# Patient Record
Sex: Female | Born: 1940
Health system: Southern US, Community
[De-identification: ages and names within clinical notes are randomized; demographics above are authoritative.]

## PROBLEM LIST (undated history)

## (undated) ENCOUNTER — Emergency Department (HOSPITAL_COMMUNITY): Admission: EM | Payer: Self-pay

## (undated) DIAGNOSIS — K449 Diaphragmatic hernia without obstruction or gangrene: Secondary | ICD-10-CM

## (undated) DIAGNOSIS — Z9981 Dependence on supplemental oxygen: Secondary | ICD-10-CM

## (undated) DIAGNOSIS — E7800 Pure hypercholesterolemia, unspecified: Secondary | ICD-10-CM

## (undated) DIAGNOSIS — J449 Chronic obstructive pulmonary disease, unspecified: Secondary | ICD-10-CM

## (undated) DIAGNOSIS — M899 Disorder of bone, unspecified: Secondary | ICD-10-CM

## (undated) DIAGNOSIS — Z9181 History of falling: Secondary | ICD-10-CM

## (undated) DIAGNOSIS — K635 Polyp of colon: Secondary | ICD-10-CM

## (undated) DIAGNOSIS — H409 Unspecified glaucoma: Secondary | ICD-10-CM

## (undated) DIAGNOSIS — M949 Disorder of cartilage, unspecified: Secondary | ICD-10-CM

## (undated) DIAGNOSIS — E079 Disorder of thyroid, unspecified: Secondary | ICD-10-CM

## (undated) DIAGNOSIS — F329 Major depressive disorder, single episode, unspecified: Secondary | ICD-10-CM

## (undated) DIAGNOSIS — F419 Anxiety disorder, unspecified: Secondary | ICD-10-CM

## (undated) DIAGNOSIS — F17200 Nicotine dependence, unspecified, uncomplicated: Secondary | ICD-10-CM

## (undated) HISTORY — DX: Disorder of bone, unspecified: M89.9

## (undated) HISTORY — PX: CHOLECYSTECTOMY: SHX55

## (undated) HISTORY — DX: History of falling: Z91.81

## (undated) HISTORY — PX: TONSILLECTOMY: SUR1361

## (undated) HISTORY — DX: Major depressive disorder, single episode, unspecified: F32.9

## (undated) HISTORY — PX: BLADDER AUGMENTATION: SHX1233

## (undated) HISTORY — PX: APPENDECTOMY: SHX54

## (undated) HISTORY — DX: Disorder of cartilage, unspecified: M94.9

## (undated) HISTORY — PX: ABDOMINAL HYSTERECTOMY: SHX81

## (undated) HISTORY — DX: Nicotine dependence, unspecified, uncomplicated: F17.200

## (undated) HISTORY — DX: Pure hypercholesterolemia, unspecified: E78.00

## (undated) HISTORY — PX: BACK SURGERY: SHX140

## (undated) HISTORY — DX: Polyp of colon: K63.5

---

## 2011-06-09 DIAGNOSIS — Z23 Encounter for immunization: Secondary | ICD-10-CM | POA: Diagnosis not present

## 2011-06-30 DIAGNOSIS — Z0000 Encounter for general adult medical examination without abnormal findings: Secondary | ICD-10-CM | POA: Diagnosis not present

## 2011-06-30 DIAGNOSIS — Z79899 Other long term (current) drug therapy: Secondary | ICD-10-CM | POA: Diagnosis not present

## 2011-06-30 DIAGNOSIS — E039 Hypothyroidism, unspecified: Secondary | ICD-10-CM | POA: Diagnosis not present

## 2011-06-30 DIAGNOSIS — Z0184 Encounter for antibody response examination: Secondary | ICD-10-CM | POA: Diagnosis not present

## 2011-06-30 DIAGNOSIS — F17200 Nicotine dependence, unspecified, uncomplicated: Secondary | ICD-10-CM | POA: Diagnosis not present

## 2011-06-30 DIAGNOSIS — J449 Chronic obstructive pulmonary disease, unspecified: Secondary | ICD-10-CM | POA: Diagnosis not present

## 2011-06-30 DIAGNOSIS — E7800 Pure hypercholesterolemia, unspecified: Secondary | ICD-10-CM | POA: Diagnosis not present

## 2011-08-10 DIAGNOSIS — H4011X0 Primary open-angle glaucoma, stage unspecified: Secondary | ICD-10-CM | POA: Diagnosis not present

## 2011-09-14 DIAGNOSIS — H2510 Age-related nuclear cataract, unspecified eye: Secondary | ICD-10-CM | POA: Diagnosis not present

## 2011-11-01 DIAGNOSIS — H2510 Age-related nuclear cataract, unspecified eye: Secondary | ICD-10-CM | POA: Diagnosis not present

## 2011-11-01 DIAGNOSIS — H269 Unspecified cataract: Secondary | ICD-10-CM | POA: Diagnosis not present

## 2011-11-02 DIAGNOSIS — H2510 Age-related nuclear cataract, unspecified eye: Secondary | ICD-10-CM | POA: Diagnosis not present

## 2011-11-22 DIAGNOSIS — H269 Unspecified cataract: Secondary | ICD-10-CM | POA: Diagnosis not present

## 2011-11-22 DIAGNOSIS — H2510 Age-related nuclear cataract, unspecified eye: Secondary | ICD-10-CM | POA: Diagnosis not present

## 2011-11-30 DIAGNOSIS — H2510 Age-related nuclear cataract, unspecified eye: Secondary | ICD-10-CM | POA: Diagnosis not present

## 2011-12-29 DIAGNOSIS — F341 Dysthymic disorder: Secondary | ICD-10-CM | POA: Diagnosis not present

## 2011-12-29 DIAGNOSIS — E039 Hypothyroidism, unspecified: Secondary | ICD-10-CM | POA: Diagnosis not present

## 2011-12-29 DIAGNOSIS — J449 Chronic obstructive pulmonary disease, unspecified: Secondary | ICD-10-CM | POA: Diagnosis not present

## 2011-12-29 DIAGNOSIS — R450 Nervousness: Secondary | ICD-10-CM | POA: Diagnosis not present

## 2011-12-29 DIAGNOSIS — M79609 Pain in unspecified limb: Secondary | ICD-10-CM | POA: Diagnosis not present

## 2012-02-05 DIAGNOSIS — Z23 Encounter for immunization: Secondary | ICD-10-CM | POA: Diagnosis not present

## 2012-02-09 DIAGNOSIS — F341 Dysthymic disorder: Secondary | ICD-10-CM | POA: Diagnosis not present

## 2012-04-01 ENCOUNTER — Encounter (HOSPITAL_COMMUNITY): Payer: Self-pay | Admitting: Vascular Surgery

## 2012-04-01 ENCOUNTER — Emergency Department (HOSPITAL_COMMUNITY)
Admission: EM | Admit: 2012-04-01 | Discharge: 2012-04-02 | Disposition: A | Discharge status: Home / Self Care | Payer: Medicare Other | Attending: Emergency Medicine | Admitting: Emergency Medicine

## 2012-04-01 ENCOUNTER — Other Ambulatory Visit: Payer: Self-pay

## 2012-04-01 ENCOUNTER — Emergency Department (HOSPITAL_COMMUNITY): Payer: Medicare Other

## 2012-04-01 DIAGNOSIS — Z87891 Personal history of nicotine dependence: Secondary | ICD-10-CM | POA: Insufficient documentation

## 2012-04-01 DIAGNOSIS — E039 Hypothyroidism, unspecified: Secondary | ICD-10-CM | POA: Diagnosis not present

## 2012-04-01 DIAGNOSIS — R079 Chest pain, unspecified: Secondary | ICD-10-CM

## 2012-04-01 DIAGNOSIS — R071 Chest pain on breathing: Secondary | ICD-10-CM | POA: Insufficient documentation

## 2012-04-01 DIAGNOSIS — H409 Unspecified glaucoma: Secondary | ICD-10-CM | POA: Insufficient documentation

## 2012-04-01 DIAGNOSIS — Z79899 Other long term (current) drug therapy: Secondary | ICD-10-CM | POA: Insufficient documentation

## 2012-04-01 DIAGNOSIS — Z7982 Long term (current) use of aspirin: Secondary | ICD-10-CM | POA: Diagnosis not present

## 2012-04-01 DIAGNOSIS — J4489 Other specified chronic obstructive pulmonary disease: Secondary | ICD-10-CM | POA: Insufficient documentation

## 2012-04-01 DIAGNOSIS — Z8719 Personal history of other diseases of the digestive system: Secondary | ICD-10-CM | POA: Diagnosis not present

## 2012-04-01 DIAGNOSIS — R0789 Other chest pain: Secondary | ICD-10-CM | POA: Diagnosis not present

## 2012-04-01 DIAGNOSIS — Z8659 Personal history of other mental and behavioral disorders: Secondary | ICD-10-CM | POA: Insufficient documentation

## 2012-04-01 DIAGNOSIS — J449 Chronic obstructive pulmonary disease, unspecified: Secondary | ICD-10-CM | POA: Insufficient documentation

## 2012-04-01 HISTORY — DX: Diaphragmatic hernia without obstruction or gangrene: K44.9

## 2012-04-01 HISTORY — DX: Anxiety disorder, unspecified: F41.9

## 2012-04-01 HISTORY — DX: Chronic obstructive pulmonary disease, unspecified: J44.9

## 2012-04-01 HISTORY — DX: Unspecified glaucoma: H40.9

## 2012-04-01 HISTORY — DX: Disorder of thyroid, unspecified: E07.9

## 2012-04-01 LAB — COMPREHENSIVE METABOLIC PANEL
AST: 18 U/L (ref 0–37)
Albumin: 3.6 g/dL (ref 3.5–5.2)
CO2: 26 mEq/L (ref 19–32)
Calcium: 9 mg/dL (ref 8.4–10.5)
Creatinine, Ser: 0.54 mg/dL (ref 0.50–1.10)
GFR calc non Af Amer: >90 mL/min (ref >90)

## 2012-04-01 LAB — CBC WITH DIFFERENTIAL/PLATELET
Eosinophils Absolute: 0.4 10*3/uL (ref 0.0–0.7)
Hemoglobin: 12.4 g/dL (ref 12.0–15.0)
Lymphs Abs: 1.7 10*3/uL (ref 0.7–4.0)
MCH: 30.6 pg (ref 26.0–34.0)
Monocytes Relative: 7 % (ref 3–12)
Neutrophils Relative %: 70 % (ref 43–77)
RBC: 4.05 MIL/uL (ref 3.87–5.11)

## 2012-04-01 NOTE — ED Notes (Addendum)
Pt denies c/o. Sob, blurred vision, n/v pt states while ambulating she gets "lightheaded"

## 2012-04-01 NOTE — ED Provider Notes (Signed)
This is a 71 year old female, who presents to the emergency department with chief complaint of chest pain. The patient has been transferred to the CDU and signed out to me by Dr. Preston Fleeting. The plan is for the patient to receive serial troponins, and then to be discharged if the blood work is negative at 0, 3, and 6 hours.  Patient states that she is not in any pain. She is resting comfortably. She denies any complaints whatsoever at this time.  PE: Gen: A&O x4 HEENT: PERRL, EOM CHEST: RRR, no m/r/g LUNGS: CTAB, no w/r/r ABD: BS x 4, ND/NT EXT: No edema, strong peripheral pulses NEURO: Sensation and strength intact bilaterally  12:10 AM Patient has been signed out to Dr. Lorenso Courier, who will continue care at this time.   Roxy Horseman, PA-C 04/02/12 0010

## 2012-04-01 NOTE — ED Notes (Signed)
Pt reports to the ED via [facility] for eval of chest burning that began after dinner. States that the pain went down both of her arms. Upon EMS arrival states that her pain had resolved. Pt also denies pain at this time. Denies and SOB, N/V, diaphoresis, she does report dizziness and lightheadedness. Per EMS she had ST depression in leads 3-6. Pt had 324 of ASA and was on 2 L of oxygen upon arrival.

## 2012-04-01 NOTE — ED Provider Notes (Signed)
History     CSN: [id number]  Arrival date & time 04/01/12  2036   First MD Initiated Contact with Patient 04/01/12 2046      Chief Complaint  Patient presents with  . Chest Pain    (Consider location/radiation/quality/duration/timing/severity/associated sxs/prior treatment) Patient is a 71 y.o. female presenting with chest pain. The history is provided by the patient.  Chest Pain   She had onset about one hour ago of a burning pain in the anterior chest. Onset was while she was watching television. Pain came in waves and did radiate into both arms but not the back or neck or jaw. There is no associated dyspnea, nausea, diaphoresis. She did complain of some momentary dizziness. She took aspirin 325 mg and following that, all symptoms went away completely. She's not had any recurrence of pain. At its worst, pain was rated at 6/10. She quit smoking 5 months ago. There is a strong family history of coronary disease with her father having died of a heart attack in his late 86s. She denies history of diabetes or hypertension or hyperlipidemia, but does have history of COPD.  Past Medical History  Diagnosis Date  . Thyroid disease hypothyroidism  . COPD (chronic obstructive pulmonary disease)   . Glaucoma   . Anxiety   . Hiatal hernia     Past Surgical History  Procedure Date  . Tonsillectomy   . Appendectomy   . Cholecystectomy   . Abdominal hysterectomy   . Bladder augmentation     Family History  Problem Relation Age of Onset  . Dementia Mother   . Heart attack Father     History  Substance Use Topics  . Smoking status: Former Smoker    Types: Cigarettes    Quit date: 11/22/2011  . Smokeless tobacco: Not on file  . Alcohol Use: No    OB History    Grav Para Term Preterm Abortions TAB SAB Ect Mult Living                  Review of Systems  Cardiovascular: Positive for chest pain.  All other systems reviewed and are negative.    Allergies  Codeine; Sulfa  antibiotics; and Penicillins  Home Medications   Current Outpatient Rx  Name  Route  Sig  Dispense  Refill  . ASPIRIN 325 MG PO TABS   Oral   Take 325 mg by mouth once.         . ASPIRIN EC 81 MG PO TBEC   Oral   Take 81 mg by mouth daily.         Marland Kitchen CITALOPRAM HYDROBROMIDE 20 MG PO TABS   Oral   Take 20 mg by mouth daily.         . IPRATROPIUM-ALBUTEROL 0.5-2.5 (3) MG/3ML IN SOLN   Nebulization   Take 3 mLs by nebulization every 8 (eight) hours as needed. For difficulty breathing         . LATANOPROST 0.005 % OP SOLN   Both Eyes   Place 1 drop into both eyes at bedtime.         Marland Kitchen LEVOTHYROXINE SODIUM 88 MCG PO TABS   Oral   Take 88 mcg by mouth daily.         Marygrace Drought WOMENS 50+ ADVANTAGE PO   Oral   Take 1 tablet by mouth daily.           BP 129/74  Pulse 76  Temp 97.9 F (  36.6 C) (Oral)  Resp 20  SpO2 97%  Physical Exam  Nursing note and vitals reviewed. 71 year old female, resting comfortably and in no acute distress. Vital signs are normal. Oxygen saturation is 97%, which is normal. Head is normocephalic and atraumatic. PERRLA, EOMI. Oropharynx is clear. Neck is nontender and supple without adenopathy or JVD. Back is nontender and there is no CVA tenderness. Lungs have decreased breath sounds at the right base and slightly decreased breath sounds throughout the right lung compared with the left. There are no rales, wheezes, or rhonchi. Chest is nontender. Heart has regular rate and rhythm without murmur. Abdomen is soft, flat, nontender without masses or hepatosplenomegaly and peristalsis is normoactive. Extremities have no cyanosis or edema, full range of motion is present. Skin is warm and dry without rash. Neurologic: Mental status is normal, cranial nerves are intact, there are no motor or sensory deficits.   ED Course  Procedures (including critical care time)  Results for orders placed during the hospital encounter of 04/01/12    COMPREHENSIVE METABOLIC PANEL      Component Value Range   Sodium 138  135 - 145 mEq/L   Potassium 3.5  3.5 - 5.1 mEq/L   Chloride 104  96 - 112 mEq/L   CO2 26  19 - 32 mEq/L   Glucose, Bld 110 (*) 70 - 99 mg/dL   BUN 18  6 - 23 mg/dL   Creatinine, Ser 1.61  0.50 - 1.10 mg/dL   Calcium 9.0  8.4 - 09.6 mg/dL   Total Protein 6.6  6.0 - 8.3 g/dL   Albumin 3.6  3.5 - 5.2 g/dL   AST 18  0 - 37 U/L   ALT 12  0 - 35 U/L   Alkaline Phosphatase 76  39 - 117 U/L   Total Bilirubin 0.2 (*) 0.3 - 1.2 mg/dL   GFR calc non Af Amer >90  >90 mL/min   GFR calc Af Amer >90  >90 mL/min  CBC WITH DIFFERENTIAL      Component Value Range   WBC 9.2  4.0 - 10.5 K/uL   RBC 4.05  3.87 - 5.11 MIL/uL   Hemoglobin 12.4  12.0 - 15.0 g/dL   HCT 04.5  40.9 - 81.1 %   MCV 92.1  78.0 - 100.0 fL   MCH 30.6  26.0 - 34.0 pg   MCHC 33.2  30.0 - 36.0 g/dL   RDW 91.4  78.2 - 95.6 %   Platelets 212  150 - 400 K/uL   Neutrophils Relative 70  43 - 77 %   Neutro Abs 6.5  1.7 - 7.7 K/uL   Lymphocytes Relative 19  12 - 46 %   Lymphs Abs 1.7  0.7 - 4.0 K/uL   Monocytes Relative 7  3 - 12 %   Monocytes Absolute 0.7  0.1 - 1.0 K/uL   Eosinophils Relative 4  0 - 5 %   Eosinophils Absolute 0.4  0.0 - 0.7 K/uL   Basophils Relative 1  0 - 1 %   Basophils Absolute 0.1  0.0 - 0.1 K/uL  LIPASE, BLOOD      Component Value Range   Lipase 25  11 - 59 U/L  TROPONIN I      Component Value Range   Troponin I <0.30  <0.30 ng/mL  TROPONIN I      Component Value Range   Troponin I <0.30  <0.30 ng/mL  TROPONIN I  Component Value Range   Troponin I <0.30  <0.30 ng/mL   Dg Chest 2 View  04/01/2012  *RADIOLOGY REPORT*  Clinical Data: Burning pain within the mid chest for 2 hours  CHEST - 2 VIEW  Comparison: None.  Findings: Normal cardiac silhouette and mediastinal contours.  The lungs are hyperexpanded with flattening of bilateral hemidiaphragms.  Grossly symmetric biapical pleural parenchymal thickening.  Mild diffuse  thickening of the pulmonary interstitium. No evidence of pulmonary edema.  No focal airspace opacities.  No pleural effusion or pneumothorax.  Accentuated thoracic kyphosis. Post cholecystectomy.  IMPRESSION: Hyperexpanded lungs without acute cardiopulmonary disease.   Original Report Authenticated By: Tacey Ruiz, MD       Date: 04/01/2012  Rate: 79  Rhythm: normal sinus rhythm  QRS Axis: normal  Intervals: normal  ST/T Wave abnormalities: nonspecific ST changes  Conduction Disutrbances:none  Narrative Interpretation: Minor nonspecific ST changes. No prior ECG available for comparison.  Old EKG Reviewed: none available    1. Chest pain       MDM   chest pain which might be cardiac in. Since she is pain-free he does not show any ECG changes, she will be watched in the ED and 6 serial troponins checked. If troponin is negative after 6 hours, she will be referred to [facility] cardiology for outpatient workup.        Dione Booze, MD 04/03/12 [phone]

## 2012-04-02 NOTE — ED Provider Notes (Signed)
Received sign-over at midnight.  Pt was awaiting the results of a 3rd troponin for evaluation of chest pain.  The 3rd trop is also not elevated.  She is pain free.  Plan discharge home to follow-up closely as an outpt.  Tobin Chad, MD 04/02/12 [phone]

## 2012-04-02 NOTE — ED Provider Notes (Signed)
Medical screening examination/treatment/procedure(s) were conducted as a shared visit with non-physician practitioner(s) and myself.  I personally evaluated the patient during the encounter   Dione Booze, MD 04/02/12 [phone]

## 2012-04-05 DIAGNOSIS — J449 Chronic obstructive pulmonary disease, unspecified: Secondary | ICD-10-CM | POA: Diagnosis not present

## 2012-04-05 DIAGNOSIS — F341 Dysthymic disorder: Secondary | ICD-10-CM | POA: Diagnosis not present

## 2012-04-05 DIAGNOSIS — K449 Diaphragmatic hernia without obstruction or gangrene: Secondary | ICD-10-CM | POA: Diagnosis not present

## 2012-04-05 DIAGNOSIS — R079 Chest pain, unspecified: Secondary | ICD-10-CM | POA: Diagnosis not present

## 2012-04-25 DIAGNOSIS — H4011X0 Primary open-angle glaucoma, stage unspecified: Secondary | ICD-10-CM | POA: Diagnosis not present

## 2012-06-08 DIAGNOSIS — K5289 Other specified noninfective gastroenteritis and colitis: Secondary | ICD-10-CM | POA: Diagnosis not present

## 2012-08-10 DIAGNOSIS — H4011X0 Primary open-angle glaucoma, stage unspecified: Secondary | ICD-10-CM | POA: Diagnosis not present

## 2012-09-12 DIAGNOSIS — S8000XA Contusion of unspecified knee, initial encounter: Secondary | ICD-10-CM | POA: Diagnosis not present

## 2012-09-12 DIAGNOSIS — S7000XA Contusion of unspecified hip, initial encounter: Secondary | ICD-10-CM | POA: Diagnosis not present

## 2012-11-03 DIAGNOSIS — E039 Hypothyroidism, unspecified: Secondary | ICD-10-CM | POA: Diagnosis not present

## 2012-11-03 DIAGNOSIS — F329 Major depressive disorder, single episode, unspecified: Secondary | ICD-10-CM | POA: Diagnosis not present

## 2012-11-03 DIAGNOSIS — Z780 Asymptomatic menopausal state: Secondary | ICD-10-CM | POA: Diagnosis not present

## 2012-11-03 DIAGNOSIS — Z131 Encounter for screening for diabetes mellitus: Secondary | ICD-10-CM | POA: Diagnosis not present

## 2012-11-03 DIAGNOSIS — J449 Chronic obstructive pulmonary disease, unspecified: Secondary | ICD-10-CM | POA: Diagnosis not present

## 2012-11-03 DIAGNOSIS — Z87891 Personal history of nicotine dependence: Secondary | ICD-10-CM | POA: Diagnosis not present

## 2012-11-03 DIAGNOSIS — Z9181 History of falling: Secondary | ICD-10-CM | POA: Diagnosis not present

## 2012-11-03 DIAGNOSIS — Z0000 Encounter for general adult medical examination without abnormal findings: Secondary | ICD-10-CM | POA: Diagnosis not present

## 2012-11-03 DIAGNOSIS — E7800 Pure hypercholesterolemia, unspecified: Secondary | ICD-10-CM | POA: Diagnosis not present

## 2012-11-09 DIAGNOSIS — H4011X0 Primary open-angle glaucoma, stage unspecified: Secondary | ICD-10-CM | POA: Diagnosis not present

## 2012-11-15 ENCOUNTER — Other Ambulatory Visit: Payer: Self-pay

## 2012-11-15 DIAGNOSIS — Z1231 Encounter for screening mammogram for malignant neoplasm of breast: Secondary | ICD-10-CM

## 2012-11-28 ENCOUNTER — Ambulatory Visit
Admission: RE | Admit: 2012-11-28 | Discharge: 2012-11-28 | Disposition: A | Discharge status: Home / Self Care | Payer: Medicare Other | Source: Ambulatory Visit

## 2012-11-28 DIAGNOSIS — Z1231 Encounter for screening mammogram for malignant neoplasm of breast: Secondary | ICD-10-CM

## 2012-12-07 DIAGNOSIS — Z780 Asymptomatic menopausal state: Secondary | ICD-10-CM | POA: Diagnosis not present

## 2012-12-07 DIAGNOSIS — M899 Disorder of bone, unspecified: Secondary | ICD-10-CM | POA: Diagnosis not present

## 2012-12-07 DIAGNOSIS — E2839 Other primary ovarian failure: Secondary | ICD-10-CM | POA: Diagnosis not present

## 2012-12-15 DIAGNOSIS — K644 Residual hemorrhoidal skin tags: Secondary | ICD-10-CM | POA: Diagnosis not present

## 2012-12-15 DIAGNOSIS — Z09 Encounter for follow-up examination after completed treatment for conditions other than malignant neoplasm: Secondary | ICD-10-CM | POA: Diagnosis not present

## 2012-12-15 DIAGNOSIS — Z86010 Personal history of colonic polyps: Secondary | ICD-10-CM | POA: Diagnosis not present

## 2012-12-15 DIAGNOSIS — K5730 Diverticulosis of large intestine without perforation or abscess without bleeding: Secondary | ICD-10-CM | POA: Diagnosis not present

## 2013-01-17 DIAGNOSIS — J449 Chronic obstructive pulmonary disease, unspecified: Secondary | ICD-10-CM | POA: Diagnosis not present

## 2013-01-17 DIAGNOSIS — M899 Disorder of bone, unspecified: Secondary | ICD-10-CM | POA: Diagnosis not present

## 2013-01-17 DIAGNOSIS — E039 Hypothyroidism, unspecified: Secondary | ICD-10-CM | POA: Diagnosis not present

## 2013-01-17 DIAGNOSIS — F329 Major depressive disorder, single episode, unspecified: Secondary | ICD-10-CM | POA: Diagnosis not present

## 2013-01-17 DIAGNOSIS — Z23 Encounter for immunization: Secondary | ICD-10-CM | POA: Diagnosis not present

## 2013-01-17 DIAGNOSIS — Z87891 Personal history of nicotine dependence: Secondary | ICD-10-CM | POA: Diagnosis not present

## 2013-02-07 DIAGNOSIS — H4011X0 Primary open-angle glaucoma, stage unspecified: Secondary | ICD-10-CM | POA: Diagnosis not present

## 2013-02-16 DIAGNOSIS — R300 Dysuria: Secondary | ICD-10-CM | POA: Diagnosis not present

## 2013-02-16 DIAGNOSIS — N390 Urinary tract infection, site not specified: Secondary | ICD-10-CM | POA: Diagnosis not present

## 2013-05-08 DIAGNOSIS — F329 Major depressive disorder, single episode, unspecified: Secondary | ICD-10-CM | POA: Diagnosis not present

## 2013-05-08 DIAGNOSIS — M899 Disorder of bone, unspecified: Secondary | ICD-10-CM | POA: Diagnosis not present

## 2013-05-08 DIAGNOSIS — J449 Chronic obstructive pulmonary disease, unspecified: Secondary | ICD-10-CM | POA: Diagnosis not present

## 2013-05-08 DIAGNOSIS — Z87891 Personal history of nicotine dependence: Secondary | ICD-10-CM | POA: Diagnosis not present

## 2013-05-08 DIAGNOSIS — E039 Hypothyroidism, unspecified: Secondary | ICD-10-CM | POA: Diagnosis not present

## 2013-05-08 DIAGNOSIS — H409 Unspecified glaucoma: Secondary | ICD-10-CM | POA: Diagnosis not present

## 2013-07-04 DIAGNOSIS — H4011X0 Primary open-angle glaucoma, stage unspecified: Secondary | ICD-10-CM | POA: Diagnosis not present

## 2013-08-16 DIAGNOSIS — J449 Chronic obstructive pulmonary disease, unspecified: Secondary | ICD-10-CM | POA: Diagnosis not present

## 2013-08-16 DIAGNOSIS — G250 Essential tremor: Secondary | ICD-10-CM | POA: Diagnosis not present

## 2013-08-16 DIAGNOSIS — R279 Unspecified lack of coordination: Secondary | ICD-10-CM | POA: Diagnosis not present

## 2013-08-16 DIAGNOSIS — R259 Unspecified abnormal involuntary movements: Secondary | ICD-10-CM | POA: Diagnosis not present

## 2013-08-16 DIAGNOSIS — R29818 Other symptoms and signs involving the nervous system: Secondary | ICD-10-CM | POA: Diagnosis not present

## 2013-08-16 DIAGNOSIS — E039 Hypothyroidism, unspecified: Secondary | ICD-10-CM | POA: Diagnosis not present

## 2013-08-16 DIAGNOSIS — F329 Major depressive disorder, single episode, unspecified: Secondary | ICD-10-CM | POA: Diagnosis not present

## 2013-08-21 ENCOUNTER — Encounter (INDEPENDENT_AMBULATORY_CARE_PROVIDER_SITE_OTHER): Payer: Self-pay

## 2013-08-21 ENCOUNTER — Ambulatory Visit (INDEPENDENT_AMBULATORY_CARE_PROVIDER_SITE_OTHER): Payer: Medicare Other | Admitting: Neurology

## 2013-08-21 ENCOUNTER — Encounter: Payer: Self-pay | Admitting: Neurology

## 2013-08-21 VITALS — BP 141/72 | HR 86 | Temp 97.1°F | Ht 63.0 in | Wt 154.0 lb

## 2013-08-21 DIAGNOSIS — R251 Tremor, unspecified: Secondary | ICD-10-CM

## 2013-08-21 DIAGNOSIS — R29818 Other symptoms and signs involving the nervous system: Secondary | ICD-10-CM | POA: Diagnosis not present

## 2013-08-21 DIAGNOSIS — R2689 Other abnormalities of gait and mobility: Secondary | ICD-10-CM

## 2013-08-21 DIAGNOSIS — Z9181 History of falling: Secondary | ICD-10-CM

## 2013-08-21 DIAGNOSIS — R259 Unspecified abnormal involuntary movements: Secondary | ICD-10-CM

## 2013-08-21 DIAGNOSIS — J4489 Other specified chronic obstructive pulmonary disease: Secondary | ICD-10-CM

## 2013-08-21 DIAGNOSIS — J449 Chronic obstructive pulmonary disease, unspecified: Secondary | ICD-10-CM | POA: Diagnosis not present

## 2013-08-21 NOTE — Patient Instructions (Addendum)
  Please remember, that any kind of tremor may be exacerbated by anxiety, anger, nervousness, excitement, dehydration, sleep deprivation, by caffeine, and low blood sugar values or blood sugar fluctuations. Some medications, especially some antidepressants and lithium can cause or exacerbate tremors. Tremors may temporarily calm down her subside with the use of a benzodiazepine such as Valium or related medications and with alcohol. Be aware however that drinking alcohol is not an approved treatment or appropriate treatment for tremor control and long-term use of benzodiazepines such as Valium, lorazepam, alprazolam, or clonazepam can cause habit formation, physical and psychological addiction.  Please reduce your caffeine intake. Start using a cane for safety. We will do a brain MRI and call you with the test results.

## 2013-08-21 NOTE — Progress Notes (Signed)
Subjective:    Patient ID: Brittany Crosby is a 73 y.o. female.  HPI    Star Age, MD, PhD [facility] [address], [address] [place], [place] [address]  Dear Dr. Sabra Heck,   I saw your patient, Brittany Crosby, upon your kind request in my neurologic clinic today for initial consultation of her tremors. The patient is unaccompanied today. As you know, [name] is a 73 year old right-handed woman with an underlying medical history of lower back pain with history of herniated disc, hyperlipidemia, glaucoma, cataracts, hiatal hernia, depression, anxiety, hypertension, osteopenia, and COPD in the context of prior smoking (quit some 3 years ago),  status post tonsillectomy, appendectomy, cholecystectomy, bladder surgery and abdominal hysterectomy, who reports a one year history of gradual onset and progressive R hand tremor, affecting her handwriting (which has become smaller) and her feeding skills. Some months later, she noted a R foot tremor. She has difficulty getting out of a chair. She feels, her balance is worse. She has fallen about 4 times in the last year, but only went to the doctor after the second fall. No LOC, no head injury is reported. She reports no FHx of PD, but MGM had MS and daughter has dystonia. Her mother had a head tremor. She denies exposure to pesticides or other harsh chemicals. She worked in [employer]. She does not drink alcohol. She does drink sweet tea and 3 cans of cola per day. She has been on Celexa for the past 2 years. She has no history of neuroleptic medication use or Reglan. She had blood work in your office on 08/16/13: CBC with diff, TSH, ESR and B12 with results pending. She has never had a brain scan.  Her Past Medical History Is Significant For: Past Medical History  Diagnosis Date  . Thyroid disease hypothyroidism  . COPD (chronic obstructive pulmonary disease)   . Glaucoma   . Anxiety   . Hiatal hernia    . Disorder of bone and cartilage, unspecified   . Major depressive disorder, single episode, unspecified    Her Past Surgical History Is Significant For: Past Surgical History  Procedure Laterality Date  . Tonsillectomy    . Appendectomy    . Cholecystectomy    . Abdominal hysterectomy    . Bladder augmentation      Her Family History Is Significant For: Family History  Problem Relation Age of Onset  . Dementia Mother   . Heart attack Father     Her Social History Is Significant For: History   Social History  . Marital Status: Single    Spouse Name: N/A    Number of Children: N/A  . Years of Education: N/A   Social History Main Topics  . Smoking status: Former Smoker    Types: Cigarettes    Quit date: 11/22/2011  . Smokeless tobacco: None  . Alcohol Use: No  . Drug Use: No  . Sexual Activity: Yes    Birth Control/ Protection: Surgical   Other Topics Concern  . None   Social History Narrative  . None    Her Allergies Are:  Allergies  Allergen Reactions  . Codeine Other (See Comments)    Abdominal pain  . Sulfa Antibiotics Swelling  . Penicillins Rash  :   Her Current Medications Are:  Outpatient Encounter Prescriptions as of 08/21/2013  Medication Sig  . aspirin EC 81 MG tablet Take 81 mg by mouth daily.  . citalopram (CELEXA) 20 MG tablet Take  20 mg by mouth daily.  Marland Kitchen ipratropium-albuterol (DUONEB) 0.5-2.5 (3) MG/3ML SOLN Take 3 mLs by nebulization every 8 (eight) hours as needed. For difficulty breathing  . latanoprost (XALATAN) 0.005 % ophthalmic solution Place 1 drop into both eyes at bedtime.  Marland Kitchen levothyroxine (SYNTHROID, LEVOTHROID) 88 MCG tablet Take 88 mcg by mouth daily.  . Multiple Vitamins-Minerals (ONE-A-DAY WOMENS 50+ ADVANTAGE PO) Take 1 tablet by mouth daily.  . [DISCONTINUED] aspirin 325 MG tablet Take 325 mg by mouth once.   Review of Systems:   Out of a complete 14 point review of systems, all are reviewed and negative with the  exception of these symptoms as listed below:   Review of Systems  HENT: Negative.   Eyes: Negative.   Respiratory: Negative.   Cardiovascular: Negative.   Gastrointestinal: Negative.   Endocrine: Negative.   Genitourinary: Negative.   Musculoskeletal: Negative.   Skin: Negative.   Allergic/Immunologic: Negative.   Neurological: Positive for tremors and weakness.       Memory loss  Hematological: Negative.   Psychiatric/Behavioral: Negative.     Objective:  Neurologic Exam  Physical Exam Physical Examination:   Filed Vitals:   08/21/13 0951  BP: 141/72  Pulse: 86  Temp: 97.1 F (36.2 C)   General Examination: The patient is a very pleasant 73 y.o. female in no acute distress.  HEENT: Normocephalic, atraumatic, pupils are equal, round and reactive to light and accommodation. Funduscopic exam is normal with sharp disc margins noted. Extraocular tracking shows no significant saccadic breakdown without nystagmus noted. There is no limitation to her gaze. There is no decrease in eye blink rate. Hearing is intact. Tympanic membranes are clear bilaterally. Face is symmetric with slight facial masking and normal facial sensation. There is no lip, neck or jaw tremor. Neck is not rigid with intact passive ROM. There are no carotid bruits on auscultation. Oropharynx exam reveals mild mouth dryness. Moderate airway crowding is noted, due to narrow airway and thick soft palate. She has dentures in the upper and is nearly edentulous in the lower. She  Mallampati is class II. Tongue protrudes centrally and palate elevates symmetrically. There is no drooling.   Chest: is clear to auscultation without wheezing, rhonchi or crackles noted.  Heart: sounds are regular and normal without murmurs, rubs or gallops noted.   Abdomen: is soft, non-tender and non-distended with normal bowel sounds appreciated on auscultation.  Extremities: There is trace pitting edema in the distal lower extremities  bilaterally. Pedal pulses are intact. There are no varicose veins.  Skin: is warm and dry with no trophic changes noted. Age-related changes are noted on the skin.   Musculoskeletal: exam reveals no obvious joint deformities, tenderness, joint swelling or erythema.  Neurologically:   Mental status: The patient is awake and alert, paying good  attention. She is able to completely provide the history. She is oriented to: person, place, time/date, situation, day of week, month of year and year. Her memory, attention, language and knowledge are intact. There is no aphasia, agnosia, apraxia or anomia. There is a no evidence of bradyphrenia. Speech is not hypophonic with no dysarthria noted. Mood is congruent and affect is normal.   Cranial nerves are as described above under HEENT exam. In addition, shoulder shrug is normal with equal shoulder height noted.  Motor exam: Normal bulk, and strength for age is noted. There are no dyskinesias noted.  Tone is not particularly rigid with absence of cogwheeling in the limbs. There is overall  very slight bradykinesia. There is no drift or rebound.  There is a slight resting tremor in the right upper extremity and a mild resting tremor in the right lower extremity. The tremor is intermittent. There is slight degree of distractibility to her tremor. The tremor seems just a little bit faster than the typical Parkinson's tremor. Handwriting is tremulous but not micrographic. Romberg is negative, but shows some swaying.  Reflexes are 2+ in the upper extremities and 2+ in the lower extremities. Toes are downgoing bilaterally.  Fine motor skills exam: Finger taps are Slightly impaired on the right and Not impaired on the left. Hand movements are Slightly impaired on the right and Not impaired on the left. RAP (rapid alternating patting) is Slightly impaired on the right and Not impaired on the left. Foot taps are mildly impaired on the right and not impaired on the  left. Foot agility (in the form of heel stomping) is mildly impaired on the right and Not impaired on the left.    Cerebellar testing shows no dysmetria or intention tremor on finger to nose testing. Heel to shin is unremarkable bilaterally. There is no truncal or gait ataxia.   Sensory exam is intact to light touch, pinprick, vibration, temperature sense in the upper and lower extremities.   Gait, station and balance: She stands up from the seated position with mild difficulty and does need to push up with Her hands. She needs no assistance, but takes 3 attempts. No veering to one side is noted. She is not noted to lean to the side. Posture is mildly stooped, possibly age-appropriate. Stance is narrow-based. She walks with decrease in stride length and pace and decreased arm swing on the right. However, later, I notice that her arm swing on the right when she walks carrying her him back on the left side. She turns in 3 steps. Tandem walk is not possible. Balance is Very mildly impaired. She is able to do a toe or heel stance.      Assessment and Plan:   In summary, Aolani Piggott is a very pleasant 73 y.o.-year old female with an underlying medical history of lower back pain with history of herniated disc, hyperlipidemia, glaucoma, cataracts, hiatal hernia, depression, anxiety, hypertension, osteopenia, and COPD in the context of prioromi smoking (quit some 3 years ago),  status post tonsillectomy, appendectomy, cholecystectomy, bladder surgery and abdominal hysterectomy, who reports a one year history of gradual onset and progressive R sided tremor in the upper and lower extremities. While she does have lateralizing findings on the right side, there are some findings that I do not believe are telltale for parkinsonism. She has a somewhat faster tremor. She has not a whole lot of increase in tone, and there is a slight degree of variability in her exam. Nevertheless we cannot fully exclude that she has  a mild form of parkinsonism or even right-sided predominant Parkinson's disease. I think we have to observe a little bit more. In the interim, I would like to do a brain MRI with and without contrast. I would also like to see her reduce her caffeine intake as caffeine can exacerbate tremors. Also she is on a nebulizer 3 times a day which can exacerbate tremors. Also, sometimes an SSRI type antidepressants can bring on tremors. It would be unusual to bring on an asymmetrical tremor. At this juncture, I advised the patient that she does have a tremor but I do not think we should commit to the diagnosis of  parkinsonism or Parkinson's disease at this time. She is advised that I would not recommend any new medications at this moment. I would like to see her back in 3 months and in the interim we will call her with her MRI results.   Thank you very much for allowing me to participate in the care of this nice patient. If I can be of any further assistance to you please do not hesitate to call me at [phone].  Sincerely,   Star Age, MD, PhD

## 2013-08-27 ENCOUNTER — Encounter: Payer: Self-pay | Admitting: Neurology

## 2013-08-27 DIAGNOSIS — R29818 Other symptoms and signs involving the nervous system: Secondary | ICD-10-CM | POA: Diagnosis not present

## 2013-08-27 DIAGNOSIS — M25559 Pain in unspecified hip: Secondary | ICD-10-CM | POA: Diagnosis not present

## 2013-08-27 DIAGNOSIS — R262 Difficulty in walking, not elsewhere classified: Secondary | ICD-10-CM | POA: Diagnosis not present

## 2013-08-30 DIAGNOSIS — R262 Difficulty in walking, not elsewhere classified: Secondary | ICD-10-CM | POA: Diagnosis not present

## 2013-08-30 DIAGNOSIS — R29818 Other symptoms and signs involving the nervous system: Secondary | ICD-10-CM | POA: Diagnosis not present

## 2013-08-30 DIAGNOSIS — M25559 Pain in unspecified hip: Secondary | ICD-10-CM | POA: Diagnosis not present

## 2013-09-04 DIAGNOSIS — R262 Difficulty in walking, not elsewhere classified: Secondary | ICD-10-CM | POA: Diagnosis not present

## 2013-09-04 DIAGNOSIS — M25559 Pain in unspecified hip: Secondary | ICD-10-CM | POA: Diagnosis not present

## 2013-09-04 DIAGNOSIS — R29818 Other symptoms and signs involving the nervous system: Secondary | ICD-10-CM | POA: Diagnosis not present

## 2013-09-06 DIAGNOSIS — R29818 Other symptoms and signs involving the nervous system: Secondary | ICD-10-CM | POA: Diagnosis not present

## 2013-09-06 DIAGNOSIS — M25559 Pain in unspecified hip: Secondary | ICD-10-CM | POA: Diagnosis not present

## 2013-09-06 DIAGNOSIS — R262 Difficulty in walking, not elsewhere classified: Secondary | ICD-10-CM | POA: Diagnosis not present

## 2013-09-07 ENCOUNTER — Inpatient Hospital Stay: Admission: RE | Admit: 2013-09-07 | Payer: Medicare Other | Source: Ambulatory Visit

## 2013-09-12 ENCOUNTER — Other Ambulatory Visit: Payer: Medicare Other

## 2013-09-17 ENCOUNTER — Ambulatory Visit
Admission: RE | Admit: 2013-09-17 | Discharge: 2013-09-17 | Disposition: A | Discharge status: Home / Self Care | Payer: Medicare Other | Source: Ambulatory Visit | Attending: Neurology | Admitting: Neurology

## 2013-09-17 DIAGNOSIS — R251 Tremor, unspecified: Secondary | ICD-10-CM

## 2013-09-17 DIAGNOSIS — Z9181 History of falling: Secondary | ICD-10-CM

## 2013-09-17 DIAGNOSIS — R42 Dizziness and giddiness: Secondary | ICD-10-CM | POA: Diagnosis not present

## 2013-09-17 DIAGNOSIS — R259 Unspecified abnormal involuntary movements: Secondary | ICD-10-CM | POA: Diagnosis not present

## 2013-09-17 MED ORDER — GADOBENATE DIMEGLUMINE 529 MG/ML IV SOLN
14.0000 mL | Freq: Once | INTRAVENOUS | Status: AC | PRN
  Administered 2013-09-17: 14 mL via INTRAVENOUS

## 2013-09-18 DIAGNOSIS — R262 Difficulty in walking, not elsewhere classified: Secondary | ICD-10-CM | POA: Diagnosis not present

## 2013-09-18 DIAGNOSIS — M25559 Pain in unspecified hip: Secondary | ICD-10-CM | POA: Diagnosis not present

## 2013-09-18 DIAGNOSIS — R29818 Other symptoms and signs involving the nervous system: Secondary | ICD-10-CM | POA: Diagnosis not present

## 2013-09-19 NOTE — Progress Notes (Signed)
Quick Note:  Please call patient regarding the recent brain MRI: The brain scan showed a normal structure of the brain and no significant volume loss which we call atrophy. There were changes in the deeper structures of the brain, which we call white matter changes or microvascular changes. These were reported as mild in Her case. These are tiny white spots, that occur with time and are seen in a variety of conditions, including with normal aging, chronic hypertension, chronic headaches, especially migraine HAs, chronic diabetes, chronic hyperlipidemia. These are not strokes and no mass or lesion or contrast enhancement was seen which is reassuring. Again, there were no acute findings, such as a stroke, or mass or blood products. No further action is required on this test at this time, other than re-enforcing the importance of good blood pressure control, good cholesterol control, good blood sugar control, and weight management. Please remind patient to keep any upcoming appointments or tests and to call us with any interim questions, concerns, problems or updates. Thanks,  Isys Tietje, MD, PhD    ______ 

## 2013-09-20 ENCOUNTER — Telehealth: Payer: Self-pay | Admitting: *Deleted

## 2013-09-20 DIAGNOSIS — M25559 Pain in unspecified hip: Secondary | ICD-10-CM | POA: Diagnosis not present

## 2013-09-20 DIAGNOSIS — R262 Difficulty in walking, not elsewhere classified: Secondary | ICD-10-CM | POA: Diagnosis not present

## 2013-09-20 DIAGNOSIS — R251 Tremor, unspecified: Secondary | ICD-10-CM

## 2013-09-20 DIAGNOSIS — R29818 Other symptoms and signs involving the nervous system: Secondary | ICD-10-CM | POA: Diagnosis not present

## 2013-09-20 MED ORDER — GABAPENTIN 100 MG PO CAPS: ORAL_CAPSULE | ORAL | Status: DC

## 2013-09-20 NOTE — Telephone Encounter (Signed)
Patient was given her MRI results and wants to know how is she to control the tremors?  Please advise.

## 2013-09-20 NOTE — Telephone Encounter (Signed)
Called pt to inform her per Dr. Frances FurbishAthar that the Rx for Neurontin has been sent to her pharmacy and that the doctor wanted the pt to start taking gabapentin 100 mg, take 1 pill at bedtime for 1 wk, then 2 pills at bedtime for 1 wk and then 3 pills at bedtime thereafter and I explain the common side effects were sedation or sleepiness and rare side effect are balance problems, confusion. I advised the pt the if she has any other problems, questions or concerns to call the office. Pt verbalized understanding.

## 2013-09-20 NOTE — Telephone Encounter (Signed)
For now, let's try to reduce caffeine intake - has she tried that yet? If the tremor is still bothersome after that, we can try a small dose of gabapentin. Please advise patient.

## 2013-09-20 NOTE — Telephone Encounter (Signed)
Called pt to inform her per Dr. Frances FurbishAthar to try and reduce caffeine intake, pt stated that she has already did that and it has not helped. I also informed the pt per Dr. Frances FurbishAthar that the doctor would like to start the pt on a low dose of gabapentin and pt stated that she would like to try that. Please advise

## 2013-09-20 NOTE — Telephone Encounter (Signed)
I am sending Rx of Neurontin to her pharmacy. Please explain titration: Start Neurontin (gabapentin) 100 mg strength: Take 1 pill nightly at bedtime for 1 week, then 2 pills nightly for 1 week, then 3 pills each night thereafter. The most common side effects reported are sedation or sleepiness. Rare side effects include balance problems, confusion.

## 2013-09-24 DIAGNOSIS — R262 Difficulty in walking, not elsewhere classified: Secondary | ICD-10-CM | POA: Diagnosis not present

## 2013-09-24 DIAGNOSIS — M25559 Pain in unspecified hip: Secondary | ICD-10-CM | POA: Diagnosis not present

## 2013-09-24 DIAGNOSIS — R29818 Other symptoms and signs involving the nervous system: Secondary | ICD-10-CM | POA: Diagnosis not present

## 2013-09-26 DIAGNOSIS — R262 Difficulty in walking, not elsewhere classified: Secondary | ICD-10-CM | POA: Diagnosis not present

## 2013-09-26 DIAGNOSIS — M25559 Pain in unspecified hip: Secondary | ICD-10-CM | POA: Diagnosis not present

## 2013-09-26 DIAGNOSIS — R29818 Other symptoms and signs involving the nervous system: Secondary | ICD-10-CM | POA: Diagnosis not present

## 2013-10-03 ENCOUNTER — Encounter: Payer: Self-pay | Admitting: Neurology

## 2013-10-03 DIAGNOSIS — H4011X0 Primary open-angle glaucoma, stage unspecified: Secondary | ICD-10-CM | POA: Diagnosis not present

## 2013-10-04 DIAGNOSIS — R262 Difficulty in walking, not elsewhere classified: Secondary | ICD-10-CM | POA: Diagnosis not present

## 2013-10-04 DIAGNOSIS — R29818 Other symptoms and signs involving the nervous system: Secondary | ICD-10-CM | POA: Diagnosis not present

## 2013-10-04 DIAGNOSIS — M25559 Pain in unspecified hip: Secondary | ICD-10-CM | POA: Diagnosis not present

## 2013-10-09 DIAGNOSIS — R29818 Other symptoms and signs involving the nervous system: Secondary | ICD-10-CM | POA: Diagnosis not present

## 2013-10-09 DIAGNOSIS — M25559 Pain in unspecified hip: Secondary | ICD-10-CM | POA: Diagnosis not present

## 2013-10-09 DIAGNOSIS — R262 Difficulty in walking, not elsewhere classified: Secondary | ICD-10-CM | POA: Diagnosis not present

## 2013-10-11 DIAGNOSIS — R29818 Other symptoms and signs involving the nervous system: Secondary | ICD-10-CM | POA: Diagnosis not present

## 2013-10-11 DIAGNOSIS — R262 Difficulty in walking, not elsewhere classified: Secondary | ICD-10-CM | POA: Diagnosis not present

## 2013-10-11 DIAGNOSIS — M25559 Pain in unspecified hip: Secondary | ICD-10-CM | POA: Diagnosis not present

## 2013-10-23 DIAGNOSIS — R05 Cough: Secondary | ICD-10-CM | POA: Diagnosis not present

## 2013-10-23 DIAGNOSIS — R059 Cough, unspecified: Secondary | ICD-10-CM | POA: Diagnosis not present

## 2013-10-23 DIAGNOSIS — J029 Acute pharyngitis, unspecified: Secondary | ICD-10-CM | POA: Diagnosis not present

## 2013-11-05 DIAGNOSIS — Z87891 Personal history of nicotine dependence: Secondary | ICD-10-CM | POA: Diagnosis not present

## 2013-11-05 DIAGNOSIS — E039 Hypothyroidism, unspecified: Secondary | ICD-10-CM | POA: Diagnosis not present

## 2013-11-05 DIAGNOSIS — H409 Unspecified glaucoma: Secondary | ICD-10-CM | POA: Diagnosis not present

## 2013-11-05 DIAGNOSIS — J449 Chronic obstructive pulmonary disease, unspecified: Secondary | ICD-10-CM | POA: Diagnosis not present

## 2013-11-05 DIAGNOSIS — M899 Disorder of bone, unspecified: Secondary | ICD-10-CM | POA: Diagnosis not present

## 2013-11-05 DIAGNOSIS — Z23 Encounter for immunization: Secondary | ICD-10-CM | POA: Diagnosis not present

## 2013-11-05 DIAGNOSIS — M949 Disorder of cartilage, unspecified: Secondary | ICD-10-CM | POA: Diagnosis not present

## 2013-11-05 DIAGNOSIS — Z9181 History of falling: Secondary | ICD-10-CM | POA: Diagnosis not present

## 2013-11-05 DIAGNOSIS — F329 Major depressive disorder, single episode, unspecified: Secondary | ICD-10-CM | POA: Diagnosis not present

## 2013-11-05 DIAGNOSIS — Z0000 Encounter for general adult medical examination without abnormal findings: Secondary | ICD-10-CM | POA: Diagnosis not present

## 2013-11-08 DIAGNOSIS — J449 Chronic obstructive pulmonary disease, unspecified: Secondary | ICD-10-CM | POA: Diagnosis not present

## 2013-11-13 DIAGNOSIS — J449 Chronic obstructive pulmonary disease, unspecified: Secondary | ICD-10-CM | POA: Diagnosis not present

## 2013-11-13 DIAGNOSIS — R0902 Hypoxemia: Secondary | ICD-10-CM | POA: Diagnosis not present

## 2013-11-14 ENCOUNTER — Other Ambulatory Visit (HOSPITAL_COMMUNITY): Payer: Self-pay | Admitting: Family Medicine

## 2013-11-14 ENCOUNTER — Other Ambulatory Visit (HOSPITAL_COMMUNITY): Payer: Self-pay

## 2013-11-14 DIAGNOSIS — J441 Chronic obstructive pulmonary disease with (acute) exacerbation: Secondary | ICD-10-CM

## 2013-11-21 ENCOUNTER — Ambulatory Visit (HOSPITAL_COMMUNITY)
Admission: RE | Admit: 2013-11-21 | Discharge: 2013-11-21 | Disposition: A | Discharge status: Home / Self Care | Payer: Medicare Other | Source: Ambulatory Visit | Attending: Family Medicine | Admitting: Family Medicine

## 2013-11-21 DIAGNOSIS — J441 Chronic obstructive pulmonary disease with (acute) exacerbation: Secondary | ICD-10-CM | POA: Diagnosis not present

## 2013-11-21 MED ORDER — ALBUTEROL SULFATE (2.5 MG/3ML) 0.083% IN NEBU
2.5000 mg | INHALATION_SOLUTION | Freq: Once | RESPIRATORY_TRACT | Status: AC
  Administered 2013-11-21: 2.5 mg via RESPIRATORY_TRACT

## 2013-11-22 LAB — PULMONARY FUNCTION TEST
DL/VA % PRED: 107 %
DL/VA: 4.88 ml/min/mmHg/L
DLCO UNC % PRED: 71 %
DLCO UNC: 15.46 ml/min/mmHg
DLCO cor % pred: 71 %
DLCO cor: 15.46 ml/min/mmHg
FEF 25-75 Post: 0.58 L/sec
FEF 25-75 Pre: 0.33 L/sec
FEF2575-%Change-Post: 77 %
FEF2575-%PRED-POST: 33 %
FEF2575-%Pred-Pre: 19 %
FEV1-%CHANGE-POST: 17 %
FEV1-%PRED-POST: 49 %
FEV1-%Pred-Pre: 42 %
FEV1-Post: 1.02 L
FEV1-Pre: 0.87 L
FEV1FVC-%CHANGE-POST: 0 %
FEV1FVC-%Pred-Pre: 71 %
FEV6-%Change-Post: 15 %
FEV6-%PRED-PRE: 59 %
FEV6-%Pred-Post: 68 %
FEV6-POST: 1.77 L
FEV6-PRE: 1.53 L
FEV6FVC-%CHANGE-POST: 0 %
FEV6FVC-%PRED-POST: 99 %
FEV6FVC-%PRED-PRE: 100 %
FVC-%Change-Post: 16 %
FVC-%PRED-PRE: 58 %
FVC-%Pred-Post: 68 %
FVC-PRE: 1.59 L
FVC-Post: 1.86 L
PRE FEV6/FVC RATIO: 96 %
Post FEV1/FVC ratio: 55 %
Post FEV6/FVC ratio: 95 %
Pre FEV1/FVC ratio: 54 %
RV % PRED: 100 %
RV: 2.13 L
TLC % pred: 85 %
TLC: 4.08 L

## 2013-11-26 ENCOUNTER — Other Ambulatory Visit: Payer: Self-pay

## 2013-11-26 DIAGNOSIS — Z1231 Encounter for screening mammogram for malignant neoplasm of breast: Secondary | ICD-10-CM

## 2013-12-07 ENCOUNTER — Ambulatory Visit
Admission: RE | Admit: 2013-12-07 | Discharge: 2013-12-07 | Disposition: A | Discharge status: Home / Self Care | Payer: Medicare Other | Source: Ambulatory Visit

## 2013-12-07 DIAGNOSIS — Z1231 Encounter for screening mammogram for malignant neoplasm of breast: Secondary | ICD-10-CM | POA: Diagnosis not present

## 2013-12-20 ENCOUNTER — Telehealth (HOSPITAL_COMMUNITY): Payer: Self-pay

## 2013-12-20 NOTE — Telephone Encounter (Signed)
Called to see if patient was interested in attending Pulmonary Rehab per referral from Dr. Sigmund HazelLisa Miller.  Patient is interested and is scheduled for orientation on 01/04/14.

## 2014-01-01 ENCOUNTER — Encounter: Payer: Self-pay | Admitting: Pulmonary Disease

## 2014-01-01 ENCOUNTER — Ambulatory Visit (INDEPENDENT_AMBULATORY_CARE_PROVIDER_SITE_OTHER): Payer: Medicare Other | Admitting: Pulmonary Disease

## 2014-01-01 VITALS — BP 128/70 | HR 80 | Ht 63.0 in | Wt 153.2 lb

## 2014-01-01 DIAGNOSIS — F17200 Nicotine dependence, unspecified, uncomplicated: Secondary | ICD-10-CM

## 2014-01-01 DIAGNOSIS — J449 Chronic obstructive pulmonary disease, unspecified: Secondary | ICD-10-CM | POA: Diagnosis not present

## 2014-01-01 DIAGNOSIS — J438 Other emphysema: Secondary | ICD-10-CM | POA: Diagnosis not present

## 2014-01-01 DIAGNOSIS — H409 Unspecified glaucoma: Secondary | ICD-10-CM

## 2014-01-01 DIAGNOSIS — J411 Mucopurulent chronic bronchitis: Secondary | ICD-10-CM

## 2014-01-01 DIAGNOSIS — Z720 Tobacco use: Secondary | ICD-10-CM

## 2014-01-01 MED ORDER — AEROCHAMBER MV MISC: Status: AC

## 2014-01-01 NOTE — Patient Instructions (Signed)
Take the symbicort one puff no matter how you feel twice a day Use the nebulizer as needed Talk to your eye doctor about the Symbicort We will see you back in 6 weeks or sooner if needed

## 2014-01-01 NOTE — Assessment & Plan Note (Signed)
COPD: GOLD Grade C, severe obstruction on Spirometry but moderate symptoms  Our biggest challenge here will be improving her symptoms with inhalers but not worsening her glaucoma.  I have advised that she try using Symbicort at one half of the lowest strength. I. advised her to start using it tonight and again tomorrow morning before she goes for a glaucoma check tomorrow.  -O2 therapy: Not indicated -Immunizations: Pneumonia shot is up-to-date, flu shot in the fall -Tobacco use: Quit 3 years ago -Exercise: Start pulmonary rehabilitation -Bronchodilator therapy: Start Symbicort 80/4.5 1 puff with spacer twice a day -Exacerbation prevention: N/A

## 2014-01-01 NOTE — Progress Notes (Signed)
Subjective:    Patient ID: Brittany Crosby, female    DOB: 1941/05/14, 73 y.o.   MRN: [id number]  HPI  She is here to see me for COPD and amphysema.  She was diagnosed with 2012 when she was living in [place].  She was sent for a umber of heart tests for chest pain and was found found to have COPD.  She was started on a nebulizer four times a day.  She then moved to [place] shortly thereafter.  Her doctor at [facility] decreased the dose of this to tid.  She has been started on 2L O2 in the evening at some point too.  She doesn't use oxygen during the day.  She feels symptoms from the COPD when she has to walk a lot, like up a hill or trying to keep up with her husband.  She feels like she can't do what she wants to do without difficulty.  She is moving in a few weeks and doing work in the house really bothers her.  She can carry in groceries but has to stop because of dyspnea.    She does nto cough much. Mostly after a nebulizer.  She used to smoke 1-1.5 PPD for thirty years.  She quit in 2012.  She smoked for 30 years.    Past Medical History  Diagnosis Date  . Thyroid disease hypothyroidism  . COPD (chronic obstructive pulmonary disease)   . Glaucoma   . Anxiety   . Hiatal hernia   . Disorder of bone and cartilage, unspecified   . Major depressive disorder, single episode, unspecified      Family History  Problem Relation Age of Onset  . Dementia Mother   . Heart attack Father      History   Social History  . Marital Status: Single    Spouse Name: N/A    Number of Children: N/A  . Years of Education: N/A   Occupational History  . retired    Social History Main Topics  . Smoking status: Former Smoker    Types: Cigarettes    Quit date: 11/22/2011  . Smokeless tobacco: Never Used  . Alcohol Use: No  . Drug Use: No  . Sexual Activity: Yes    Birth Control/ Protection: Surgical   Other Topics Concern  . Not on file   Social History Narrative  . No narrative on file      Allergies  Allergen Reactions  . Codeine Other (See Comments)    Abdominal pain  . Sulfa Antibiotics Swelling  . Penicillins Rash     Outpatient Prescriptions Prior to Visit  Medication Sig Dispense Refill  . aspirin EC 81 MG tablet Take 81 mg by mouth daily.      . citalopram (CELEXA) 20 MG tablet Take 20 mg by mouth daily.      Marland Kitchen gabapentin (NEURONTIN) 100 MG capsule Take 1 pill nightly at bedtime for 1 week, then 2 pills nightly for 1 week, then 3 pills each night thereafter.  90 capsule  5  . ipratropium-albuterol (DUONEB) 0.5-2.5 (3) MG/3ML SOLN Take 3 mLs by nebulization every 8 (eight) hours as needed. For difficulty breathing      . latanoprost (XALATAN) 0.005 % ophthalmic solution Place 1 drop into both eyes at bedtime.      Marland Kitchen levothyroxine (SYNTHROID, LEVOTHROID) 88 MCG tablet Take 88 mcg by mouth daily.      . Multiple Vitamins-Minerals (ONE-A-DAY WOMENS 50+ ADVANTAGE PO) Take 1 tablet by  mouth daily.       No facility-administered medications prior to visit.      Review of Systems  Constitutional: Negative for fever and unexpected weight change.  HENT: Positive for congestion. Negative for dental problem, ear pain, nosebleeds, postnasal drip, rhinorrhea, sinus pressure, sneezing, sore throat and trouble swallowing.   Eyes: Negative for redness and itching.  Respiratory: Positive for cough, chest tightness and shortness of breath. Negative for wheezing.   Cardiovascular: Positive for palpitations. Negative for leg swelling.  Gastrointestinal: Negative for nausea and vomiting.  Genitourinary: Negative for dysuria.  Musculoskeletal: Negative for joint swelling.  Skin: Negative for rash.  Neurological: Negative for headaches.  Hematological: Does not bruise/bleed easily.  Psychiatric/Behavioral: Negative for dysphoric mood. The patient is not nervous/anxious.        Objective:   Physical Exam Filed Vitals:   01/01/14 1556  BP: 128/70  Pulse: 80  Height: 5\' 3"   (1.6 m)  Weight: 153 lb 3.2 oz (69.491 kg)  SpO2: 97%    Gen: well appearing, no acute distress HEENT: NCAT, PERRL, EOMi, OP clear, neck supple without masses PULM: CTA B CV: RRR, no mgr, no JVD AB: BS+, soft, nontender, no hsm Ext: warm, no edema, no clubbing, no cyanosis Derm: no rash or skin breakdown Neuro: A&Ox4, CN II-XII intact, strength 5/5 in all 4 extremities      Assessment & Plan:   COPD, severe COPD: GOLD Grade C, severe obstruction on Spirometry but moderate symptoms  Our biggest challenge here will be improving her symptoms with inhalers but not worsening her glaucoma.  I have advised that she try using Symbicort at one half of the lowest strength. I. advised her to start using it tonight and again tomorrow morning before she goes for a glaucoma check tomorrow.  -O2 therapy: Not indicated -Immunizations: Pneumonia shot is up-to-date, flu shot in the fall -Tobacco use: Quit 3 years ago -Exercise: Start pulmonary rehabilitation -Bronchodilator therapy: Start Symbicort 80/4.5 1 puff with spacer twice a day -Exacerbation prevention: N/A   Glaucoma Followup with ophthalmologist tomorrow. Use Symbicort twice before then and discuss this medicine with ophthalmologist.  Tobacco abuse She had a lengthy smoking history and quit 3 years ago. She smoked a total of about 45 pack years. Therefore she qualifies for lung cancer screening. Today we discussed the risks and benefits of this at length. I explained to her the high false positive rate and the need for ongoing screening. She is willing to proceed.  Plan: -Low dose screening CT    Updated Medication List Outpatient Encounter Prescriptions as of 01/01/2014  Medication Sig  . aspirin EC 81 MG tablet Take 81 mg by mouth daily.  . citalopram (CELEXA) 20 MG tablet Take 20 mg by mouth daily.  Marland Kitchen. gabapentin (NEURONTIN) 100 MG capsule Take 1 pill nightly at bedtime for 1 week, then 2 pills nightly for 1 week, then 3 pills  each night thereafter.  Marland Kitchen. ipratropium-albuterol (DUONEB) 0.5-2.5 (3) MG/3ML SOLN Take 3 mLs by nebulization every 8 (eight) hours as needed. For difficulty breathing  . latanoprost (XALATAN) 0.005 % ophthalmic solution Place 1 drop into both eyes at bedtime.  Marland Kitchen. levothyroxine (SYNTHROID, LEVOTHROID) 88 MCG tablet Take 88 mcg by mouth daily.  . Multiple Vitamins-Minerals (ONE-A-DAY WOMENS 50+ ADVANTAGE PO) Take 1 tablet by mouth daily.  . NON FORMULARY 2lpm at bedtime.

## 2014-01-01 NOTE — Assessment & Plan Note (Signed)
Followup with ophthalmologist tomorrow. Use Symbicort twice before then and discuss this medicine with ophthalmologist.

## 2014-01-01 NOTE — Assessment & Plan Note (Signed)
She had a lengthy smoking history and quit 3 years ago. She smoked a total of about 45 pack years. Therefore she qualifies for lung cancer screening. Today we discussed the risks and benefits of this at length. I explained to her the high false positive rate and the need for ongoing screening. She is willing to proceed.  Plan: -Low dose screening CT

## 2014-01-02 DIAGNOSIS — H4011X0 Primary open-angle glaucoma, stage unspecified: Secondary | ICD-10-CM | POA: Diagnosis not present

## 2014-01-03 ENCOUNTER — Encounter: Payer: Self-pay | Admitting: Neurology

## 2014-01-03 ENCOUNTER — Ambulatory Visit (INDEPENDENT_AMBULATORY_CARE_PROVIDER_SITE_OTHER): Payer: Medicare Other | Admitting: Neurology

## 2014-01-03 VITALS — BP 116/61 | HR 71 | Temp 98.0°F | Wt 153.0 lb

## 2014-01-03 DIAGNOSIS — J449 Chronic obstructive pulmonary disease, unspecified: Secondary | ICD-10-CM | POA: Diagnosis not present

## 2014-01-03 DIAGNOSIS — R259 Unspecified abnormal involuntary movements: Secondary | ICD-10-CM | POA: Diagnosis not present

## 2014-01-03 DIAGNOSIS — R251 Tremor, unspecified: Secondary | ICD-10-CM

## 2014-01-03 MED ORDER — GABAPENTIN 100 MG PO CAPS: ORAL_CAPSULE | ORAL | Status: DC

## 2014-01-03 NOTE — Patient Instructions (Signed)
  Please remember, that any kind of tremor may be exacerbated by anxiety, anger, nervousness, excitement, dehydration, sleep deprivation, by caffeine, and low blood sugar values or blood sugar fluctuations. Some medications, especially some antidepressants and lithium can cause or exacerbate tremors. Tremors may temporarily calm down her subside with the use of a benzodiazepine such as Valium or related medications and with alcohol. Be aware however that drinking alcohol is not an approved treatment or appropriate treatment for tremor control and long-term use of benzodiazepines such as Valium, lorazepam, alprazolam, or clonazepam can cause habit formation, physical and psychological addiction.  

## 2014-01-03 NOTE — Progress Notes (Signed)
Subjective:    Patient ID: Brittany Crosby is a 73 y.o. female.  HPI    Interim history:   Brittany Crosby is a 73 year old right-handed woman with an underlying medical history of lower back pain with history of herniated disc, hyperlipidemia, glaucoma, cataracts, hiatal hernia, depression, anxiety, hypertension, osteopenia, and COPD in the context of prior smoking (quit some 3 years ago), status post tonsillectomy, appendectomy, cholecystectomy, bladder surgery and abdominal hysterectomy, who presents for followup consultation of her right hand tremor. The patient is unaccompanied today. I first met her on 08/21/2013 at the request of her primary care physician at which time she reported a one-year history of gradual onset and progressive R hand tremor, affecting her handwriting (which has become smaller) and her feeding skills. Some months later, she noted a R foot tremor. At the time of her first visit I felt that there was a chance she had no telltale signs of Parkinson's disease or parkinsonism but this could not be completely excluded. I suggested a brain MRI with and without contrast. She had a brain MRI with and without contrast on 09/17/2013: Mild periventricular and subcortical non-specific gliosis. These findings are non-specific and considerations include autoimmune, inflammatory, post-infectious, microvascular ischemic or migraine associated etiologies. 2. No acute findings. In addition, personally reviewed the images through the PACS system. We called her with the test results. She called in 4/15 for ongoing tremors and had already reduced her caffeine intake as recommended and I called in low dose gabapentin with titration and she is now on 300 mg qHS. She thinks her tremor is a little better and she has residual LE tremor on the R and mild morning grogginess.   Today, she reports, that she is on oxygen at night now and has a new inhaler for her COPD, which has helped.   She reported  difficulty getting out of a chair and reported balance problems. She reported a total of 4 falls in 12 months. No LOC, no head injury was reported. She reported no FHx of PD, but MGM had MS and daughter has dystonia. Her mother had a head tremor. She denied exposure to pesticides or other harsh chemicals. She worked in [employer]. She does not drink alcohol. She does drink sweet tea and 3 cans of cola per day. She has been on Celexa for the past 2 years. She has no history of neuroleptic medication use or Reglan. She had blood work on 08/16/13: CBC with diff, TSH, ESR and B12.   Her Past Medical History Is Significant For: Past Medical History  Diagnosis Date  . Thyroid disease hypothyroidism  . COPD (chronic obstructive pulmonary disease)   . Glaucoma   . Anxiety   . Hiatal hernia   . Disorder of bone and cartilage, unspecified   . Major depressive disorder, single episode, unspecified     Her Past Surgical History Is Significant For: Past Surgical History  Procedure Laterality Date  . Tonsillectomy    . Appendectomy    . Cholecystectomy    . Abdominal hysterectomy    . Bladder augmentation    . Back surgery      Her Family History Is Significant For: Family History  Problem Relation Age of Onset  . Dementia Mother   . Heart attack Father     Her Social History Is Significant For: History   Social History  . Marital Status: Single    Spouse Name: [name]    Number of Children: 4  . Years of  Education: college   Occupational History  . retired    Social History Main Topics  . Smoking status: Former Smoker    Types: Cigarettes    Quit date: 11/22/2011  . Smokeless tobacco: Never Used  . Alcohol Use: No  . Drug Use: No  . Sexual Activity: Yes    Birth Control/ Protection: Surgical   Other Topics Concern  . None   Social History Narrative   Patient is right handed, resides with signficant other in a home    Her Allergies Are:  Allergies  Allergen Reactions  .  Codeine Other (See Comments)    Abdominal pain  . Sulfa Antibiotics Swelling  . Penicillins Rash  :   Her Current Medications Are:  Outpatient Encounter Prescriptions as of 01/03/2014  Medication Sig  . aspirin EC 81 MG tablet Take 81 mg by mouth daily.  . Budesonide-Formoterol Fumarate (SYMBICORT IN) Inhale into the lungs. Twice a day, one puff in am and one puff pm  . citalopram (CELEXA) 20 MG tablet Take 20 mg by mouth daily.  Marland Kitchen gabapentin (NEURONTIN) 100 MG capsule Take 1 pill nightly at bedtime for 1 week, then 2 pills nightly for 1 week, then 3 pills each night thereafter.  . latanoprost (XALATAN) 0.005 % ophthalmic solution Place 1 drop into both eyes at bedtime.  Marland Kitchen levothyroxine (SYNTHROID, LEVOTHROID) 88 MCG tablet Take 88 mcg by mouth daily.  . Multiple Vitamins-Minerals (ONE-A-DAY WOMENS 50+ ADVANTAGE PO) Take 1 tablet by mouth daily.  . NON FORMULARY 2lpm at bedtime.  Brittany Crosby IN Inhale into the lungs. daily  . Spacer/Aero-Holding Chambers (AEROCHAMBER MV) inhaler Use as instructed  . [DISCONTINUED] ipratropium-albuterol (DUONEB) 0.5-2.5 (3) MG/3ML SOLN Take 3 mLs by nebulization every 8 (eight) hours as needed. For difficulty breathing  :  Review of Systems:  Out of a complete 14 point review of systems, all are reviewed and negative with the exception of these symptoms as listed below:  Review of Systems  Respiratory: Positive for chest tightness.   Musculoskeletal: Positive for gait problem.  Neurological: Positive for tremors and weakness.    Objective:  Neurologic Exam  Physical Exam Physical Examination:   Filed Vitals:   01/03/14 1455  BP: 116/61  Pulse: 71  Temp: 98 F (36.7 C)   General Examination: The patient is a very pleasant 73 y.o. female in no acute distress.  HEENT: Normocephalic, atraumatic, pupils are equal, round and reactive to light and accommodation. Funduscopic exam is normal with sharp disc margins noted. Extraocular tracking  shows no significant saccadic breakdown without nystagmus noted. There is no limitation to her gaze. There is no decrease in eye blink rate. Hearing is intact. Face is symmetric with slight facial masking and normal facial sensation. There is no lip, neck or jaw tremor. Neck is not rigid with intact passive ROM. There are no carotid bruits on auscultation. Oropharynx exam reveals moderate mouth dryness. Moderate airway crowding is noted, due to narrow airway and thick soft palate. She has dentures in the upper and is nearly edentulous in the lower. She  Mallampati is class II. Tongue protrudes centrally and palate elevates symmetrically. There is no drooling.   Chest: is clear to auscultation without wheezing, rhonchi or crackles noted.  Heart: sounds are regular and normal without murmurs, rubs or gallops noted.   Abdomen: is soft, non-tender and non-distended with normal bowel sounds appreciated on auscultation.  Extremities: There is trace pitting edema in the distal lower extremities bilaterally. Pedal  pulses are intact. There are no varicose veins.  Skin: is warm and dry with no trophic changes noted. Age-related changes are noted on the skin.   Musculoskeletal: exam reveals no obvious joint deformities, tenderness, joint swelling or erythema.  Neurologically:   Mental status: The patient is awake and alert, paying good  attention. She is able to completely provide the history. She is oriented to: person, place, time/date, situation, day of week, month of year and year. Her memory, attention, language and knowledge are intact. There is no aphasia, agnosia, apraxia or anomia. There is a no evidence of bradyphrenia. Speech is not hypophonic with no dysarthria noted. Mood is congruent and affect is normal.   Cranial nerves are as described above under HEENT exam. In addition, shoulder shrug is normal with equal shoulder height noted.  Motor exam: Normal bulk, and strength for age is noted.  There are no dyskinesias noted.  Tone is not particularly rigid with absence of cogwheeling in the limbs. There is overall very slight bradykinesia. There is no drift or rebound.  There is a slight intermittent resting tremor in the right lower extremity and no resting tremor in the right upper extremity. The tremor seems just a little bit faster than the typical Parkinson's tremor. Handwriting is tremulous but not micrographic. Romberg is negative, but shows some swaying.  Reflexes are 2+ in the upper extremities and 2+ in the lower extremities.  Fine motor skills exam: Finger taps are Slightly impaired on the right and Not impaired on the left. Hand movements are Slightly impaired on the right and Not impaired on the left. RAP (rapid alternating patting) is Slightly impaired on the right and Not impaired on the left. Foot taps are mildly impaired on the right and not impaired on the left. Foot agility (in the form of heel stomping) is mildly impaired on the right and Not impaired on the left.    Cerebellar testing shows no dysmetria or intention tremor on finger to nose testing. Heel to shin is unremarkable bilaterally. There is no truncal or gait ataxia.   Sensory exam is intact to light touch, pinprick, vibration, temperature sense in the upper and lower extremities.   Gait, station and balance: She stands up from the seated position with mild difficulty and does need to push up with Her hands. She needs no assistance, but takes 3 attempts. No veering to one side is noted. She is not noted to lean to the side. Posture is mildly stooped, possibly age-appropriate. Stance is narrow-based. She walks with decrease in stride length and pace and decreased arm swing on the right. However, later, I notice that her arm swing on the right when she walks carrying her him back on the left side. She turns in 3 steps. Tandem walk is not possible. Balance is Very mildly impaired. She is able to do a toe or heel  stance.      Assessment and Plan:   In summary, Brittany Crosby is a very pleasant 72 year old female with an underlying medical history of lower back pain with history of herniated disc, hyperlipidemia, glaucoma, cataracts, hiatal hernia, depression, anxiety, hypertension, osteopenia, and COPD in the context of prior smoking (quit some 3 years ago), status post tonsillectomy, appendectomy, cholecystectomy, bladder surgery and abdominal hysterectomy, who reports a one plus year history of gradual onset and progressive R sided tremor in the upper and lower extremities. While she does have lateralizing findings on the right side, I do not think findings  are telltale for parkinsonism. She has a somewhat faster tremor. She has not a whole lot of increase in tone, and there is a slight degree of variability in her exam. Nevertheless we cannot fully exclude that she has a mild form of parkinsonism or even right-sided predominant Parkinson's disease. She is stable or a little better and feels, the gabapentin has helped. Sometimes an SSRI type antidepressants can bring on tremors. It would be unusual to bring on an asymmetrical tremor. At this juncture, I would like for her to continue with low dose gabapentin and she is advised not to drive when she feels groggy. I would like to see her back in 4-5 months. I renewed her prescription for gabapentin. She was in agreement with the plan.

## 2014-01-04 ENCOUNTER — Encounter (HOSPITAL_COMMUNITY)
Admission: RE | Admit: 2014-01-04 | Discharge: 2014-01-04 | Disposition: A | Discharge status: Home / Self Care | Payer: Medicare Other | Source: Ambulatory Visit | Attending: Internal Medicine | Admitting: Internal Medicine

## 2014-01-04 ENCOUNTER — Encounter (HOSPITAL_COMMUNITY): Payer: Self-pay

## 2014-01-04 VITALS — BP 116/60 | HR 75 | Resp 18 | Ht 62.5 in | Wt 150.8 lb

## 2014-01-04 DIAGNOSIS — J449 Chronic obstructive pulmonary disease, unspecified: Secondary | ICD-10-CM | POA: Diagnosis not present

## 2014-01-04 DIAGNOSIS — F17200 Nicotine dependence, unspecified, uncomplicated: Secondary | ICD-10-CM | POA: Insufficient documentation

## 2014-01-04 DIAGNOSIS — J4489 Other specified chronic obstructive pulmonary disease: Secondary | ICD-10-CM | POA: Diagnosis not present

## 2014-01-04 DIAGNOSIS — J438 Other emphysema: Secondary | ICD-10-CM

## 2014-01-07 NOTE — Progress Notes (Signed)
Brittany Crosby 73 y.o. female Pulmonary Rehab Orientation Note Patient arrived today in Cardiac and Pulmonary Rehab for orientation to Pulmonary Rehab. [facility]. She does not carry portable oxygen. Per pt, she uses 2L oxygen at HS.  Color good, skin warm and dry. Patient is oriented to time and place. Patient's medical history and medications reviewed. Heart rate is normal, breath sounds clear to auscultation, no wheezes, rales, or rhonchi. Grip strength unequal, L>R and weak. Patient sees a neurologist for an unnamed dx. She has extreme weakness of her right side (arm and leg) with a tremor. Patient states she has ruled out for Parkinson's. Distal pulses palpable. Mild pitting edema noted to ankles bilat. Patient reports she does take medications as prescribed. Patient states she follows a Regular diet. The patient reports no specific efforts to gain or lose weight.. Patient's weight will be monitored closely. Demonstration and practice of PLB using pulse oximeter. Patient able to return demonstration satisfactorily. Safety and hand hygiene in the exercise area reviewed with patient. Patient voices understanding of the information reviewed. Department expectations discussed with patient and achievable goals were set. The patient shows enthusiasm about attending the program and we look forward to working with this nice gentleman. The patient is scheduled for a 6 min walk test on Thursday 8/20 at 3:30 and to begin exercise on Tuesday 8/25 at 1:30.   45 minutes was spent on a variety of activities such as assessment of the patient, obtaining baseline data including height, weight, BMI, and grip strength, verifying medical history, allergies, and current medications, and teaching patient strategies for performing tasks with less respiratory effort with emphasis on pursed lip breathing.

## 2014-01-08 ENCOUNTER — Ambulatory Visit (INDEPENDENT_AMBULATORY_CARE_PROVIDER_SITE_OTHER)
Admission: RE | Admit: 2014-01-08 | Discharge: 2014-01-08 | Disposition: A | Discharge status: Home / Self Care | Payer: Medicare Other | Source: Ambulatory Visit | Attending: Pulmonary Disease | Admitting: Pulmonary Disease

## 2014-01-08 DIAGNOSIS — J438 Other emphysema: Secondary | ICD-10-CM | POA: Diagnosis not present

## 2014-01-08 DIAGNOSIS — F17200 Nicotine dependence, unspecified, uncomplicated: Secondary | ICD-10-CM

## 2014-01-08 DIAGNOSIS — Z122 Encounter for screening for malignant neoplasm of respiratory organs: Secondary | ICD-10-CM

## 2014-01-08 DIAGNOSIS — J411 Mucopurulent chronic bronchitis: Secondary | ICD-10-CM | POA: Diagnosis not present

## 2014-01-08 DIAGNOSIS — Z720 Tobacco use: Secondary | ICD-10-CM

## 2014-01-10 ENCOUNTER — Encounter (HOSPITAL_COMMUNITY)
Admission: RE | Admit: 2014-01-10 | Discharge: 2014-01-10 | Disposition: A | Discharge status: Home / Self Care | Payer: Medicare Other | Source: Ambulatory Visit | Attending: Internal Medicine | Admitting: Internal Medicine

## 2014-01-10 DIAGNOSIS — J4489 Other specified chronic obstructive pulmonary disease: Secondary | ICD-10-CM | POA: Diagnosis not present

## 2014-01-10 DIAGNOSIS — F17200 Nicotine dependence, unspecified, uncomplicated: Secondary | ICD-10-CM | POA: Diagnosis not present

## 2014-01-10 DIAGNOSIS — J449 Chronic obstructive pulmonary disease, unspecified: Secondary | ICD-10-CM | POA: Diagnosis not present

## 2014-01-10 NOTE — Progress Notes (Signed)
Quick Note:  Spoke with pt, she is aware of results and recs. Nothing further needed. ______ 

## 2014-01-10 NOTE — Progress Notes (Signed)
Brittany Crosby completed a Six-Minute Walk Test on 01/10/14 . Macie walked 552 feet with 0 breaks.  The patient's lowest oxygen saturation was 95% , highest heart rate was 94 bpm , and highest blood pressure was 124/62. The patient was on room air.  Brittany Crosby stated that nothing hindered their walk test.

## 2014-01-15 ENCOUNTER — Encounter (HOSPITAL_COMMUNITY)
Admission: RE | Admit: 2014-01-15 | Discharge: 2014-01-15 | Disposition: A | Discharge status: Home / Self Care | Payer: Medicare Other | Source: Ambulatory Visit | Attending: Internal Medicine | Admitting: Internal Medicine

## 2014-01-15 ENCOUNTER — Ambulatory Visit: Payer: Medicare Other | Admitting: Neurology

## 2014-01-15 DIAGNOSIS — J449 Chronic obstructive pulmonary disease, unspecified: Secondary | ICD-10-CM | POA: Diagnosis not present

## 2014-01-15 DIAGNOSIS — F17200 Nicotine dependence, unspecified, uncomplicated: Secondary | ICD-10-CM | POA: Diagnosis not present

## 2014-01-15 DIAGNOSIS — J4489 Other specified chronic obstructive pulmonary disease: Secondary | ICD-10-CM | POA: Diagnosis not present

## 2014-01-15 NOTE — Progress Notes (Signed)
Today, Holden exercised at [facility]. Service time [employer] 1330 to 1515.  The patient exercised for more than 31 minutes performing aerobic, strengthening, and stretching exercises. Oxygen saturation, heart rate, blood pressure, rate of perceived exertion, and shortness of breath were all monitored before, during, and after exercise. Lorna presented with no problems at today's exercise session.   There was no workload change during today's exercise session.  Pre-exercise vitals:   Weight kg: 69.0   Liters of O2: RA   SpO2: 95   HR: 77   BP: 120/72   CBG: NA  Exercise vitals:   Highest heartrate:  86   Lowest oxygen saturation: 97   Highest blood pressure: 130/68   Liters of 02: RA  Post-exercise vitals:   SpO2: 96   HR: 74   BP: 104/60   Liters of O2: RA   CBG: NA Dr. Kalman Shan, Medical Director Dr. Waymon Amato is immediately available during today's Pulmonary Rehab session for [facility] on 01/15/2014 at 1330 class time.

## 2014-01-17 ENCOUNTER — Encounter (HOSPITAL_COMMUNITY)
Admission: RE | Admit: 2014-01-17 | Discharge: 2014-01-17 | Disposition: A | Discharge status: Home / Self Care | Payer: Medicare Other | Source: Ambulatory Visit | Attending: Internal Medicine | Admitting: Internal Medicine

## 2014-01-17 DIAGNOSIS — F17200 Nicotine dependence, unspecified, uncomplicated: Secondary | ICD-10-CM | POA: Diagnosis not present

## 2014-01-17 DIAGNOSIS — J449 Chronic obstructive pulmonary disease, unspecified: Secondary | ICD-10-CM | POA: Diagnosis not present

## 2014-01-17 DIAGNOSIS — J4489 Other specified chronic obstructive pulmonary disease: Secondary | ICD-10-CM | POA: Diagnosis not present

## 2014-01-17 NOTE — Progress Notes (Signed)
Today, Brittany Crosby exercised at [facility]. Service time was from 1230 to 1430.  The patient exercised for more than 31 minutes performing aerobic, strengthening, and stretching exercises. Oxygen saturation, heart rate, blood pressure, rate of perceived exertion, and shortness of breath were all monitored before, during, and after exercise. Brittany Crosby presented with no problems at today's exercise session. Brittany Crosby also attended a Q and A session with the pulmonologist.  There was no workload change during today's exercise session.  Pre-exercise vitals:   Weight kg: 69.0   Liters of O2: ra   SpO2: 99   HR: 69   BP: 128/49   CBG: na  Exercise vitals:   Highest heartrate:  73   Lowest oxygen saturation: 96   Highest blood pressure: 126/64   Liters of 02: ra  Post-exercise vitals:   SpO2: 96   HR: 57   BP: 104/60   Liters of O2: ra   CBG: na  Dr. Kalman Shan, Medical Director Dr. Jomarie Longs is immediately available during today's Pulmonary Rehab session for [facility] on 01/17/2014 at 1230 class time.

## 2014-01-22 ENCOUNTER — Encounter (HOSPITAL_COMMUNITY)
Admission: RE | Admit: 2014-01-22 | Discharge: 2014-01-22 | Disposition: A | Discharge status: Home / Self Care | Payer: Medicare Other | Source: Ambulatory Visit | Attending: Internal Medicine | Admitting: Internal Medicine

## 2014-01-22 DIAGNOSIS — F17200 Nicotine dependence, unspecified, uncomplicated: Secondary | ICD-10-CM | POA: Diagnosis not present

## 2014-01-22 DIAGNOSIS — J449 Chronic obstructive pulmonary disease, unspecified: Secondary | ICD-10-CM | POA: Insufficient documentation

## 2014-01-22 DIAGNOSIS — J4489 Other specified chronic obstructive pulmonary disease: Secondary | ICD-10-CM | POA: Insufficient documentation

## 2014-01-22 NOTE — Progress Notes (Signed)
Today, Marchele exercised at [facility]. Service time was from 1330 to 1515.  The patient exercised for more than 31 minutes performing aerobic, strengthening, and stretching exercises. Oxygen saturation, heart rate, blood pressure, rate of perceived exertion, and shortness of breath were all monitored before, during, and after exercise. Luciana presented with no problems at today's exercise session.   There was no workload change during today's exercise session.  Pre-exercise vitals:   Weight kg: 69.4   Liters of O2: RA   SpO2: 98   HR: 77   BP: 118/58   CBG: NA  Exercise vitals:   Highest heartrate:  82   Lowest oxygen saturation: 96   Highest blood pressure: 104/64   Liters of 02: RA  Post-exercise vitals:   SpO2: 96   HR: 60   BP: 104/60   Liters of O2: RA   CBG: NA Dr. Kalman Shan, Medical Director Dr. Jomarie Longs is immediately available during today's Pulmonary Rehab session for [facility] on 01/22/2014 at 1330 class time.

## 2014-01-24 ENCOUNTER — Encounter (HOSPITAL_COMMUNITY)
Admission: RE | Admit: 2014-01-24 | Discharge: 2014-01-24 | Disposition: A | Discharge status: Home / Self Care | Payer: Medicare Other | Source: Ambulatory Visit | Attending: Internal Medicine | Admitting: Internal Medicine

## 2014-01-24 DIAGNOSIS — J449 Chronic obstructive pulmonary disease, unspecified: Secondary | ICD-10-CM | POA: Diagnosis not present

## 2014-01-24 NOTE — Progress Notes (Signed)
Today, Brittany Crosby exercised at [facility] [employer]. Service time was from 1330 to 1530.  The patient exercised for more than 31 minutes performing aerobic, strengthening, and stretching exercises. Oxygen saturation, heart rate, blood pressure, rate of perceived exertion, and shortness of breath were all monitored before, during, and after exercise. Dim presented with no problems at today's exercise session. Patient attended the oxygen safety class today.   There was one workload change during today's exercise session.  Pre-exercise vitals:   Weight kg: 69.2   Liters of O2: RA   SpO2: 96   HR: 69   BP: 116/69   CBG: NA  Exercise vitals:   Highest heartrate:  77   Lowest oxygen saturation: 95   Highest blood pressure: 122/60   Liters of 02: RA  Post-exercise vitals:   SpO2: 97   HR: 63   BP: 102/60   Liters of O2: RA   CBG: NA Dr. Kalman Shan, Medical Director Dr. Cena Benton is immediately available during today's Pulmonary Rehab session for Brittany Crosby on 01/24/2014 at 1330 class time.

## 2014-01-29 ENCOUNTER — Encounter (HOSPITAL_COMMUNITY)
Admission: RE | Admit: 2014-01-29 | Discharge: 2014-01-29 | Disposition: A | Discharge status: Home / Self Care | Payer: Medicare Other | Source: Ambulatory Visit | Attending: Internal Medicine | Admitting: Internal Medicine

## 2014-01-29 DIAGNOSIS — J449 Chronic obstructive pulmonary disease, unspecified: Secondary | ICD-10-CM | POA: Diagnosis not present

## 2014-01-29 NOTE — Progress Notes (Signed)
Brittany Crosby 73 y.o. female Nutrition Note Spoke with pt. Pt is overweight.  Pt eats 4 small meals a day "because of my hiatal hernia"; most meals prepared at home. There are some ways the pt can make her eating habits healthier. Pt's Rate Your Plate results reviewed with pt. Pt does not avoid salty food; uses canned/ convenience food.  Pt does not add salt to food at the table. The role of sodium in lung disease reviewed with pt. Pt reports decreased appetite due to hiatal hernia and difficulty chewing due to dental work needed. Pt's poor dentition noted. Pt encouraged to eat soft and/or finely chopped foods. Pt expressed understanding of the information reviewed.  Nutrition Diagnosis   Food-and nutrition-related knowledge deficit related to lack of exposure to information as related to diagnosis of pulmonary disease    Nutrition Intervention   Pt's individual nutrition plan and goals reviewed with pt.   Benefits of adopting healthy eating habits discussed when pt's Rate Your Plate reviewed.   Pt to attend the Nutrition and Lung Disease class   Continual client-centered nutrition education by RD, as part of interdisciplinary care. Goal(s) 1. The pt will recognize symptoms that can interfere with adequate oral intake, such as shortness of breath, N/V, early satiety, fatigue, ability to secure and prepare food, taste and smell changes, chewing/swallowing difficulties, and/ or pain when eating. 2. Describe the benefit of including fruits, vegetables, whole grains, and low-fat dairy products in a healthy meal plan. Monitor and Evaluate progress toward nutrition goal with team.   Mickle Plumb, M.Ed, RD, LDN, CDE 01/29/2014 2:09 PM

## 2014-01-29 NOTE — Progress Notes (Signed)
Today, Keiran exercised at [facility] [employer] [facility]. Service time was from 1330 to 1500.  The patient exercised for more than 31 minutes performing aerobic, strengthening, and stretching exercises. Oxygen saturation, heart rate, blood pressure, rate of perceived exertion, and shortness of breath were all monitored before, during, and after exercise. Murielle presented with no problems at today's exercise session.   There was a workload change during today's exercise session.  Pre-exercise vitals:   Weight kg: 69.5   Liters of O2: ra   SpO2: 98   HR: 70   BP: 102/62   CBG: na  Exercise vitals:   Highest heartrate:  82   Lowest oxygen saturation: 95   Highest blood pressure: 130/60   Liters of 02: ra  Post-exercise vitals:   SpO2: 98   HR: 53   BP: 110/78   Liters of O2: ra   CBG: na  Dr. Kalman Shan, Medical Director Dr. Cena Benton is immediately available during today's Pulmonary Rehab session for [facility] on 01/29/2014 at 1330 class time.

## 2014-01-31 ENCOUNTER — Encounter (HOSPITAL_COMMUNITY)
Admission: RE | Admit: 2014-01-31 | Discharge: 2014-01-31 | Disposition: A | Discharge status: Home / Self Care | Payer: Medicare Other | Source: Ambulatory Visit | Attending: Internal Medicine | Admitting: Internal Medicine

## 2014-01-31 DIAGNOSIS — J449 Chronic obstructive pulmonary disease, unspecified: Secondary | ICD-10-CM | POA: Diagnosis not present

## 2014-01-31 NOTE — Progress Notes (Addendum)
Today, Brittany Crosby exercised at [facility] [facility]. Service time was from 1:30 to 3:30.  The patient exercised for more than 31 minutes performing aerobic, strengthening, and stretching exercises. Oxygen saturation, heart rate, blood pressure, rate of perceived exertion, and shortness of breath were all monitored before, during, and after exercise. Brittany Crosby presented with no problems at today's exercise session. The patient attended education class with Mickle Plumb on Nutrition.  There was an increase in workload change during today's exercise session.  Pre-exercise vitals:   Weight kg: 69.3   Liters of O2: ra   SpO2: 99   HR: 69   BP: 110/52   CBG: na  Exercise vitals:   Highest heartrate:  72   Lowest oxygen saturation: 97   Highest blood pressure: 116/66   Liters of 02: ra  Post-exercise vitals:   SpO2: 94   HR: 61   BP: 110/60   Liters of O2: ra   CBG: na  Dr. Kalman Shan, Medical Director Dr. Vanessa Barbara is immediately available during today's Pulmonary Rehab session for Brittany Crosby on 01/31/14 at 1:30pm class time.

## 2014-02-01 ENCOUNTER — Telehealth: Payer: Self-pay | Admitting: Pulmonary Disease

## 2014-02-01 MED ORDER — BUDESONIDE-FORMOTEROL FUMARATE 160-4.5 MCG/ACT IN AERO: 2.0000 | INHALATION_SPRAY | Freq: Two times a day (BID) | RESPIRATORY_TRACT | Status: DC

## 2014-02-01 NOTE — Telephone Encounter (Signed)
Spoke with the pt and rx sent. Carron Curie, CMA

## 2014-02-01 NOTE — Telephone Encounter (Signed)
[id number]. Brittany Crosby, CMA   

## 2014-02-05 ENCOUNTER — Encounter (HOSPITAL_COMMUNITY)
Admission: RE | Admit: 2014-02-05 | Discharge: 2014-02-05 | Disposition: A | Discharge status: Home / Self Care | Payer: Medicare Other | Source: Ambulatory Visit | Attending: Internal Medicine | Admitting: Internal Medicine

## 2014-02-05 DIAGNOSIS — J449 Chronic obstructive pulmonary disease, unspecified: Secondary | ICD-10-CM | POA: Diagnosis not present

## 2014-02-05 NOTE — Progress Notes (Signed)
Today, Maddix exercised at [facility] [facility]. Service time was from 1:30pm to 3:30pm.  The patient exercised for more than 31 minutes performing aerobic, strengthening, and stretching exercises. Oxygen saturation, heart rate, blood pressure, rate of perceived exertion, and shortness of breath were all monitored before, during, and after exercise. Tere presented with no problems at today's exercise session.   There was an increase in workload change during today's exercise session.  Pre-exercise vitals:   Weight kg: 69.3   Liters of O2: ra   SpO2: 99   HR: 71   BP: 110/68   CBG: na  Exercise vitals:   Highest heartrate:  85   Lowest oxygen saturation: 98   Highest blood pressure: 122/64   Liters of 02: ra  Post-exercise vitals:   SpO2: 93   HR: 66   BP: 11060   Liters of O2: ra   CBG: na  Dr. Kalman Shan, Medical Director Dr. Vanessa Barbara is immediately available during today's Pulmonary Rehab session for [facility] on 02/05/14 at 1:30pm class time.

## 2014-02-05 NOTE — Progress Notes (Signed)
I have reviewed a Home Exercise Prescription with Brittany Crosby . [name] is currently exercising at home.  The patient was advised to walk 3-5 days a week for 30 minutes.  [name] and I discussed how to progress their exercise prescription.  The patient stated that their goals were to be able to walk at [facility] without feeling so short of breath and feel better overall.  The patient stated that they understand the exercise prescription.  We reviewed exercise guidelines, target heart rate during exercise, oxygen use, weather, home pulse oximeter, endpoints for exercise, and goals.  Patient is encouraged to come to me with any questions. I will continue to follow up with the patient to assist them with progression and safety.

## 2014-02-07 ENCOUNTER — Encounter (HOSPITAL_COMMUNITY)
Admission: RE | Admit: 2014-02-07 | Discharge: 2014-02-07 | Disposition: A | Discharge status: Home / Self Care | Payer: Medicare Other | Source: Ambulatory Visit | Attending: Internal Medicine | Admitting: Internal Medicine

## 2014-02-07 DIAGNOSIS — J449 Chronic obstructive pulmonary disease, unspecified: Secondary | ICD-10-CM | POA: Diagnosis not present

## 2014-02-07 NOTE — Progress Notes (Signed)
Today, Brittany Crosby exercised at [facility] [facility] [facility]. Service time was from 1330 to 1530.  The patient exercised for more than 31 minutes performing aerobic, strengthening, and stretching exercises. Oxygen saturation, heart rate, blood pressure, rate of perceived exertion, and shortness of breath were all monitored before, during, and after exercise. Brittany Crosby presented with no problems at today's exercise session. Brittany Crosby also attended an education session on exercise for the pulmonary patient.  There was no workload change during today's exercise session.  Pre-exercise vitals:   Weight kg: 69.3   Liters of O2: ra   SpO2: 98   HR: 66   BP: 122/60   CBG: na  Exercise vitals:   Highest heartrate:  82   Lowest oxygen saturation: 98   Highest blood pressure: 120/70   Liters of 02: ra  Post-exercise vitals:   SpO2: 97   HR: 54   BP: 100/60   Liters of O2: ra   CBG: na  Dr. Kalman Shan, Medical Director Dr. Rhona Leavens is immediately available during today's Pulmonary Rehab session for [facility] on 02/07/2014 at 1330 class time.

## 2014-02-11 ENCOUNTER — Ambulatory Visit
Admission: RE | Admit: 2014-02-11 | Discharge: 2014-02-11 | Disposition: A | Discharge status: Home / Self Care | Payer: Medicare Other | Source: Ambulatory Visit | Attending: Family Medicine | Admitting: Family Medicine

## 2014-02-11 ENCOUNTER — Other Ambulatory Visit: Payer: Self-pay | Admitting: Family Medicine

## 2014-02-11 DIAGNOSIS — R42 Dizziness and giddiness: Secondary | ICD-10-CM | POA: Diagnosis not present

## 2014-02-11 DIAGNOSIS — R110 Nausea: Secondary | ICD-10-CM

## 2014-02-11 DIAGNOSIS — S060XAA Concussion with loss of consciousness status unknown, initial encounter: Secondary | ICD-10-CM | POA: Diagnosis not present

## 2014-02-11 DIAGNOSIS — I6789 Other cerebrovascular disease: Secondary | ICD-10-CM | POA: Diagnosis not present

## 2014-02-11 DIAGNOSIS — S060X9A Concussion with loss of consciousness of unspecified duration, initial encounter: Secondary | ICD-10-CM | POA: Diagnosis not present

## 2014-02-11 DIAGNOSIS — S0990XA Unspecified injury of head, initial encounter: Secondary | ICD-10-CM | POA: Diagnosis not present

## 2014-02-12 ENCOUNTER — Telehealth (HOSPITAL_COMMUNITY): Payer: Self-pay

## 2014-02-12 ENCOUNTER — Encounter (HOSPITAL_COMMUNITY): Payer: Medicare Other

## 2014-02-14 ENCOUNTER — Encounter (HOSPITAL_COMMUNITY): Payer: Medicare Other

## 2014-02-18 ENCOUNTER — Ambulatory Visit (INDEPENDENT_AMBULATORY_CARE_PROVIDER_SITE_OTHER): Payer: Medicare Other | Admitting: Pulmonary Disease

## 2014-02-18 ENCOUNTER — Encounter: Payer: Self-pay | Admitting: Pulmonary Disease

## 2014-02-18 VITALS — BP 90/70 | HR 89 | Ht 62.0 in | Wt 151.0 lb

## 2014-02-18 DIAGNOSIS — J449 Chronic obstructive pulmonary disease, unspecified: Secondary | ICD-10-CM | POA: Diagnosis not present

## 2014-02-18 DIAGNOSIS — H409 Unspecified glaucoma: Secondary | ICD-10-CM

## 2014-02-18 DIAGNOSIS — Z23 Encounter for immunization: Secondary | ICD-10-CM

## 2014-02-18 MED ORDER — BUDESONIDE-FORMOTEROL FUMARATE 160-4.5 MCG/ACT IN AERO: 1.0000 | INHALATION_SPRAY | Freq: Two times a day (BID) | RESPIRATORY_TRACT | Status: DC

## 2014-02-18 NOTE — Assessment & Plan Note (Signed)
This has been quite a problem for her with inhalers in the past. So far she seems to be tolerating Symbicort without difficulty. She will followup with her ophthalmologist in about 6 weeks.

## 2014-02-18 NOTE — Patient Instructions (Signed)
Take Symbicort one puff twice a day  We will see you back in 6 moths or sooner if needed

## 2014-02-18 NOTE — Assessment & Plan Note (Signed)
This has been a stable interval for [place]. I clarified today that I only want her to take 1 puff a day on the Symbicort because of her glaucoma.  Plan: -Continue Symbicort 1 puff twice a day -Flu shot -Followup 6 months

## 2014-02-18 NOTE — Progress Notes (Signed)
Subjective:    Patient ID: Brittany Crosby, female    DOB: Jan 03, 1941, 73 y.o.   MRN: [id number]  Synopsis: GOLD Grade C COPD, severe airflow obstruction, has gluacoma  HPI Chief Complaint  Patient presents with  . Follow-up    Pt states since starting on the symbicort and attending pulm rehab her breathing has slightly improved. Pt states she still gets chest tightness with activity.  Pt denies cough.    02/18/2014 ROV >> Brittany Crosby says that her breathing is a little better since going to pulmonary rehab and taking the Symbicort.  Her opthalmologist is watching her on Symbicort now without making a change.  Apparently he thinks that it would take a bout a month to change her occular pressure. She feels better.  She likes going to rehab.  She struck her head recently while moving and had a headache with vomiting afterwards.  She was seen in the ED and was told to get a CT. She was told that she had a concussion so she has had to stay out of rehab.  Today was her first day driving and it went OK.  She has not had trouble with her vision.  Past Medical History  Diagnosis Date  . Thyroid disease hypothyroidism  . COPD (chronic obstructive pulmonary disease)   . Glaucoma   . Anxiety   . Hiatal hernia   . Disorder of bone and cartilage, unspecified   . Major depressive disorder, single episode, unspecified      Review of Systems     Objective:   Physical Exam Filed Vitals:   02/18/14 1346  BP: 90/70  Pulse: 89  Height:  (1.575 m)  Weight: 151 lb (68.493 kg)  SpO2: 98%  RA  Gen: well appearing, no acute distress HEENT: NCAT,  EOMi, OP clear,  PULM: CTA B CV: RRR, no mgr, no JVD AB: BS+, soft, nontender,  Ext: warm, no edema, no clubbing, no cyanosis Derm: no rash or skin breakdown Neuro: A&Ox4, MAEW        Assessment & Plan:   COPD, severe This has been a stable interval for [place]. I clarified today that I only want her to take 1 puff a day on the Symbicort  because of her glaucoma.  Plan: -Continue Symbicort 1 puff twice a day -Flu shot -Followup 6 months  Glaucoma This has been quite a problem for her with inhalers in the past. So far she seems to be tolerating Symbicort without difficulty. She will followup with her ophthalmologist in about 6 weeks.    Updated Medication List Outpatient Encounter Prescriptions as of 02/18/2014  Medication Sig  . aspirin EC 81 MG tablet Take 81 mg by mouth daily.  . budesonide-formoterol (SYMBICORT) 160-4.5 MCG/ACT inhaler Inhale 1 puff into the lungs 2 (two) times daily.  . citalopram (CELEXA) 20 MG tablet Take 20 mg by mouth daily.  Marland Kitchen gabapentin (NEURONTIN) 100 MG capsule Take 3 pills each night  . latanoprost (XALATAN) 0.005 % ophthalmic solution Place 1 drop into both eyes at bedtime.  Marland Kitchen levothyroxine (SYNTHROID, LEVOTHROID) 88 MCG tablet Take 88 mcg by mouth daily.  . Multiple Vitamins-Minerals (ONE-A-DAY WOMENS 50+ ADVANTAGE PO) Take 1 tablet by mouth daily.  . NON FORMULARY 2lpm at bedtime.  Docia Barrier IN Inhale into the lungs. daily  . Spacer/Aero-Holding Chambers (AEROCHAMBER MV) inhaler Use as instructed  . [DISCONTINUED] budesonide-formoterol (SYMBICORT) 160-4.5 MCG/ACT inhaler Inhale 2 puffs into the lungs 2 (two) times daily.

## 2014-02-19 ENCOUNTER — Telehealth (HOSPITAL_COMMUNITY): Payer: Self-pay | Admitting: *Deleted

## 2014-02-19 ENCOUNTER — Encounter (HOSPITAL_COMMUNITY): Payer: Medicare Other

## 2014-02-21 ENCOUNTER — Encounter (HOSPITAL_COMMUNITY): Payer: Medicare Other

## 2014-02-26 ENCOUNTER — Encounter (HOSPITAL_COMMUNITY)
Admission: RE | Admit: 2014-02-26 | Discharge: 2014-02-26 | Disposition: A | Discharge status: Home / Self Care | Payer: Medicare Other | Source: Ambulatory Visit | Attending: Internal Medicine | Admitting: Internal Medicine

## 2014-02-26 DIAGNOSIS — Z5189 Encounter for other specified aftercare: Secondary | ICD-10-CM | POA: Diagnosis not present

## 2014-02-26 DIAGNOSIS — J449 Chronic obstructive pulmonary disease, unspecified: Secondary | ICD-10-CM | POA: Insufficient documentation

## 2014-02-26 DIAGNOSIS — F17200 Nicotine dependence, unspecified, uncomplicated: Secondary | ICD-10-CM | POA: Insufficient documentation

## 2014-02-26 NOTE — Progress Notes (Signed)
Today, Brittany Crosby exercised at [facility]. Service time was from 1330 to 1500.  The patient exercised for more than 31 minutes performing aerobic, strengthening, and stretching exercises. Oxygen saturation, heart rate, blood pressure, rate of perceived exertion, and shortness of breath were all monitored before, during, and after exercise. Brittany RegalCarol presented with no problems at today's exercise session.   There was no workload change during today's exercise session.  Pre-exercise vitals:   Weight kg: 69.5   Liters of O2: RA   SpO2: 96   HR: 88   BP: 100/60   CBG: na  Exercise vitals:   Highest heartrate:  88   Lowest oxygen saturation: 96   Highest blood pressure: 124/68   Liters of 02: RA  Post-exercise vitals:   SpO2: 96   HR: 60   BP: 100/62   Liters of O2: RA   CBG: NA Dr. Kalman ShanMurali Ramaswamy, Medical Director Dr. David StallFeliz-Ortiz is immediately available during today's Pulmonary Rehab session for Brittany Crosby on 02/26/2014 at 1330 class time.

## 2014-02-28 ENCOUNTER — Encounter (HOSPITAL_COMMUNITY)
Admission: RE | Admit: 2014-02-28 | Discharge: 2014-02-28 | Disposition: A | Discharge status: Home / Self Care | Payer: Medicare Other | Source: Ambulatory Visit | Attending: Internal Medicine | Admitting: Internal Medicine

## 2014-02-28 DIAGNOSIS — Z5189 Encounter for other specified aftercare: Secondary | ICD-10-CM | POA: Diagnosis not present

## 2014-02-28 NOTE — Progress Notes (Signed)
Today, [name] exercised at [facility]. Service time was from 1330 to 1515.  The patient exercised for more than 31 minutes performing aerobic, strengthening, and stretching exercises. Oxygen saturation, heart rate, blood pressure, rate of perceived exertion, and shortness of breath were all monitored before, during, and after exercise. Brittany Crosby presented with no problems at today's exercise session. Brittany Crosby also attended an education session on pulmonary medications.  There was a workload change during today's exercise session.  Pre-exercise vitals:   Weight kg: 69   Liters of O2: ra   SpO2: 97   HR: 74   BP: 110/60   CBG: na  Exercise vitals:   Highest heartrate:  82   Lowest oxygen saturation: 97   Highest blood pressure: 122/72   Liters of 02: ra  Post-exercise vitals:   SpO2: 99   HR: 56   BP: 98/60   Liters of O2: ra   CBG: na  Dr. Kalman ShanMurali Ramaswamy, Medical Director Dr. Gwenlyn PerkingMadera is immediately available during today's Pulmonary Rehab session for Pollie Poyer on 02/28/2014 at 1330 class time.

## 2014-03-05 ENCOUNTER — Encounter (HOSPITAL_COMMUNITY): Payer: Medicare Other

## 2014-03-07 ENCOUNTER — Encounter (HOSPITAL_COMMUNITY)
Admission: RE | Admit: 2014-03-07 | Discharge: 2014-03-07 | Disposition: A | Discharge status: Home / Self Care | Payer: Medicare Other | Source: Ambulatory Visit | Attending: Internal Medicine | Admitting: Internal Medicine

## 2014-03-07 DIAGNOSIS — Z5189 Encounter for other specified aftercare: Secondary | ICD-10-CM | POA: Diagnosis not present

## 2014-03-07 NOTE — Progress Notes (Signed)
Today, Brittany Crosby exercised at [facility]. Service time was from 1330 to 1515.  The patient exercised for more than 31 minutes performing aerobic, strengthening, and stretching exercises. Oxygen saturation, heart rate, blood pressure, rate of perceived exertion, and shortness of breath were all monitored before, during, and after exercise. Brittany RegalCarol presented with no problems at today's exercise session. Patient attended the oxygen safety class today.   There was one workload change during today's exercise session.  Pre-exercise vitals:   Weight kg: 69.4   Liters of O2: RA   SpO2: 97   HR: 74   BP: 120/62   CBG: NA  Exercise vitals:   Highest heartrate:  84   Lowest oxygen saturation: 98   Highest blood pressure: 134/60   Liters of 02: RA  Post-exercise vitals:   SpO2: 93   HR: 66   BP: 104/62   Liters of O2: RA   CBG: NA Dr. Kalman ShanMurali Ramaswamy, Medical Director Dr. David StallFeliz-Ortiz is immediately available during today's Pulmonary Rehab session for Brittany Crosby on 03/07/2014 at 1330 class time.

## 2014-03-12 ENCOUNTER — Encounter (HOSPITAL_COMMUNITY): Payer: Medicare Other

## 2014-03-14 ENCOUNTER — Encounter (HOSPITAL_COMMUNITY): Payer: Medicare Other

## 2014-03-16 DIAGNOSIS — J00 Acute nasopharyngitis [common cold]: Secondary | ICD-10-CM | POA: Diagnosis not present

## 2014-03-19 ENCOUNTER — Encounter (HOSPITAL_COMMUNITY): Payer: Medicare Other

## 2014-03-21 ENCOUNTER — Encounter (HOSPITAL_COMMUNITY): Payer: Medicare Other

## 2014-03-26 ENCOUNTER — Encounter (HOSPITAL_COMMUNITY)
Admission: RE | Admit: 2014-03-26 | Discharge: 2014-03-26 | Disposition: A | Discharge status: Home / Self Care | Payer: Medicare Other | Source: Ambulatory Visit | Attending: Internal Medicine | Admitting: Internal Medicine

## 2014-03-26 DIAGNOSIS — Z5189 Encounter for other specified aftercare: Secondary | ICD-10-CM | POA: Insufficient documentation

## 2014-03-26 DIAGNOSIS — F17200 Nicotine dependence, unspecified, uncomplicated: Secondary | ICD-10-CM | POA: Insufficient documentation

## 2014-03-26 DIAGNOSIS — J449 Chronic obstructive pulmonary disease, unspecified: Secondary | ICD-10-CM | POA: Insufficient documentation

## 2014-03-26 NOTE — Progress Notes (Signed)
Today, Brittany Crosby exercised at [facility]. Service time was from 1330 to 1530.  The patient exercised for more than 31 minutes performing aerobic, strengthening, and stretching exercises. Oxygen saturation, heart rate, blood pressure, rate of perceived exertion, and shortness of breath were all monitored before, during, and after exercise. Brittany Crosby presented with no problems at today's exercise session.   There was one workload change during today's exercise session.  Pre-exercise vitals: . Weight kg: 68.9 . Liters of O2: RA . SpO2: 98 . HR: 77 . BP: 120/58 . CBG: NA  Exercise vitals: . Highest heartrate:  92 . Lowest oxygen saturation: 94 . Highest blood pressure: 132/60 . Liters of 02: RA  Post-exercise vitals: . SpO2: 96 . HR: 66 . BP: 104/60 . Liters of O2: RA . CBG: NA Dr. Kalman ShanMurali Ramaswamy, Medical Director Dr. David StallFeliz-Ortiz is immediately available during today's Pulmonary Rehab session for [facility] on 03/26/2014 at 1330 class time.  .Marland Kitchen

## 2014-03-28 ENCOUNTER — Encounter (HOSPITAL_COMMUNITY)
Admission: RE | Admit: 2014-03-28 | Discharge: 2014-03-28 | Disposition: A | Discharge status: Home / Self Care | Payer: Medicare Other | Source: Ambulatory Visit | Attending: Internal Medicine | Admitting: Internal Medicine

## 2014-03-28 DIAGNOSIS — Z5189 Encounter for other specified aftercare: Secondary | ICD-10-CM | POA: Diagnosis not present

## 2014-03-28 NOTE — Progress Notes (Signed)
Today, Brittany Crosby exercised at [facility]. Service time was from 1:30pm to 3:15pm.  The patient exercised for more than 31 minutes performing aerobic, strengthening, and stretching exercises. Oxygen saturation, heart rate, blood pressure, rate of perceived exertion, and shortness of breath were all monitored before, during, and after exercise. Brittany Crosby presented with no problems at today's exercise session. Patient attended education class with Brittany Crosby on [place] [employer].  There was no workload change during today's exercise session.  Pre-exercise vitals: . Weight kg: 68.9 . Liters of O2: ra . SpO2: 98 . HR: 80 . BP: 110/56 . CBG: na  Exercise vitals: . Highest heartrate:  84 . Lowest oxygen saturation: 95 . Highest blood pressure: 122/70 . Liters of 02: ra  Post-exercise vitals: . SpO2: 96 . HR: 76 . BP: 116/60 . Liters of O2: ra . CBG: na  Brittany Crosby, Medical Director Brittany Crosby is immediately available during today's Pulmonary Rehab [facility] on 03/28/14 at 1:30pm class time.

## 2014-04-02 ENCOUNTER — Encounter (HOSPITAL_COMMUNITY)
Admission: RE | Admit: 2014-04-02 | Discharge: 2014-04-02 | Disposition: A | Discharge status: Home / Self Care | Payer: Medicare Other | Source: Ambulatory Visit | Attending: Internal Medicine | Admitting: Internal Medicine

## 2014-04-02 DIAGNOSIS — Z5189 Encounter for other specified aftercare: Secondary | ICD-10-CM | POA: Diagnosis not present

## 2014-04-02 NOTE — Progress Notes (Signed)
Today, Ritta exercised at [facility]. Service time was from 1330 to 1450.  The patient exercised for more than 31 minutes performing aerobic, strengthening, and stretching exercises. Oxygen saturation, heart rate, blood pressure, rate of perceived exertion, and shortness of breath were all monitored before, during, and after exercise. [name] presented with no problems at today's exercise session.   There was a workload change during today's exercise session.  Pre-exercise vitals: . Weight kg: 68.8 . Liters of O2: ra . SpO2: 97 . HR: 83 . BP: 106/62 . CBG: na  Exercise vitals: . Highest heartrate:  96 . Lowest oxygen saturation: 95 . Highest blood pressure: 122/62 . Liters of 02: ra  Post-exercise vitals: . SpO2: 96 . HR: 79 . BP: 104/62 . Liters of O2: ra . CBG: na  Dr. Kalman ShanMurali Ramaswamy, Medical Director Dr. Butler Denmarkizwan is immediately available during today's Pulmonary Rehab session [facility] on 04/02/2014 at 1330 class time.

## 2014-04-03 DIAGNOSIS — H43393 Other vitreous opacities, bilateral: Secondary | ICD-10-CM | POA: Diagnosis not present

## 2014-04-04 ENCOUNTER — Encounter (HOSPITAL_COMMUNITY)
Admission: RE | Admit: 2014-04-04 | Discharge: 2014-04-04 | Disposition: A | Discharge status: Home / Self Care | Payer: Medicare Other | Source: Ambulatory Visit | Attending: Internal Medicine | Admitting: Internal Medicine

## 2014-04-04 DIAGNOSIS — Z5189 Encounter for other specified aftercare: Secondary | ICD-10-CM | POA: Diagnosis not present

## 2014-04-04 NOTE — Progress Notes (Signed)
Today, Merryl exercised at [facility]. Service time was from 1330 to 1520.  The patient exercised for more than 31 minutes performing aerobic, strengthening, and stretching exercises. Oxygen saturation, heart rate, blood pressure, rate of perceived exertion, and shortness of breath were all monitored before, during, and after exercise. [name] presented with no problems at today's exercise session. Patient attended the oxygen safety class today.  There was no workload change during today's exercise session.  Pre-exercise vitals: . Weight kg: 69.1 . Liters of O2: RA . SpO2: 97 . HR: 69 . BP: 118/62 . CBG: NA  Exercise vitals: . Highest heartrate:  94 . Lowest oxygen saturation: 94 . Highest blood pressure: 122/66 . Liters of 02: RA  Post-exercise vitals: . SpO2: 97 . HR: 79 . BP: 100/60 . Liters of O2: RA . CBG: NA Dr. Kalman ShanMurali Ramaswamy, Medical Director Dr. Rhona Leavenshiu is immediately available during today's Pulmonary Rehab session [facility] on 04/04/2014 at 1330 class time.  .Marland Kitchen

## 2014-04-09 ENCOUNTER — Encounter (HOSPITAL_COMMUNITY)
Admission: RE | Admit: 2014-04-09 | Discharge: 2014-04-09 | Disposition: A | Discharge status: Home / Self Care | Payer: Medicare Other | Source: Ambulatory Visit | Attending: Internal Medicine | Admitting: Internal Medicine

## 2014-04-09 DIAGNOSIS — Z5189 Encounter for other specified aftercare: Secondary | ICD-10-CM | POA: Diagnosis not present

## 2014-04-09 NOTE — Progress Notes (Signed)
Today, Brittany Crosby exercised at [facility]. Service time was from 1330 to 1500.  The patient exercised for more than 31 minutes performing aerobic, strengthening, and stretching exercises. Oxygen saturation, heart rate, blood pressure, rate of perceived exertion, and shortness of breath were all monitored before, during, and after exercise. Brittany Crosby presented with no problems at today's exercise session.   There was a workload change during today's exercise session.  Pre-exercise vitals: . Weight kg: 69.6 . Liters of O2: ra . SpO2: 98 . HR: 75 . BP: 112/58 . CBG: na  Exercise vitals: . Highest heartrate:  92 . Lowest oxygen saturation: 97 . Highest blood pressure: 108/60 . Liters of 02: ra  Post-exercise vitals: . SpO2: 97 . HR: 74 . BP: 112/66 . Liters of O2: ra . CBG: na  Dr. Kalman ShanMurali Ramaswamy, Medical Director Dr. Rhona Leavenshiu is immediately available during today's Pulmonary Rehab session for [facility] on 04/09/2014 at 1330 class time.

## 2014-04-11 ENCOUNTER — Telehealth (HOSPITAL_COMMUNITY): Payer: Self-pay | Admitting: Family Medicine

## 2014-04-11 ENCOUNTER — Encounter (HOSPITAL_COMMUNITY): Payer: Medicare Other

## 2014-04-16 ENCOUNTER — Encounter (HOSPITAL_COMMUNITY)
Admission: RE | Admit: 2014-04-16 | Discharge: 2014-04-16 | Disposition: A | Discharge status: Home / Self Care | Payer: Medicare Other | Source: Ambulatory Visit | Attending: Internal Medicine | Admitting: Internal Medicine

## 2014-04-16 DIAGNOSIS — Z5189 Encounter for other specified aftercare: Secondary | ICD-10-CM | POA: Diagnosis not present

## 2014-04-16 NOTE — Progress Notes (Signed)
Today, Brittany Crosby exercised at [facility]. Service time was from 1330 to 1450.  The patient exercised for more than 31 minutes performing aerobic, strengthening, and stretching exercises. Oxygen saturation, heart rate, blood pressure, rate of perceived exertion, and shortness of breath were all monitored before, during, and after exercise. Brittany Crosby presented with no problems at today's exercise session.   There was no workload change during today's exercise session.  Pre-exercise vitals: . Weight kg: 69.3 . Liters of O2: ra . SpO2: 100 . HR: 79 . BP: 120/72 . CBG: na  Exercise vitals: . Highest heartrate:  94 . Lowest oxygen saturation: 95 . Highest blood pressure: 110/62 . Liters of 02: ra  Post-exercise vitals: . SpO2: 98 . HR: 68 . BP: 122/60 . Liters of O2: ra . CBG: na  Dr. Kalman ShanMurali Crosby, Medical Director Dr. Butler Denmarkizwan is immediately available during today's [facility] on 04/16/2014 at 1330 class time.

## 2014-04-18 ENCOUNTER — Encounter (HOSPITAL_COMMUNITY): Payer: Medicare Other

## 2014-04-23 ENCOUNTER — Encounter (HOSPITAL_COMMUNITY)
Admission: RE | Admit: 2014-04-23 | Discharge: 2014-04-23 | Disposition: A | Discharge status: Home / Self Care | Payer: Medicare Other | Source: Ambulatory Visit | Attending: Internal Medicine | Admitting: Internal Medicine

## 2014-04-23 DIAGNOSIS — J449 Chronic obstructive pulmonary disease, unspecified: Secondary | ICD-10-CM | POA: Diagnosis not present

## 2014-04-23 DIAGNOSIS — Z5189 Encounter for other specified aftercare: Secondary | ICD-10-CM | POA: Insufficient documentation

## 2014-04-23 DIAGNOSIS — F17200 Nicotine dependence, unspecified, uncomplicated: Secondary | ICD-10-CM | POA: Insufficient documentation

## 2014-04-23 NOTE — Progress Notes (Signed)
Today, Brittany Crosby exercised at [facility]. Service time was from 1330 to 1500.  The patient exercised for more than 31 minutes performing aerobic, strengthening, and stretching exercises. Oxygen saturation, heart rate, blood pressure, rate of perceived exertion, and shortness of breath were all monitored before, during, and after exercise. Brittany Crosby presented with no problems at today's exercise session.   There was a workload change during today's exercise session.  Pre-exercise vitals: . Weight kg: 69.6 . Liters of O2: ra . SpO2: 99 . HR: 80 . BP: 134/66 . CBG: na  Exercise vitals: . Highest heartrate:  89 . Lowest oxygen saturation: 97 . Highest blood pressure: 120/62 . Liters of 02: ra  Post-exercise vitals: . SpO2: 97 . HR: 71 . BP: 112/60 . Liters of O2: ra . CBG: na  Dr. Kalman ShanMurali Ramaswamy, Medical Director Dr. Randol KernElgergawy is immediately available during today's [facility]monary Rehab session [facility] on 04/23/2014 at 1330 class time.

## 2014-04-25 ENCOUNTER — Encounter (HOSPITAL_COMMUNITY)
Admission: RE | Admit: 2014-04-25 | Discharge: 2014-04-25 | Disposition: A | Discharge status: Home / Self Care | Payer: Medicare Other | Source: Ambulatory Visit | Attending: Internal Medicine | Admitting: Internal Medicine

## 2014-04-25 DIAGNOSIS — Z5189 Encounter for other specified aftercare: Secondary | ICD-10-CM | POA: Diagnosis not present

## 2014-04-25 NOTE — Progress Notes (Signed)
Today, Joene exercised at [facility]. Service time was from 1330 to 1550.  The patient exercised for more than 31 minutes performing aerobic, strengthening, and stretching exercises. Oxygen saturation, heart rate, blood pressure, rate of perceived exertion, and shortness of breath were all monitored before, during, and after exercise. Brittany Crosby presented with no problems at today's exercise session. Brittany Crosby also attended an education session on pulmonary medications.  There was a workload change during today's exercise session.  Pre-exercise vitals: . Weight kg: 69.6 . Liters of O2: ra . SpO2: 97 . HR: 65 . BP: 122/66 . CBG: na  Exercise vitals: . Highest heartrate:  82 . Lowest oxygen saturation: 95 . Highest blood pressure: 112/60 . Liters of 02: ra  Post-exercise vitals: . SpO2: 96 . HR: 62 . BP: 114/66 . Liters of O2: ra . CBG: na  Dr. Kalman ShanMurali Ramaswamy, Medical Director Dr. Isidoro Donningai is immediately available during today's [facility] on 04/25/2014 at 1330 class time.

## 2014-04-29 DIAGNOSIS — F329 Major depressive disorder, single episode, unspecified: Secondary | ICD-10-CM | POA: Diagnosis not present

## 2014-04-29 DIAGNOSIS — E780 Pure hypercholesterolemia: Secondary | ICD-10-CM | POA: Diagnosis not present

## 2014-04-29 DIAGNOSIS — Z87891 Personal history of nicotine dependence: Secondary | ICD-10-CM | POA: Diagnosis not present

## 2014-04-29 DIAGNOSIS — Z86010 Personal history of colonic polyps: Secondary | ICD-10-CM | POA: Diagnosis not present

## 2014-04-29 DIAGNOSIS — Z9181 History of falling: Secondary | ICD-10-CM | POA: Diagnosis not present

## 2014-04-29 DIAGNOSIS — E039 Hypothyroidism, unspecified: Secondary | ICD-10-CM | POA: Diagnosis not present

## 2014-04-29 DIAGNOSIS — J449 Chronic obstructive pulmonary disease, unspecified: Secondary | ICD-10-CM | POA: Diagnosis not present

## 2014-04-29 DIAGNOSIS — M859 Disorder of bone density and structure, unspecified: Secondary | ICD-10-CM | POA: Diagnosis not present

## 2014-04-30 ENCOUNTER — Encounter (HOSPITAL_COMMUNITY)
Admission: RE | Admit: 2014-04-30 | Discharge: 2014-04-30 | Disposition: A | Discharge status: Home / Self Care | Payer: Medicare Other | Source: Ambulatory Visit | Attending: Internal Medicine | Admitting: Internal Medicine

## 2014-04-30 DIAGNOSIS — Z5189 Encounter for other specified aftercare: Secondary | ICD-10-CM | POA: Diagnosis not present

## 2014-04-30 NOTE — Progress Notes (Signed)
Today, Alizandra exercised at [facility]. Service time was from 1:30 to 3:15pm.  The patient exercised for more than 31 minutes performing aerobic, strengthening, and stretching exercises. Oxygen saturation, heart rate, blood pressure, rate of perceived exertion, and shortness of breath were all monitored before, during, and after exercise. [name] presented with no problems at today's exercise session.   There was no workload change during today's exercise session.  Pre-exercise vitals: . Weight kg: 70.0 . Liters of O2: ra . SpO2: 97 . HR: 79 . BP: 108/60 . CBG: na  Exercise vitals: . Highest heartrate:  93 . Lowest oxygen saturation: 96 . Highest blood pressure: 118/60 . Liters of 02: ra  Post-exercise vitals: . SpO2: 95 . HR: 73 . BP: 102/62 . Liters of O2: ra . CBG: na  Dr. Kalman ShanMurali Ramaswamy, Medical Director Dr. Isidoro Donningai is immediately available during today's [facility] on 12/815 at 1:30pm class time.

## 2014-05-02 ENCOUNTER — Encounter (HOSPITAL_COMMUNITY)
Admission: RE | Admit: 2014-05-02 | Discharge: 2014-05-02 | Disposition: A | Discharge status: Home / Self Care | Payer: Medicare Other | Source: Ambulatory Visit | Attending: Internal Medicine | Admitting: Internal Medicine

## 2014-05-02 DIAGNOSIS — Z5189 Encounter for other specified aftercare: Secondary | ICD-10-CM | POA: Diagnosis not present

## 2014-05-02 NOTE — Progress Notes (Signed)
Today, Brittany Crosby exercised at [facility]. Service time was from 1340 to 1520.  The patient exercised for more than 31 minutes performing aerobic, strengthening, and stretching exercises. Oxygen saturation, heart rate, blood pressure, rate of perceived exertion, and shortness of breath were all monitored before, during, and after exercise. Brittany Crosby presented with no problems at today's exercise session. Brittany Crosby also attended an education session on advanced directives.  There was no workload change during today's exercise session.  Pre-exercise vitals: . Weight kg: 69.9 . Liters of O2: ra . SpO2: 99 . HR: 74 . BP: 124/60 . CBG: na  Exercise vitals: . Highest heartrate:  87 . Lowest oxygen saturation: 98 . Highest blood pressure: 130/70 . Liters of 02: ra  Post-exercise vitals: . SpO2: 96 . HR: 65 . BP: 98/60 . Liters of O2: ra . CBG: na  Dr. Kalman ShanMurali Ramaswamy, Medical Director Dr. Vanessa BarbaraZamora is immediately available during today's [facility]monary Rehab session [facility] on 05/02/2014 at 1340 class time.05/02/2014

## 2014-05-07 ENCOUNTER — Encounter (HOSPITAL_COMMUNITY): Admission: RE | Admit: 2014-05-07 | Payer: Medicare Other | Source: Ambulatory Visit

## 2014-05-07 ENCOUNTER — Encounter (HOSPITAL_COMMUNITY): Payer: Medicare Other

## 2014-05-09 ENCOUNTER — Encounter (HOSPITAL_COMMUNITY): Payer: Medicare Other

## 2014-05-14 ENCOUNTER — Encounter (HOSPITAL_COMMUNITY)
Admission: RE | Admit: 2014-05-14 | Discharge: 2014-05-14 | Disposition: A | Discharge status: Home / Self Care | Payer: Medicare Other | Source: Ambulatory Visit | Attending: Internal Medicine | Admitting: Internal Medicine

## 2014-05-14 DIAGNOSIS — Z5189 Encounter for other specified aftercare: Secondary | ICD-10-CM | POA: Diagnosis not present

## 2014-05-14 NOTE — Progress Notes (Signed)
Today, Avon exercised at [facility]. Service time was from 1330 to 1445.  The patient exercised for more than 31 minutes performing aerobic, strengthening, and stretching exercises. Oxygen saturation, heart rate, blood pressure, rate of perceived exertion, and shortness of breath were all monitored before, during, and after exercise. Brittany RegalCarol presented with no problems at today's exercise session.   There was no workload change during today's exercise session.  Pre-exercise vitals: . Weight kg: 70.0 . Liters of O2: RA . SpO2: 99 . HR: 80 . BP: 114/60 . CBG: na  Exercise vitals: . Highest heartrate:  101 . Lowest oxygen saturation: 95 . Highest blood pressure: 126/62 . Liters of 02: RA  Post-exercise vitals: . SpO2: 93 . HR: 82 . BP: 112/60 . Liters of O2: RA . CBG: NA Dr. Kalman ShanMurali Ramaswamy, Medical Director Dr. Isidoro Donningai is immediately available during today's Pulmonary Rehab session for [facility] on 05/14/2014 at 1330 class time.  .Marland Kitchen

## 2014-05-16 ENCOUNTER — Encounter (HOSPITAL_COMMUNITY): Payer: Medicare Other

## 2014-05-21 ENCOUNTER — Encounter (HOSPITAL_COMMUNITY): Payer: Medicare Other

## 2014-05-23 ENCOUNTER — Encounter (HOSPITAL_COMMUNITY)
Admission: RE | Admit: 2014-05-23 | Discharge: 2014-05-23 | Disposition: A | Discharge status: Home / Self Care | Payer: Medicare Other | Source: Ambulatory Visit | Attending: Internal Medicine | Admitting: Internal Medicine

## 2014-05-23 DIAGNOSIS — Z5189 Encounter for other specified aftercare: Secondary | ICD-10-CM | POA: Diagnosis not present

## 2014-05-23 NOTE — Progress Notes (Signed)
Today, Jayle exercised at [facility]. Service time was from 1330 to 1530.  The patient exercised for more than 31 minutes performing aerobic, strengthening, and stretching exercises. Oxygen saturation, heart rate, blood pressure, rate of perceived exertion, and shortness of breath were all monitored before, during, and after exercise. Brittany Crosby presented with no problems at today's exercise session. Brittany Crosby also attended an education session on the warning s/s of cardiac and pulmonary illnesses.  There was no workload change during today's exercise session.  Pre-exercise vitals: . Weight kg: 69.7 . Liters of O2: ra . SpO2: 100 . HR: 95 . BP: 120/66 . CBG: na  Exercise vitals: . Highest heartrate:  100 . Lowest oxygen saturation: 97 . Highest blood pressure: 142/66 . Liters of 02: ra  Post-exercise vitals: . SpO2: 97 . HR: 86 . BP: 106/58 . Liters of O2: ra . CBG: na  Dr. Kalman ShanMurali Ramaswamy, Medical Director Dr. Thedore MinsSingh is immediately available during today's Pulmonary Rehab session [facility] on 05/23/2014 at 1330 class time.

## 2014-05-28 ENCOUNTER — Encounter (HOSPITAL_COMMUNITY)
Admission: RE | Admit: 2014-05-28 | Discharge: 2014-05-28 | Disposition: A | Discharge status: Home / Self Care | Payer: Medicare Other | Source: Ambulatory Visit | Attending: Internal Medicine | Admitting: Internal Medicine

## 2014-05-28 DIAGNOSIS — J449 Chronic obstructive pulmonary disease, unspecified: Secondary | ICD-10-CM | POA: Diagnosis not present

## 2014-05-28 DIAGNOSIS — Z5189 Encounter for other specified aftercare: Secondary | ICD-10-CM | POA: Diagnosis not present

## 2014-05-28 DIAGNOSIS — F17200 Nicotine dependence, unspecified, uncomplicated: Secondary | ICD-10-CM | POA: Diagnosis not present

## 2014-05-28 NOTE — Progress Notes (Signed)
Brittany Crosby completed a Six-Minute Walk Test on 05/28/14 . Brittany Crosby walked 808 feet with 0 breaks.  The patient's lowest oxygen saturation was 97% , highest heart rate was 102 , and highest blood pressure was 124/60. The patient was on room air. Brittany Crosby stated that nothing hindered her walk test.

## 2014-05-30 ENCOUNTER — Encounter (HOSPITAL_COMMUNITY): Payer: Medicare Other

## 2014-06-04 ENCOUNTER — Encounter (HOSPITAL_COMMUNITY): Payer: Medicare Other

## 2014-06-06 ENCOUNTER — Encounter (HOSPITAL_COMMUNITY): Payer: Medicare Other

## 2014-06-06 NOTE — Progress Notes (Signed)
Discharge Note from Pulmonary Rehab  Patient has graduated from Pulmonary Rehab.  She was a pleasure to have in the program, her attendance was very regular and she increase  her walking distance by 31%.  She met her goals for the program which were to increase her pulmonary disease knowledge and to increase her strength and pulmonary function.  She will be exercising on her own at home.

## 2014-06-11 ENCOUNTER — Encounter (HOSPITAL_COMMUNITY): Payer: Medicare Other

## 2014-06-13 ENCOUNTER — Encounter (HOSPITAL_COMMUNITY): Payer: Medicare Other

## 2014-06-18 ENCOUNTER — Encounter (HOSPITAL_COMMUNITY): Payer: Medicare Other

## 2014-06-20 ENCOUNTER — Telehealth: Payer: Self-pay | Admitting: Neurology

## 2014-06-20 ENCOUNTER — Ambulatory Visit: Payer: Medicare Other | Admitting: Neurology

## 2014-06-20 ENCOUNTER — Encounter (HOSPITAL_COMMUNITY): Payer: Medicare Other

## 2014-06-20 NOTE — Telephone Encounter (Signed)
Patient is a no show for today's appointment at 06/20/14

## 2014-06-24 ENCOUNTER — Ambulatory Visit (INDEPENDENT_AMBULATORY_CARE_PROVIDER_SITE_OTHER): Payer: Medicare Other | Admitting: Neurology

## 2014-06-24 ENCOUNTER — Encounter: Payer: Self-pay | Admitting: Neurology

## 2014-06-24 VITALS — BP 134/66 | HR 74 | Temp 96.8°F | Ht 62.0 in | Wt 153.0 lb

## 2014-06-24 DIAGNOSIS — R269 Unspecified abnormalities of gait and mobility: Secondary | ICD-10-CM | POA: Diagnosis not present

## 2014-06-24 DIAGNOSIS — R251 Tremor, unspecified: Secondary | ICD-10-CM

## 2014-06-24 DIAGNOSIS — R600 Localized edema: Secondary | ICD-10-CM

## 2014-06-24 DIAGNOSIS — J449 Chronic obstructive pulmonary disease, unspecified: Secondary | ICD-10-CM

## 2014-06-24 MED ORDER — GABAPENTIN 300 MG PO CAPS: 300.0000 mg | ORAL_CAPSULE | Freq: Every day | ORAL | Status: DC

## 2014-06-24 NOTE — Patient Instructions (Addendum)
We will continue to follow your tremor clinically. We will continue with gabapentin 300 mg each night as it has helped some.  Please drink more water and use your cane at all times.

## 2014-06-24 NOTE — Progress Notes (Signed)
Subjective:    Patient ID: Brittany Crosby is a 74 y.o. female.  HPI     Interim history:  Brittany Crosby is a 74 year old right-handed woman with an underlying medical history of lower back pain with history of herniated disc, hyperlipidemia, glaucoma, cataracts, hiatal hernia, depression, anxiety, hypertension, osteopenia, and COPD in the context of prior smoking (quit some 3 years ago), status post tonsillectomy, appendectomy, cholecystectomy, bladder surgery and abdominal hysterectomy, who presents for followup consultation of her right hand tremor. The patient is unaccompanied today. I last saw her on 01/03/2014, at which time I did note a lateralized tremor to the right but no telltale signs of parkinsonism and I asked her to continue with gabapentin. She reported being on oxygen at night and she had started a new inhaler for her COPD which helped her breathing.   Today, she reports feeling about the same with her tremors. She does believe gabapentin helps tone it down some. She has not fallen recently. She forgot to bring her cane today. She has had no recent medication changes.  I first met her on 08/21/2013 at the request of her primary care physician at which time she reported a one-year history of gradual onset and progressive R hand tremor, affecting her handwriting (which has become smaller) and her feeding skills. Some months later, she noted a R foot tremor. At the time of her first visit I felt that there was a chance she had no telltale signs of Parkinson's disease or parkinsonism but this could not be completely excluded. I suggested a brain MRI with and without contrast. She had a brain MRI with and without contrast on 09/17/2013: Mild periventricular and subcortical non-specific gliosis. These findings are non-specific and considerations include autoimmune, inflammatory, post-infectious, microvascular ischemic or migraine associated etiologies. 2. No acute findings. In addition,  personally reviewed the images through the PACS system. We called her with the test results. She called in 4/15 for ongoing tremors and had already reduced her caffeine intake as recommended and I called in low dose gabapentin with titration and she is now on 300 mg qHS. She thinks her tremor is a little better and she has residual LE tremor on the R and mild morning grogginess.    She reported difficulty getting out of a chair and reported balance problems. She reported a total of 4 falls in 12 months. No LOC, no head injury was reported. She reported no FHx of PD, but MGM had MS and daughter has dystonia. Her mother had a head tremor. She denied exposure to pesticides or other harsh chemicals. She worked in [employer]. She does not drink alcohol. She does drink sweet tea and 3 cans of cola per day. She has been on Celexa for the past 2 years. She has no history of neuroleptic medication use or Reglan. She had blood work on 08/16/13: CBC with diff, TSH, ESR and B12.     Her Past Medical History Is Significant For: Past Medical History  Diagnosis Date  . Thyroid disease hypothyroidism  . COPD (chronic obstructive pulmonary disease)   . Glaucoma   . Anxiety   . Hiatal hernia   . Disorder of bone and cartilage, unspecified   . Major depressive disorder, single episode, unspecified     Her Past Surgical History Is Significant For: Past Surgical History  Procedure Laterality Date  . Tonsillectomy    . Appendectomy    . Cholecystectomy    . Abdominal hysterectomy    . Bladder  augmentation    . Back surgery      Her Family History Is Significant For: Family History  Problem Relation Age of Onset  . Dementia Mother   . Heart attack Father     Her Social History Is Significant For: History   Social History  . Marital Status: Single    Spouse Name: [name]    Number of Children: 4  . Years of Education: college   Occupational History  . retired    Social History Main Topics  .  Smoking status: Former Smoker    Types: Cigarettes    Quit date: 11/22/2011  . Smokeless tobacco: Never Used  . Alcohol Use: No  . Drug Use: No  . Sexual Activity: Yes    Birth Control/ Protection: Surgical   Other Topics Concern  . None   Social History Narrative   Patient is right handed, resides with signficant other in a home    Her Allergies Are:  Allergies  Allergen Reactions  . Codeine Other (See Comments)    Abdominal pain  . Sulfa Antibiotics Swelling  . Penicillins Rash  :   Her Current Medications Are:  Outpatient Encounter Prescriptions as of 06/24/2014  Medication Sig  . aspirin EC 81 MG tablet Take 81 mg by mouth daily.  . budesonide-formoterol (SYMBICORT) 160-4.5 MCG/ACT inhaler Inhale 1 puff into the lungs 2 (two) times daily.  . citalopram (CELEXA) 20 MG tablet Take 20 mg by mouth daily.  Marland Kitchen gabapentin (NEURONTIN) 100 MG capsule Take 3 pills each night  . latanoprost (XALATAN) 0.005 % ophthalmic solution Place 1 drop into both eyes at bedtime.  Marland Kitchen levothyroxine (SYNTHROID, LEVOTHROID) 88 MCG tablet Take 88 mcg by mouth daily.  . Multiple Vitamins-Minerals (ONE-A-DAY WOMENS 50+ ADVANTAGE PO) Take 1 tablet by mouth daily.  . NON FORMULARY 2lpm at bedtime.  Donell Sievert IN Inhale into the lungs. daily  . PROAIR HFA 108 (90 BASE) MCG/ACT inhaler   . Spacer/Aero-Holding Chambers (AEROCHAMBER MV) inhaler Use as instructed  :  Review of Systems:  Out of a complete 14 point review of systems, all are reviewed and negative with the exception of these symptoms as listed below:  Review of Systems  Neurological:       COPD because of the weather    Objective:  Neurologic Exam  Physical Exam Physical Examination:   Filed Vitals:   06/24/14 1053  BP: 134/66  Pulse: 74  Temp: 96.8 F (36 C)   General Examination: The patient is a very pleasant 74 y.o. female in no acute distress.  HEENT: Normocephalic, atraumatic, pupils are equal, round and reactive  to light and accommodation. Funduscopic exam is normal with sharp disc margins noted. She has had bilateral cataract repairs. Extraocular tracking shows no significant saccadic breakdown without nystagmus noted. There is no limitation to her gaze. There is no decrease in eye blink rate. Hearing is intact. Face is symmetric with slight facial masking and normal facial sensation. There is no lip, neck or jaw tremor. Neck is not rigid with intact passive ROM. There are no carotid bruits on auscultation. Oropharynx exam reveals moderate mouth dryness. Moderate airway crowding is noted, due to narrow airway and thick soft palate. She has dentures in the upper and is nearly edentulous in the lower. She  Mallampati is class II. Tongue protrudes centrally and palate elevates symmetrically. There is no drooling.   Chest: is clear to auscultation without wheezing, rhonchi or crackles noted.  Heart: sounds are  regular and normal without murmurs, rubs or gallops noted.   Abdomen: is soft, non-tender and non-distended with normal bowel sounds appreciated on auscultation.  Extremities: There is trace to 1+ pitting edema in the distal lower extremities bilaterally. Pedal pulses are intact. There are no varicose veins.  Skin: is warm and dry with no trophic changes noted. Age-related changes are noted on the skin.   Musculoskeletal: exam reveals no obvious joint deformities, tenderness, joint swelling or erythema.  Neurologically:   Mental status: The patient is awake and alert, paying good  attention. She is able to completely provide the history. She is oriented to: person, place, time/date, situation, day of week, month of year and year. Her memory, attention, language and knowledge are intact. There is no aphasia, agnosia, apraxia or anomia. There is a no evidence of bradyphrenia. Speech is not hypophonic with no dysarthria noted. Mood is congruent and affect is normal.   Cranial nerves are as described above  under HEENT exam. In addition, shoulder shrug is normal with equal shoulder height noted.  Motor exam: Normal bulk, and strength for age is noted. There are no dyskinesias noted.  Tone is not particularly rigid with absence of cogwheeling in the limbs. There is overall very slight bradykinesia. There is no drift or rebound.  There is a slight intermittent resting tremor in the right lower extremity and no resting tremor in the right upper extremity. The tremor seems just a little bit faster than the typical Parkinson's tremor. Handwriting is tremulous but not micrographic. She has a mild postural tremor in the right upper extremity, this is intermittent and of faster frequency. Romberg is negative, but shows some swaying, unchanged.  Reflexes are 2+ in the upper extremities and 2+ in the lower extremities.  Fine motor skills exam: Finger taps are Slightly impaired on the right and Not impaired on the left. Hand movements are Slightly impaired on the right and Not impaired on the left. RAP (rapid alternating patting) is Slightly impaired on the right and Not impaired on the left. Foot taps are mildly impaired on the right and not impaired on the left. Foot agility (in the form of heel stomping) is mildly impaired on the right and Not impaired on the left.    Cerebellar testing shows no dysmetria or intention tremor on finger to nose testing. Heel to shin is unremarkable bilaterally. There is no truncal or gait ataxia.   Sensory exam is intact to light touch, pinprick, vibration, temperature sense in the upper and lower extremities.   Gait, station and balance: She stands up from the seated position with mild difficulty and does need to push up with Her hands. She needs no assistance, but takes 2 attempts. No veering to one side is noted. She is not noted to lean to the side. Posture is mildly stooped, possibly age-appropriate, unchanged. Stance is narrow-based. She walks with decrease in stride length  and pace and decreased arm swing on the right, unchanged. She turns in 3 steps. Tandem walk is possible with difficulty. Balance is mildly impaired.     Assessment and Plan:   In summary, Brittany Crosby is a very pleasant 74 year old female with an underlying medical history of lower back pain with history of herniated disc, hyperlipidemia, glaucoma, cataracts, hiatal hernia, depression, anxiety, hypertension, osteopenia, emphysema and COPD in the context of prior smoking (quit some 3 years ago), status post tonsillectomy, appendectomy, cholecystectomy, bladder surgery and abdominal hysterectomy, who reports an approximately 2 year history  of right-sided tremors, particularly in the right foot. Her exam is stable from before. While she does have lateralizing findings on the right side, I do not think findings are telltale for parkinsonism. She has a somewhat faster tremor. She has not a whole lot of increase in tone, and there is a slight degree of variability in her exam. Nevertheless we cannot fully exclude that she has a mild form of parkinsonism or even right-sided predominant Parkinson's disease, but exam has remained stable and she feels, the gabapentin has helped. Sometimes an SSRI type antidepressants can bring on tremors. It would be unusual to bring on an asymmetrical tremor. At this juncture, I would like for her to continue with low dose gabapentin and she is advised to use her cane and drink more water. I would like to see her back in 6 months. I renewed her prescription for gabapentin. She was in agreement with the plan.

## 2014-07-03 DIAGNOSIS — H4011X1 Primary open-angle glaucoma, mild stage: Secondary | ICD-10-CM | POA: Diagnosis not present

## 2014-07-08 ENCOUNTER — Telehealth: Payer: Self-pay | Admitting: Pulmonary Disease

## 2014-07-08 NOTE — Telephone Encounter (Signed)
Pt stated that her symbicort is too expensive.  She cannot afford this.  Pt is requesting that something else be called in for her.  BQ please advise. Thanks  Allergies  Allergen Reactions  . Codeine Other (See Comments)    Abdominal pain  . Sulfa Antibiotics Swelling  . Penicillins Rash    Current Outpatient Prescriptions on File Prior to Visit  Medication Sig Dispense Refill  . aspirin EC 81 MG tablet Take 81 mg by mouth daily.    . budesonide-formoterol (SYMBICORT) 160-4.5 MCG/ACT inhaler Inhale 1 puff into the lungs 2 (two) times daily. 1 Inhaler 6  . citalopram (CELEXA) 20 MG tablet Take 20 mg by mouth daily.    Marland Kitchen. gabapentin (NEURONTIN) 300 MG capsule Take 1 capsule (300 mg total) by mouth at bedtime. 30 capsule 5  . latanoprost (XALATAN) 0.005 % ophthalmic solution Place 1 drop into both eyes at bedtime.    Marland Kitchen. levothyroxine (SYNTHROID, LEVOTHROID) 88 MCG tablet Take 88 mcg by mouth daily.    . Multiple Vitamins-Minerals (ONE-A-DAY WOMENS 50+ ADVANTAGE PO) Take 1 tablet by mouth daily.    . NON FORMULARY 2lpm at bedtime.    Docia Barrier. OXYGEN-HELIUM IN Inhale into the lungs. daily    . PROAIR HFA 108 (90 BASE) MCG/ACT inhaler   2  . Spacer/Aero-Holding Chambers (AEROCHAMBER MV) inhaler Use as instructed 1 each 0   No current facility-administered medications on file prior to visit.

## 2014-07-09 NOTE — Telephone Encounter (Signed)
Pt returned call. Pt is angry she did not get a call back yesterday. [phone]

## 2014-07-09 NOTE — Telephone Encounter (Signed)
Please let her know that symbicort offers a coupon so that she only has to pay $25/month If this does not work then she needs to tell us what equivalent medication is on her insurance formulary

## 2014-07-09 NOTE — Telephone Encounter (Signed)
Pt is aware of BQ's recommendations. Coupon will be left a the front desk for pt to pick up. Nothing further was needed.

## 2014-08-15 ENCOUNTER — Encounter: Payer: Self-pay | Admitting: Pulmonary Disease

## 2014-08-15 ENCOUNTER — Ambulatory Visit (INDEPENDENT_AMBULATORY_CARE_PROVIDER_SITE_OTHER): Payer: Medicare Other | Admitting: Pulmonary Disease

## 2014-08-15 VITALS — BP 126/68 | HR 76 | Ht 62.0 in | Wt 155.0 lb

## 2014-08-15 DIAGNOSIS — Z720 Tobacco use: Secondary | ICD-10-CM

## 2014-08-15 DIAGNOSIS — J449 Chronic obstructive pulmonary disease, unspecified: Secondary | ICD-10-CM

## 2014-08-15 NOTE — Patient Instructions (Signed)
Keep taking the symbicort as you are doing Ask your primary care physician about whether or not you have had the Pneumovax We will see you back in a year

## 2014-08-15 NOTE — Progress Notes (Signed)
   Subjective:    Patient ID: Brittany Crosby, female    DOB: 10-10-1940, 74 y.o.   MRN: [id number]  Synopsis: GOLD Grade C COPD, severe airflow obstruction, has gluacoma  HPI Chief Complaint  Patient presents with  . Follow-up    Pt has no compliants at this time.  CAT score 13.   Brittany Crosby had some trouble breathing this winter with the cold weather but nothing serious, no prednisone or antibiotics.  She has some allergies right now.  No visual changes.  She is taking symbicort 1 puff twice a day still.  Her ocular pressures have bene fine recently.   Past Medical History  Diagnosis Date  . Thyroid disease hypothyroidism  . COPD (chronic obstructive pulmonary disease)   . Glaucoma   . Anxiety   . Hiatal hernia   . Disorder of bone and cartilage, unspecified   . Major depressive disorder, single episode, unspecified   . Hypercholesteremia   . Nicotine dependence     history  . H/O falling   . Colon polyps   . Glaucoma     unspecified     Review of Systems     Objective:   Physical Exam Filed Vitals:   08/15/14 1332  BP: 126/68  Pulse: 76  Height: 5\' 2"  (1.575 m)  Weight: 155 lb (70.308 kg)  SpO2: 98%  RA  Gen: well appearing, no acute distress HEENT: NCAT,  EOMi, OP clear,  PULM: CTA B CV: RRR, no mgr, no JVD AB: BS+, soft, nontender,  Ext: warm, no edema, no clubbing, no cyanosis Derm: no rash or skin breakdown Neuro: A&Ox4, MAEW        Assessment & Plan:   COPD, severe This has been a stable interval for her.   Plan: -continue Symbicort as you are doing -check to see if you have had the Pneumovax vaccine   Tobacco abuse Her 12/2013 CT was OK, RADS-2. She will have another in 12/2014 and annally until 2021 (15 years after quitting).     Updated Medication List Outpatient Encounter Prescriptions as of 08/15/2014  Medication Sig  . aspirin EC 81 MG tablet Take 81 mg by mouth daily.  . budesonide-formoterol (SYMBICORT) 160-4.5 MCG/ACT inhaler  Inhale 1 puff into the lungs 2 (two) times daily.  . citalopram (CELEXA) 20 MG tablet Take 20 mg by mouth daily.  Marland Kitchen. gabapentin (NEURONTIN) 300 MG capsule Take 1 capsule (300 mg total) by mouth at bedtime.  Marland Kitchen. latanoprost (XALATAN) 0.005 % ophthalmic solution Place 1 drop into both eyes at bedtime.  Marland Kitchen. levothyroxine (SYNTHROID, LEVOTHROID) 88 MCG tablet Take 88 mcg by mouth daily.  . Multiple Vitamins-Minerals (ONE-A-DAY WOMENS 50+ ADVANTAGE PO) Take 1 tablet by mouth daily.  . NON FORMULARY 2lpm at bedtime.  Marland Kitchen. PROAIR HFA 108 (90 BASE) MCG/ACT inhaler   . Spacer/Aero-Holding Chambers (AEROCHAMBER MV) inhaler Use as instructed  . [DISCONTINUED] OXYGEN-HELIUM IN Inhale into the lungs. daily

## 2014-08-15 NOTE — Addendum Note (Signed)
Addended by: Velvet BatheAULFIELD, Ortencia Askari L on: 08/15/2014 03:19 PM   Modules accepted: Orders

## 2014-08-15 NOTE — Assessment & Plan Note (Signed)
This has been a stable interval for her.   Plan: -continue Symbicort as you are doing -check to see if you have had the Pneumovax vaccine

## 2014-08-15 NOTE — Assessment & Plan Note (Signed)
Her 12/2013 CT was OK, RADS-2. She will have another in 12/2014 and annally until 2021 (15 years after quitting).

## 2014-08-27 DIAGNOSIS — R1013 Epigastric pain: Secondary | ICD-10-CM | POA: Diagnosis not present

## 2014-09-10 DIAGNOSIS — R1013 Epigastric pain: Secondary | ICD-10-CM | POA: Diagnosis not present

## 2014-10-02 DIAGNOSIS — H2510 Age-related nuclear cataract, unspecified eye: Secondary | ICD-10-CM | POA: Diagnosis not present

## 2014-11-08 DIAGNOSIS — E039 Hypothyroidism, unspecified: Secondary | ICD-10-CM | POA: Diagnosis not present

## 2014-11-08 DIAGNOSIS — F329 Major depressive disorder, single episode, unspecified: Secondary | ICD-10-CM | POA: Diagnosis not present

## 2014-11-08 DIAGNOSIS — J449 Chronic obstructive pulmonary disease, unspecified: Secondary | ICD-10-CM | POA: Diagnosis not present

## 2014-11-08 DIAGNOSIS — H409 Unspecified glaucoma: Secondary | ICD-10-CM | POA: Diagnosis not present

## 2014-11-08 DIAGNOSIS — Z87891 Personal history of nicotine dependence: Secondary | ICD-10-CM | POA: Diagnosis not present

## 2014-11-08 DIAGNOSIS — Z0000 Encounter for general adult medical examination without abnormal findings: Secondary | ICD-10-CM | POA: Diagnosis not present

## 2014-11-08 DIAGNOSIS — E780 Pure hypercholesterolemia: Secondary | ICD-10-CM | POA: Diagnosis not present

## 2014-11-08 DIAGNOSIS — M859 Disorder of bone density and structure, unspecified: Secondary | ICD-10-CM | POA: Diagnosis not present

## 2014-11-08 DIAGNOSIS — Z79899 Other long term (current) drug therapy: Secondary | ICD-10-CM | POA: Diagnosis not present

## 2014-12-10 DIAGNOSIS — M79604 Pain in right leg: Secondary | ICD-10-CM | POA: Diagnosis not present

## 2014-12-10 DIAGNOSIS — M79605 Pain in left leg: Secondary | ICD-10-CM | POA: Diagnosis not present

## 2014-12-23 ENCOUNTER — Ambulatory Visit (INDEPENDENT_AMBULATORY_CARE_PROVIDER_SITE_OTHER): Payer: Medicare Other | Admitting: Neurology

## 2014-12-23 ENCOUNTER — Encounter: Payer: Self-pay | Admitting: Neurology

## 2014-12-23 VITALS — BP 130/68 | HR 68 | Resp 16 | Ht 62.0 in | Wt 150.0 lb

## 2014-12-23 DIAGNOSIS — R600 Localized edema: Secondary | ICD-10-CM

## 2014-12-23 DIAGNOSIS — G20 Parkinson's disease: Secondary | ICD-10-CM

## 2014-12-23 MED ORDER — CARBIDOPA-LEVODOPA 25-100 MG PO TABS: ORAL_TABLET | ORAL | Status: DC

## 2014-12-23 NOTE — Patient Instructions (Signed)
I think you have developed more signs in keeping with Parkinson's disease: let's try a medication, that may help your tremor, stiffness and slowness:  Sinemet (generic name: carbidopa-levodopa) 25/100 mg: Take half a pill twice daily (8 AM and noon) for one week, then half a pill 3 times a day (8 AM, noon, and 4 PM) for one week, then one pill 3 times a day thereafter. Please try to take the medication away from you mealtimes, that is, ideally either one hour before or 2 hours after your meal to ensure optimal absorption. The medication can interfere with the protein content of your meal and trying to the protein in your food and therefore not get fully absorbed.  Common side effects reported are: Nausea, vomiting, sedation, confusion, lightheadedness. Rare side effects include hallucinations, severe nausea or vomiting, diarrhea and significant drop in blood pressure especially when going from lying to standing or from sitting to standing.

## 2014-12-23 NOTE — Progress Notes (Signed)
Subjective:    Patient ID: Brittany Crosby is a 74 y.o. female.  HPI     Interim history:   Ms. Brittany Crosby is a 74 year old right-handed woman with an underlying medical history of lower back pain with history of herniated disc, hyperlipidemia, glaucoma, cataracts, hiatal hernia, depression, anxiety, hypertension, osteopenia, and COPD in the context of prior smoking (quit some 3 years ago), status post tonsillectomy, appendectomy, cholecystectomy, bladder surgery and abdominal hysterectomy, who presents for followup consultation of her right hand tremor. The patient is unaccompanied today. I last saw her on 06/24/2014, at which time she reported feeling about the same with her tremors. She felt that the gabapentin had helped some. She had no recent falls. I felt her exam was fairly stable and I asked her to continue with low dose gabapentin.  Today, 12/23/2014: She reports more difficulty with her right side. She has more insecurity with walking. She has more stiffness. She has more difficulty with fine motor skills. She has been using a cane. Thankfully she has not fallen. She has noted more difficulty with dressing especially with tying her shoes.  Previously:  I saw her on 01/03/2014, at which time I did note a lateralized tremor to the right but no telltale signs of parkinsonism and I asked her to continue with gabapentin. She reported being on oxygen at night and she had started a new inhaler for her COPD which helped her breathing.   I first met her on 08/21/2013 at the request of her primary care physician at which time she reported a one-year history of gradual onset and progressive R hand tremor, affecting her handwriting (which has become smaller) and her feeding skills. Some months later, she noted a R foot tremor. At the time of her first visit I felt that there was a chance she had no telltale signs of Parkinson's disease or parkinsonism but this could not be completely excluded. I  suggested a brain MRI with and without contrast. She had a brain MRI with and without contrast on 09/17/2013: Mild periventricular and subcortical non-specific gliosis. These findings are non-specific and considerations include autoimmune, inflammatory, post-infectious, microvascular ischemic or migraine associated etiologies. 2. No acute findings. In addition, personally reviewed the images through the PACS system. We called her with the test results. She called in 4/15 for ongoing tremors and had already reduced her caffeine intake as recommended and I called in low dose gabapentin with titration and she is now on 300 mg qHS. She thinks her tremor is a little better and she has residual LE tremor on the R and mild morning grogginess.    She reported difficulty getting out of a chair and reported balance problems. She reported a total of 4 falls in 12 months. No LOC, no head injury was reported. She reported no FHx of PD, but MGM had MS and daughter has dystonia. Her mother had a head tremor. She denied exposure to pesticides or other harsh chemicals. She worked in [employer]. She does not drink alcohol. She does drink sweet tea and 3 cans of cola per day. She has been on Celexa for the past 2 years. She has no history of neuroleptic medication use or Reglan. She had blood work on 08/16/13: CBC with diff, TSH, ESR and B12.  Her Past Medical History Is Significant For: Past Medical History  Diagnosis Date  . Thyroid disease hypothyroidism  . COPD (chronic obstructive pulmonary disease)   . Glaucoma   . Anxiety   . Hiatal  hernia   . Disorder of bone and cartilage, unspecified   . Major depressive disorder, single episode, unspecified   . Hypercholesteremia   . Nicotine dependence     history  . H/O falling   . Colon polyps   . Glaucoma     unspecified    Her Past Surgical History Is Significant For: Past Surgical History  Procedure Laterality Date  . Tonsillectomy    . Appendectomy    .  Cholecystectomy    . Abdominal hysterectomy    . Bladder augmentation    . Back surgery      Her Family History Is Significant For: Family History  Problem Relation Age of Onset  . Dementia Mother   . Heart attack Father     Her Social History Is Significant For: History   Social History  . Marital Status: Single    Spouse Name: [name]  . Number of Children: 4  . Years of Education: college   Occupational History  . retired    Social History Main Topics  . Smoking status: Former Smoker    Types: Cigarettes    Quit date: 11/22/2011  . Smokeless tobacco: Never Used  . Alcohol Use: No  . Drug Use: No  . Sexual Activity: Yes    Birth Control/ Protection: Surgical   Other Topics Concern  . None   Social History Narrative   Patient is right handed, resides with signficant other in a home    Her Allergies Are:  Allergies  Allergen Reactions  . Codeine Other (See Comments)    Abdominal pain  . Sulfa Antibiotics Swelling  . Penicillins Rash  :   Her Current Medications Are:  Outpatient Encounter Prescriptions as of 12/23/2014  Medication Sig  . aspirin EC 81 MG tablet Take 81 mg by mouth daily.  . budesonide-formoterol (SYMBICORT) 160-4.5 MCG/ACT inhaler Inhale 1 puff into the lungs 2 (two) times daily.  . citalopram (CELEXA) 40 MG tablet Take 40 mg by mouth daily.  Marland Kitchen gabapentin (NEURONTIN) 300 MG capsule Take 1 capsule (300 mg total) by mouth at bedtime.  Marland Kitchen latanoprost (XALATAN) 0.005 % ophthalmic solution Place 1 drop into both eyes at bedtime.  Marland Kitchen levothyroxine (SYNTHROID, LEVOTHROID) 88 MCG tablet Take 88 mcg by mouth daily.  . Multiple Vitamins-Minerals (ONE-A-DAY WOMENS 50+ ADVANTAGE PO) Take 1 tablet by mouth daily.  . NON FORMULARY 2lpm at bedtime.  Marland Kitchen PROAIR HFA 108 (90 BASE) MCG/ACT inhaler   . Spacer/Aero-Holding Chambers (AEROCHAMBER MV) inhaler Use as instructed  . [DISCONTINUED] citalopram (CELEXA) 20 MG tablet Take 20 mg by mouth daily.   No  facility-administered encounter medications on file as of 12/23/2014.  :  Review of Systems:  Out of a complete 14 point review of systems, all are reviewed and negative with the exception of these symptoms as listed below:  Review of Systems  Musculoskeletal:       Soreness in upper R thigh   Neurological: Positive for tremors.       Patient feels like tremors are worse, more trouble writing and eating with utensils. Increased weakness in right foot. She has noticed that her right arm and hand will "lock up" while walking.       Objective:  Neurologic Exam  Physical Exam Physical Examination:   Filed Vitals:   12/23/14 1255  BP: 130/68  Pulse: 68  Resp: 16   General Examination: The patient is a very pleasant 74 y.o. female in no acute distress.  HEENT: Normocephalic, atraumatic, pupils are equal, round and reactive to light and accommodation. Funduscopic exam is normal with sharp disc margins noted. She has had bilateral cataract repairs. Extraocular tracking shows mild saccadic breakdown without nystagmus noted. There is no limitation to her gaze. There is no decrease in eye blink rate. Hearing is intact. Face is symmetric with slight facial masking and normal facial sensation. There is no lip, neck or jaw tremor. Neck is mildly with intact passive ROM. There are no carotid bruits on auscultation. Oropharynx exam reveals moderate mouth dryness. Moderate airway crowding is noted, due to narrow airway and thick soft palate. She has dentures in the upper and is nearly edentulous in the lower. She  Mallampati is class II. Tongue protrudes centrally and palate elevates symmetrically. There is no drooling.   Chest: is clear to auscultation without wheezing, rhonchi or crackles noted.  Heart: sounds are regular and normal without murmurs, rubs or gallops noted.   Abdomen: is soft, non-tender and non-distended with normal bowel sounds appreciated on auscultation.  Extremities: There is  trace to trace pitting edema in the distal lower extremities bilaterally. Pedal pulses are intact. There are no varicose veins.  Skin: is warm and dry with no trophic changes noted. Age-related changes are noted on the skin.   Musculoskeletal: exam reveals no obvious joint deformities, tenderness, joint swelling or erythema.  Neurologically:   Mental status: The patient is awake and alert, paying good  attention. She is able to completely provide the history. She is oriented to: person, place, time/date, situation, day of week, month of year and year. Her memory, attention, language and knowledge are intact. There is no aphasia, agnosia, apraxia or anomia. There is a no evidence of bradyphrenia. Speech is mildly hypophonic with no dysarthria noted. Mood is congruent and affect is normal.   Cranial nerves are as described above under HEENT exam. In addition, shoulder shrug is normal with equal shoulder height noted.  Motor exam: Normal bulk, and strength for age is noted. There are no dyskinesias noted.  Tone is  increase in the right upper and lower extremities with cogwheeling noted. There is mild bradykinesia. There is no drift or rebound.  There is a  fairly persistent resting tremor on the right lower extremity and an intermittent resting tremor in the right upper extremity. She has no resting tremor on the left. Romberg is negative, but shows some swaying, unchanged.  Reflexes are 2+ in the upper extremities and 2+ in the lower extremities.  Fine motor skills exam: Finger taps, rapid alternating tapping and hand movements are all mild to moderately impaired on the right and minimally to not impaired on the left. The same is true for lower body foot agility and foot tapping. These are progressive changes from last time.  Cerebellar testing shows no dysmetria or intention tremor on finger to nose testing. Heel to shin is unremarkable bilaterally, but slow. There is no truncal or gait ataxia.    Sensory exam is intact to light touch in the upper and lower extremities.   Gait, station and balance: She stands up from the seated position with more difficulty and does need to push up with Her hands. She needs no assistance, but takes 2 attempts. No veering to one side is noted. She is not noted to lean to the side. Posture is mild to moderately stooped, worse than last time. possibly age-appropriate, unchanged. Stance is narrow-based. She walks with decrease in stride length and pace and decreased  arm swing on the right, unchanged. She turns in 3 steps. Tandem walk is not possible. Balance is mildly impaired.     Assessment and Plan:   In summary, Amylah Will is a very pleasant 74 year old female with an underlying medical history of lower back pain with history of herniated disc, hyperlipidemia, glaucoma, cataracts, hiatal hernia, depression, anxiety, hypertension, osteopenia, emphysema and COPD in the context of prior smoking (quit some 3 years ago), status post tonsillectomy, appendectomy, cholecystectomy, bladder surgery and abdominal hysterectomy, who reports an approximately 2 to 3 year history of right-sided tremors, particularly in the right foot. She has had more recent  progression in her symptoms and that she has noticed stiffness on the right side, more fine motor difficulties, and more difficulty with her gait. Her history and exam are more telltale for parkinsonism now. She has signs and symptoms of right-sided predominant Parkinson's disease. She has certainly progressed on my exam as well. Today, I suggested we introduced a small dose of Sinemet generic, 25-100 milligrams strength starting with half a pill twice daily with gradual titration. I provided her with a new prescription as well as written instructions.  She is advised to continue to use her cane for gait safety. She is advised to follow-up in 3-4 months, sooner if needed. She is encouraged to call for any interim  questions or concerns.  I answered all her questions today and she was in agreement.  I spent 20 minutes in total face-to-face time with the patient, more than 50% of which was spent in counseling and coordination of care, reviewing test results, reviewing medication and discussing or reviewing the diagnosis of PD, its prognosis and treatment options.

## 2015-01-06 ENCOUNTER — Other Ambulatory Visit: Payer: Self-pay

## 2015-01-06 DIAGNOSIS — Z1231 Encounter for screening mammogram for malignant neoplasm of breast: Secondary | ICD-10-CM

## 2015-01-06 DIAGNOSIS — H43393 Other vitreous opacities, bilateral: Secondary | ICD-10-CM | POA: Diagnosis not present

## 2015-01-07 ENCOUNTER — Telehealth: Payer: Self-pay | Admitting: Neurology

## 2015-01-07 NOTE — Telephone Encounter (Signed)
Left message to call back  

## 2015-01-07 NOTE — Telephone Encounter (Signed)
She has mild signs, i e is in the early stage, has a good chance for a functional, good quality life and it is certainly not fatal. Please call and reassure. I will see her back in FU as planned, sooner if she needs to. How is the medication, sinemet low dose going?

## 2015-01-07 NOTE — Telephone Encounter (Signed)
Patient last seen 12/23/14. Please see questions below, what do you want me to advise the patient?

## 2015-01-07 NOTE — Telephone Encounter (Signed)
Patient called stating she has had time to process her dx of Parkinson's, she now has questions. She is inquiring if  1.it is fatal? 2. What stage is it? 3. How long is her quality of life? Please call and advise. Patient can be reached at [phone].

## 2015-01-07 NOTE — Telephone Encounter (Signed)
Patient returned your call [phone]

## 2015-01-07 NOTE — Telephone Encounter (Signed)
I spoke to [name]. I gave her Dr. Teofilo Pod advice below. Patient stated that she feels reassured. She will call us back if any further questions. Brittany Crosby states that she is doing well on the Sinemet, reports some dizziness after morning dose.

## 2015-01-08 DIAGNOSIS — Z23 Encounter for immunization: Secondary | ICD-10-CM | POA: Diagnosis not present

## 2015-01-08 DIAGNOSIS — G20 Parkinson's disease: Secondary | ICD-10-CM | POA: Diagnosis not present

## 2015-01-08 DIAGNOSIS — F329 Major depressive disorder, single episode, unspecified: Secondary | ICD-10-CM | POA: Diagnosis not present

## 2015-01-09 NOTE — Telephone Encounter (Signed)
I spoke to patient. And per last office note and verbal confirmation with Dr. Frances Furbish. Patient is to take both for now and we will revisit at next appt.

## 2015-01-09 NOTE — Telephone Encounter (Signed)
Patient called, is wondering since she is taking Sinemet, should she still be taking Gabapentin?

## 2015-01-10 ENCOUNTER — Ambulatory Visit: Payer: Medicare Other

## 2015-01-14 ENCOUNTER — Ambulatory Visit (INDEPENDENT_AMBULATORY_CARE_PROVIDER_SITE_OTHER)
Admission: RE | Admit: 2015-01-14 | Discharge: 2015-01-14 | Disposition: A | Discharge status: Home / Self Care | Payer: Medicare Other | Source: Ambulatory Visit | Attending: Pulmonary Disease | Admitting: Pulmonary Disease

## 2015-01-14 DIAGNOSIS — Z87891 Personal history of nicotine dependence: Secondary | ICD-10-CM | POA: Diagnosis not present

## 2015-01-14 DIAGNOSIS — Z720 Tobacco use: Secondary | ICD-10-CM

## 2015-01-16 ENCOUNTER — Telehealth: Payer: Self-pay | Admitting: Pulmonary Disease

## 2015-01-16 NOTE — Telephone Encounter (Signed)
Pt calling for CT results from Monday 01/13/15. Nothing further needed.  Notes Recorded by Lupita Leash, MD on 01/15/2015 at 12:58 PM Morrie Sheldon, Please let her know that this was okay Thanks, Kipp Brood

## 2015-02-07 ENCOUNTER — Ambulatory Visit
Admission: RE | Admit: 2015-02-07 | Discharge: 2015-02-07 | Disposition: A | Discharge status: Home / Self Care | Payer: Medicare Other | Source: Ambulatory Visit

## 2015-02-07 DIAGNOSIS — Z1231 Encounter for screening mammogram for malignant neoplasm of breast: Secondary | ICD-10-CM

## 2015-02-16 ENCOUNTER — Other Ambulatory Visit: Payer: Self-pay | Admitting: Neurology

## 2015-03-16 ENCOUNTER — Other Ambulatory Visit: Payer: Self-pay | Admitting: Pulmonary Disease

## 2015-03-18 ENCOUNTER — Other Ambulatory Visit: Payer: Self-pay | Admitting: Pulmonary Disease

## 2015-03-23 ENCOUNTER — Encounter (HOSPITAL_COMMUNITY): Payer: Self-pay | Admitting: Emergency Medicine

## 2015-03-23 ENCOUNTER — Emergency Department (HOSPITAL_COMMUNITY)
Admission: EM | Admit: 2015-03-23 | Discharge: 2015-03-24 | Disposition: A | Discharge status: Home / Self Care | Payer: Medicare Other | Attending: Emergency Medicine | Admitting: Emergency Medicine

## 2015-03-23 DIAGNOSIS — Y998 Other external cause status: Secondary | ICD-10-CM | POA: Insufficient documentation

## 2015-03-23 DIAGNOSIS — S20212A Contusion of left front wall of thorax, initial encounter: Secondary | ICD-10-CM | POA: Diagnosis not present

## 2015-03-23 DIAGNOSIS — E0789 Other specified disorders of thyroid: Secondary | ICD-10-CM | POA: Diagnosis not present

## 2015-03-23 DIAGNOSIS — M25552 Pain in left hip: Secondary | ICD-10-CM | POA: Diagnosis not present

## 2015-03-23 DIAGNOSIS — J449 Chronic obstructive pulmonary disease, unspecified: Secondary | ICD-10-CM | POA: Insufficient documentation

## 2015-03-23 DIAGNOSIS — S298XXA Other specified injuries of thorax, initial encounter: Secondary | ICD-10-CM | POA: Diagnosis not present

## 2015-03-23 DIAGNOSIS — F419 Anxiety disorder, unspecified: Secondary | ICD-10-CM | POA: Diagnosis not present

## 2015-03-23 DIAGNOSIS — S199XXA Unspecified injury of neck, initial encounter: Secondary | ICD-10-CM | POA: Insufficient documentation

## 2015-03-23 DIAGNOSIS — Z9889 Other specified postprocedural states: Secondary | ICD-10-CM | POA: Insufficient documentation

## 2015-03-23 DIAGNOSIS — W19XXXA Unspecified fall, initial encounter: Secondary | ICD-10-CM

## 2015-03-23 DIAGNOSIS — S3992XA Unspecified injury of lower back, initial encounter: Secondary | ICD-10-CM | POA: Diagnosis present

## 2015-03-23 DIAGNOSIS — S20222A Contusion of left back wall of thorax, initial encounter: Secondary | ICD-10-CM | POA: Diagnosis not present

## 2015-03-23 DIAGNOSIS — S300XXA Contusion of lower back and pelvis, initial encounter: Secondary | ICD-10-CM | POA: Insufficient documentation

## 2015-03-23 DIAGNOSIS — H409 Unspecified glaucoma: Secondary | ICD-10-CM | POA: Insufficient documentation

## 2015-03-23 DIAGNOSIS — Y9289 Other specified places as the place of occurrence of the external cause: Secondary | ICD-10-CM | POA: Diagnosis not present

## 2015-03-23 DIAGNOSIS — Z87891 Personal history of nicotine dependence: Secondary | ICD-10-CM | POA: Diagnosis not present

## 2015-03-23 DIAGNOSIS — F329 Major depressive disorder, single episode, unspecified: Secondary | ICD-10-CM | POA: Insufficient documentation

## 2015-03-23 DIAGNOSIS — Y9389 Activity, other specified: Secondary | ICD-10-CM | POA: Diagnosis not present

## 2015-03-23 DIAGNOSIS — S0990XA Unspecified injury of head, initial encounter: Secondary | ICD-10-CM | POA: Diagnosis not present

## 2015-03-23 DIAGNOSIS — Z79899 Other long term (current) drug therapy: Secondary | ICD-10-CM | POA: Insufficient documentation

## 2015-03-23 DIAGNOSIS — M25551 Pain in right hip: Secondary | ICD-10-CM | POA: Diagnosis not present

## 2015-03-23 DIAGNOSIS — W010XXA Fall on same level from slipping, tripping and stumbling without subsequent striking against object, initial encounter: Secondary | ICD-10-CM | POA: Diagnosis not present

## 2015-03-23 DIAGNOSIS — S3993XA Unspecified injury of pelvis, initial encounter: Secondary | ICD-10-CM | POA: Diagnosis not present

## 2015-03-23 DIAGNOSIS — Z7982 Long term (current) use of aspirin: Secondary | ICD-10-CM | POA: Diagnosis not present

## 2015-03-23 DIAGNOSIS — M533 Sacrococcygeal disorders, not elsewhere classified: Secondary | ICD-10-CM | POA: Diagnosis not present

## 2015-03-23 DIAGNOSIS — Z880 Allergy status to penicillin: Secondary | ICD-10-CM | POA: Insufficient documentation

## 2015-03-23 DIAGNOSIS — R0781 Pleurodynia: Secondary | ICD-10-CM | POA: Diagnosis not present

## 2015-03-23 NOTE — ED Notes (Addendum)
Pt slipped on baby oil on tile floor and fell this morning around 11am.  C/o pain to back of head, neck, buttocks, and bilateral rib pain.  Denies LOC. Reports nausea after fall but it has resolved.

## 2015-03-24 ENCOUNTER — Emergency Department (HOSPITAL_COMMUNITY): Payer: Medicare Other

## 2015-03-24 DIAGNOSIS — S3993XA Unspecified injury of pelvis, initial encounter: Secondary | ICD-10-CM | POA: Diagnosis not present

## 2015-03-24 DIAGNOSIS — S298XXA Other specified injuries of thorax, initial encounter: Secondary | ICD-10-CM | POA: Diagnosis not present

## 2015-03-24 DIAGNOSIS — R0781 Pleurodynia: Secondary | ICD-10-CM | POA: Diagnosis not present

## 2015-03-24 DIAGNOSIS — M533 Sacrococcygeal disorders, not elsewhere classified: Secondary | ICD-10-CM | POA: Diagnosis not present

## 2015-03-24 DIAGNOSIS — S300XXA Contusion of lower back and pelvis, initial encounter: Secondary | ICD-10-CM | POA: Diagnosis not present

## 2015-03-24 DIAGNOSIS — M25551 Pain in right hip: Secondary | ICD-10-CM | POA: Diagnosis not present

## 2015-03-24 DIAGNOSIS — M25552 Pain in left hip: Secondary | ICD-10-CM | POA: Diagnosis not present

## 2015-03-24 NOTE — Discharge Instructions (Signed)
Chest Contusion A contusion is a deep bruise. Bruises happen when an injury causes bleeding under the skin. Signs of bruising include pain, puffiness (swelling), and discolored skin. The bruise may turn blue, purple, or yellow.  HOME CARE  Put ice on the injured area.  Put ice in a plastic bag.  Place a towel between the skin and the bag.  Leave the ice on for 15-20 minutes at a time, 03-04 times a day for the first 48 hours.  Only take medicine as told by your doctor.  Rest.  Take deep breaths (deep-breathing exercises) as told by your doctor.  Stop smoking if you smoke.  Do not lift objects over 5 pounds (2.3 kilograms) for 3 days or longer if told by your doctor. GET HELP RIGHT AWAY IF:   You have more bruising or puffiness.  You have pain that gets worse.  You have trouble breathing.  You are dizzy, weak, or pass out (faint).  You have blood in your pee (urine) or poop (stool).  You cough up or throw up (vomit) blood.  Your puffiness or pain is not helped with medicines. MAKE SURE YOU:   Understand these instructions.  Will watch your condition.  Will get help right away if you are not doing well or get worse.   This information is not intended to replace advice given to you by your health care provider. Make sure you discuss any questions you have with your health care provider.   Document Released: 10/27/2007 Document Revised: 02/02/2012 Document Reviewed: 11/01/2011 Elsevier Interactive Patient Education 2016 [employer]  Contusion A contusion is a deep bruise. Contusions are the result of a blunt injury to tissues and muscle fibers under the skin. The injury causes bleeding under the skin. The skin overlying the contusion may turn blue, purple, or yellow. Minor injuries will give you a painless contusion, but more severe contusions may stay painful and swollen for a few weeks.  CAUSES  This condition is usually caused by a blow, trauma, or direct force  to an area of the body. SYMPTOMS  Symptoms of this condition include:  Swelling of the injured area.  Pain and tenderness in the injured area.  Discoloration. The area may have redness and then turn blue, purple, or yellow. DIAGNOSIS  This condition is diagnosed based on a physical exam and medical history. An X-ray, CT scan, or MRI may be needed to determine if there are any associated injuries, such as broken bones (fractures). TREATMENT  Specific treatment for this condition depends on what area of the body was injured. In general, the best treatment for a contusion is resting, icing, applying pressure to (compression), and elevating the injured area. This is often called the RICE strategy. Over-the-counter anti-inflammatory medicines may also be recommended for pain control.  HOME CARE INSTRUCTIONS   Rest the injured area.  If directed, apply ice to the injured area:  Put ice in a plastic bag.  Place a towel between your skin and the bag.  Leave the ice on for 20 minutes, 2-3 times per day.  If directed, apply light compression to the injured area using an elastic bandage. Make sure the bandage is not wrapped too tightly. Remove and reapply the bandage as directed by your health care provider.  If possible, raise (elevate) the injured area above the level of your heart while you are sitting or lying down.  Take over-the-counter and prescription medicines only as told by your health care provider. SEEK  MEDICAL CARE IF:  Your symptoms do not improve after several days of treatment.  Your symptoms get worse.  You have difficulty moving the injured area. SEEK IMMEDIATE MEDICAL CARE IF:   You have severe pain.  You have numbness in a hand or foot.  Your hand or foot turns pale or cold.   This information is not intended to replace advice given to you by your health care provider. Make sure you discuss any questions you have with your health care provider.   Document  Released: 02/17/2005 Document Revised: 01/29/2015 Document Reviewed: 09/25/2014 Elsevier Interactive Patient Education [employer]

## 2015-03-24 NOTE — ED Provider Notes (Signed)
CSN: [id number]     Arrival date & time 03/23/15  2017 History   By signing my name below, I, Brittany Crosby, attest that this documentation has been prepared under the direction and in the presence of Brittany Pulley, MD.  Electronically Signed: Arlan Crosby, ED Scribe. 03/24/2015. 12:34 AM.   Chief Complaint  Patient presents with  . Fall   HPI  HPI Comments: Brittany Crosby is a 74 y.o. female with a PMHx of thyroid disease and COPD who presents to the Emergency Department here after a fall sustained at approximately 11:00 AM this morning. Pt states she slipped on baby oil on some tiled flooring resulting in her falling and landing on the back of her head. No LOC. She now c/o constant, ongoing neck pain, pain to her buttocks, and head. Pain is made worse with certain movements. No alleviating factors at this time. No OTC medications or home remedies attempted prior to arrival. No recent fever, chills, nausea, vomiting, or shortness of breath. She is not currently on any anticoagulants.  PCP: Brittany Labella, MD    Past Medical History  Diagnosis Date  . Thyroid disease hypothyroidism  . COPD (chronic obstructive pulmonary disease) (HCC)   . Glaucoma   . Anxiety   . Hiatal hernia   . Disorder of bone and cartilage, unspecified   . Major depressive disorder, single episode, unspecified (HCC)   . Hypercholesteremia   . Nicotine dependence     history  . H/O falling   . Colon polyps   . Glaucoma     unspecified   Past Surgical History  Procedure Laterality Date  . Tonsillectomy    . Appendectomy    . Cholecystectomy    . Abdominal hysterectomy    . Bladder augmentation    . Back surgery     Family History  Problem Relation Age of Onset  . Dementia Mother   . Heart attack Father    Social History  Substance Use Topics  . Smoking status: Former Smoker    Types: Cigarettes    Quit date: 11/22/2011  . Smokeless tobacco: Never Used  . Alcohol Use: No   OB History    No data available     Review of Systems  Constitutional: Negative for fever and chills.  Respiratory: Negative for cough and shortness of breath.   Gastrointestinal: Negative for nausea, vomiting and abdominal pain.  Genitourinary: Negative for dysuria.  Musculoskeletal: Positive for arthralgias and neck pain.  Skin: Negative for rash.  Psychiatric/Behavioral: Negative for confusion.  All other systems reviewed and are negative.     Allergies  Codeine; Sulfa antibiotics; and Penicillins  Home Medications   Prior to Admission medications   Medication Sig Start Date End Date Taking? Authorizing Provider  aspirin EC 81 MG tablet Take 81 mg by mouth daily.    Historical Provider, MD  carbidopa-levodopa (SINEMET IR) 25-100 MG per tablet Take 1/2 pill twice daily x 1 week, then 1/2 pill 3 times a day x 1 week, then 1 pill 3 times a day thereafter. 12/23/14   Huston Foley, MD  citalopram (CELEXA) 40 MG tablet Take 40 mg by mouth daily. 11/08/14   Historical Provider, MD  gabapentin (NEURONTIN) 300 MG capsule Take 1 capsule (300 mg total) by mouth at bedtime. 06/24/14   Huston Foley, MD  gabapentin (NEURONTIN) 300 MG capsule TAKE 1 CAPSULE (300 MG TOTAL) BY MOUTH AT BEDTIME 02/17/15   Huston Foley, MD  latanoprost (XALATAN) 0.005 % ophthalmic  solution Place 1 drop into both eyes at bedtime.    Historical Provider, MD  levothyroxine (SYNTHROID, LEVOTHROID) 88 MCG tablet Take 88 mcg by mouth daily.    Historical Provider, MD  Multiple Vitamins-Minerals (ONE-A-DAY WOMENS 50+ ADVANTAGE PO) Take 1 tablet by mouth daily.    Historical Provider, MD  NON FORMULARY 2lpm at bedtime.    Historical Provider, MD  PROAIR HFA 108 [id number](90 BASE) MCG/ACT inhaler  04/29/14   Historical Provider, MD  Spacer/Aero-Holding Chambers (AEROCHAMBER MV) inhaler Use as instructed 01/01/14   Lupita Leashouglas B McQuaid, MD  SYMBICORT 160-4.5 MCG/ACT inhaler INHALE 1 PUFF INTO THE LUNGS TWICE DAILY 03/17/15   Lupita Leashouglas B McQuaid, MD  SYMBICORT  160-4.5 MCG/ACT inhaler INHALE 1 PUFF INTO THE LUNGS TWICE DAILY 03/18/15   Lupita Leashouglas B McQuaid, MD   Triage Vitals: There were no vitals taken for this visit.   Physical Exam  Constitutional: She is oriented to person, place, and time. She appears well-developed and well-nourished. No distress.  HENT:  Head: Normocephalic and atraumatic.  No hematoma or depression  Eyes: EOM are normal.  Neck: Normal range of motion.  Cardiovascular: Normal rate, regular rhythm and normal heart sounds.   Pulmonary/Chest: Effort normal and breath sounds normal. She exhibits tenderness.  Moderate tenderness to L anterior 7-8 ribs  Abdominal: Soft. She exhibits no distension. There is no tenderness.  Musculoskeletal: Normal range of motion. She exhibits tenderness.  Midline neck tenderness with full ROM Abrasion over mid thoracic spine area Well healed lumbar incision  L lateral hip pain Ecchymosis over sacrum   Neurological: She is alert and oriented to person, place, and time.  Skin: Skin is warm and dry.  Psychiatric: She has a normal mood and affect. Judgment normal.  Nursing note and vitals reviewed.   ED Course  Procedures (including critical care time)  DIAGNOSTIC STUDIES: Oxygen Saturation is 97% on RA, normal by my interpretation.    COORDINATION OF CARE: 12:09 AM- Will order DG sacrum/coccyx, DG pelvis 1-2 views, EKG, and DG ribs unilateral with chest L. Discussed treatment plan with pt at bedside and pt agreed to plan.     Labs Review Labs Reviewed - No data to display  Imaging Review Dg Ribs Unilateral W/chest Left  03/24/2015  CLINICAL DATA:  Larey SeatFell on tile floor. Bilateral hip and lower LEFT rib pain. History of nicotine abuse. EXAM: LEFT RIBS AND CHEST - 3+ VIEW COMPARISON:  CT chest January 14, 2015 FINDINGS: The cardiac silhouette is upper limits of normal in size, mediastinal silhouette is nonsuspicious. Mild chronic interstitial changes, increased lung volumes, most compatible  with COPD. No pleural effusion or focal consolidation. Stable apical pleural thickening. No pneumothorax. No rib fracture deformity. Osteopenia, decreasing sensitivity for acute nondisplaced fractures. Surgical clips in the included right abdomen compatible with cholecystectomy. IMPRESSION: No acute cardiopulmonary process or rib fracture deformity. COPD. Electronically Signed   By: Awilda Metroourtnay  Bloomer M.D.   On: 03/24/2015 02:20   Dg Pelvis 1-2 Views  03/24/2015  CLINICAL DATA:  Pain in both hips and in the coccyx, after falling to the floor. EXAM: PELVIS - 1-2 VIEW; SACRUM AND COCCYX - 2+ VIEW COMPARISON:  09/12/2012 FINDINGS: A single AP view of the pelvis was obtained, as well as three views of the sacrum and coccyx. No acute fracture is evident about the pelvis. The pubic symphysis and sacroiliac joints appear unremarkable. IMPRESSION: Negative for acute fracture. Electronically Signed   By: Ellery Plunkaniel R Mitchell M.D.   On: 03/24/2015 02:21  Dg Sacrum/coccyx  03/24/2015  CLINICAL DATA:  Pain in both hips and in the coccyx, after falling to the floor. EXAM: PELVIS - 1-2 VIEW; SACRUM AND COCCYX - 2+ VIEW COMPARISON:  09/12/2012 FINDINGS: A single AP view of the pelvis was obtained, as well as three views of the sacrum and coccyx. No acute fracture is evident about the pelvis. The pubic symphysis and sacroiliac joints appear unremarkable. IMPRESSION: Negative for acute fracture. Electronically Signed   By: Ellery Plunk M.D.   On: 03/24/2015 02:21   I have personally reviewed and evaluated these images and lab results as part of my medical decision-making.   EKG Interpretation   Date/Time:  Sunday March 23 2015 20:33:54 EDT Ventricular Rate:  77 PR Interval:  150 QRS Duration: 80 QT Interval:  366 QTC Calculation: 414 R Axis:   26 Text Interpretation:  Normal sinus rhythm Septal infarct , age  undetermined Abnormal ECG No significant change since last tracing  Confirmed by Mia Milan MD, Reuel Boom  ([id number]) on 03/24/2015 2:56:32 AM      MDM   Final diagnoses:  Fall from standing, initial encounter  Sacral contusion, initial encounter  Chest wall contusion, left, initial encounter   74 year old female presents with mechanical fall this morning. Has been ambulatory since incident. Having some left worse than right sided rib pain and has bruising over her sacrum concerning for possible bony injury. Plain films are without any fractures, patient's pain is not affecting her breathing currently. Plan to follow up with PCP as needed and return precautions discussed for worsening or new concerning symptoms.   I personally performed the services described in this documentation, which was scribed in my presence. The recorded information has been reviewed and is accurate.    Brittany Pulley, MD 03/24/15 [phone]

## 2015-03-24 NOTE — ED Notes (Signed)
Helped pt on bedpan.

## 2015-03-24 NOTE — ED Notes (Signed)
Pt returned from xray

## 2015-04-07 DIAGNOSIS — H2511 Age-related nuclear cataract, right eye: Secondary | ICD-10-CM | POA: Diagnosis not present

## 2015-04-10 DIAGNOSIS — G20 Parkinson's disease: Secondary | ICD-10-CM | POA: Diagnosis not present

## 2015-04-10 DIAGNOSIS — J449 Chronic obstructive pulmonary disease, unspecified: Secondary | ICD-10-CM | POA: Diagnosis not present

## 2015-04-10 DIAGNOSIS — E039 Hypothyroidism, unspecified: Secondary | ICD-10-CM | POA: Diagnosis not present

## 2015-04-10 DIAGNOSIS — E7800 Pure hypercholesterolemia, unspecified: Secondary | ICD-10-CM | POA: Diagnosis not present

## 2015-04-10 DIAGNOSIS — Z9181 History of falling: Secondary | ICD-10-CM | POA: Diagnosis not present

## 2015-04-28 ENCOUNTER — Encounter: Payer: Self-pay | Admitting: Neurology

## 2015-04-28 ENCOUNTER — Ambulatory Visit (INDEPENDENT_AMBULATORY_CARE_PROVIDER_SITE_OTHER): Payer: Medicare Other | Admitting: Neurology

## 2015-04-28 VITALS — BP 138/64 | HR 78 | Resp 16 | Ht 62.0 in | Wt 147.0 lb

## 2015-04-28 DIAGNOSIS — G20 Parkinson's disease: Secondary | ICD-10-CM | POA: Diagnosis not present

## 2015-04-28 NOTE — Progress Notes (Signed)
Subjective:    Patient ID: Brittany Crosby is a 74 y.o. female.  HPI     Interim history:   Brittany Crosby is a 74 year old right-handed woman with an underlying medical history of lower back pain with history of herniated disc, hyperlipidemia, glaucoma, cataracts, hiatal hernia, depression, anxiety, hypertension, osteopenia, and COPD in the context of prior smoking (quit some 3 years ago), status post tonsillectomy, appendectomy, cholecystectomy, bladder surgery and abdominal hysterectomy, who presents for followup consultation of her right hand tremor, and evidence of parkinsonism. The patient is unaccompanied today. I last saw her on 12/23/2014, at which time she reported more difficulty with her right side, more insecurity with walking, more stiffness and more difficulty with fine motor skills. She was using a cane. Thankfully she had not fallen. She had noted more difficulty with dressing especially with tying her shoes. I felt that she had more signs and symptoms of parkinsonism, and I suggested trial of Sinemet, 25-100 milligrams strength, 1/2 pill twice daily x 1 week, then 1/2 pill 3 times a day x 1 week, then 1 pill 3 times a day thereafter.  Today, 04/28/2015: She reports a recent fall d/t slipping on tile on baby oil. She went to the emergency room on 03/24/2015. She was ambulatory. She had some rib pain. She had x-rays of sacrum/coccyx, pelvis and ribs. X-rays were negative for any acute injuries. I reviewed the emergency room records and x-ray results. She was diagnosed with sacral contusion and chest wall contusion and was discharged to home. She had some bruising but healed without any obvious sequelae. As far as the Sinemet goes, she feels it has helped. She feels that her dexterity in mobility of better and her tremor is less. She still has some residual lower extremity tremor on the right.  She has had some weight loss. She has been on Boost once daily. She will have a FU in 2/17.  She has noted hair loss in the last couple of months, and wonders if it is related to the C/L. Mood at times a little depressed, on celexa. She has stable mild short term memory issues.   Previously:  I saw her on 06/24/2014, at which time she reported feeling about the same with her tremors. She felt that the gabapentin had helped some. She had no recent falls. I felt her exam was fairly stable and I asked her to continue with low dose gabapentin.  I saw her on 01/03/2014, at which time I did note a lateralized tremor to the right but no telltale signs of parkinsonism and I asked her to continue with gabapentin. She reported being on oxygen at night and she had started a new inhaler for her COPD which helped her breathing.   I first met her on 08/21/2013 at the request of her primary care physician at which time she reported a one-year history of gradual onset and progressive R hand tremor, affecting her handwriting (which has become smaller) and her feeding skills. Some months later, she noted a R foot tremor. At the time of her first visit I felt that there was a chance she had no telltale signs of Parkinson's disease or parkinsonism but this could not be completely excluded. I suggested a brain MRI with and without contrast. She had a brain MRI with and without contrast on 09/17/2013: Mild periventricular and subcortical non-specific gliosis. These findings are non-specific and considerations include autoimmune, inflammatory, post-infectious, microvascular ischemic or migraine associated etiologies. 2. No acute findings. In  addition, personally reviewed the images through the PACS system. We called her with the test results. She called in 4/15 for ongoing tremors and had already reduced her caffeine intake as recommended and I called in low dose gabapentin with titration and she is now on 300 mg qHS. She thinks her tremor is a little better and she has residual LE tremor on the R and mild morning  grogginess.    She reported difficulty getting out of a chair and reported balance problems. She reported a total of 4 falls in 12 months. No LOC, no head injury was reported. She reported no FHx of PD, but MGM had MS and daughter has dystonia. Her mother had a head tremor. She denied exposure to pesticides or other harsh chemicals. She worked in [employer]. She does not drink alcohol. She does drink sweet tea and 3 cans of cola per day. She has been on Celexa for the past 2 years. She has no history of neuroleptic medication use or Reglan. She had blood work on 08/16/13: CBC with diff, TSH, ESR and B12.  Her Past Medical History Is Significant For: Past Medical History  Diagnosis Date  . Thyroid disease hypothyroidism  . COPD (chronic obstructive pulmonary disease) ([place])   . Glaucoma   . Anxiety   . Hiatal hernia   . Disorder of bone and cartilage, unspecified   . Major depressive disorder, single episode, unspecified ([place])   . Hypercholesteremia   . Nicotine dependence     history  . H/O falling   . Colon polyps   . Glaucoma     unspecified    Her Past Surgical History Is Significant For: Past Surgical History  Procedure Laterality Date  . Tonsillectomy    . Appendectomy    . Cholecystectomy    . Abdominal hysterectomy    . Bladder augmentation    . Back surgery      Her Family History Is Significant For: Family History  Problem Relation Age of Onset  . Dementia Mother   . Heart attack Father     Her Social History Is Significant For: Social History   Social History  . Marital Status: Single    Spouse Name: [name]  . Number of Children: 4  . Years of Education: college   Occupational History  . retired    Social History Main Topics  . Smoking status: Former Smoker    Types: Cigarettes    Quit date: 11/22/2011  . Smokeless tobacco: Never Used  . Alcohol Use: No  . Drug Use: No  . Sexual Activity: Yes    Birth Control/ Protection: Surgical   Other Topics  Concern  . None   Social History Narrative   Patient is right handed, resides with signficant other in a home    Her Allergies Are:  Allergies  Allergen Reactions  . Codeine Other (See Comments)    Abdominal pain  . Sulfa Antibiotics Swelling  . Penicillins Rash  :   Her Current Medications Are:  Outpatient Encounter Prescriptions as of 04/28/2015  Medication Sig  . aspirin EC 81 MG tablet Take 81 mg by mouth daily.  . carbidopa-levodopa (SINEMET IR) 25-100 MG per tablet Take 1/2 pill twice daily x 1 week, then 1/2 pill 3 times a day x 1 week, then 1 pill 3 times a day thereafter.  . citalopram (CELEXA) 40 MG tablet Take 40 mg by mouth daily.  Marland Kitchen gabapentin (NEURONTIN) 300 MG capsule TAKE 1 CAPSULE (300  MG TOTAL) BY MOUTH AT BEDTIME  . latanoprost (XALATAN) 0.005 % ophthalmic solution Place 1 drop into both eyes at bedtime.  Marland Kitchen levothyroxine (SYNTHROID, LEVOTHROID) 88 MCG tablet Take 88 mcg by mouth daily.  . Multiple Vitamins-Minerals (ONE-A-DAY WOMENS 50+ ADVANTAGE PO) Take 1 tablet by mouth daily.  . NON FORMULARY 2lpm at bedtime.  Marland Kitchen PROAIR HFA 108 (90 BASE) MCG/ACT inhaler   . Spacer/Aero-Holding Chambers (AEROCHAMBER MV) inhaler Use as instructed  . SYMBICORT 160-4.5 MCG/ACT inhaler INHALE 1 PUFF INTO THE LUNGS TWICE DAILY  . [DISCONTINUED] gabapentin (NEURONTIN) 300 MG capsule Take 1 capsule (300 mg total) by mouth at bedtime.  . [DISCONTINUED] SYMBICORT 160-4.5 MCG/ACT inhaler INHALE 1 PUFF INTO THE LUNGS TWICE DAILY   No facility-administered encounter medications on file as of 04/28/2015.  :  Review of Systems:  Out of a complete 14 point review of systems, all are reviewed and negative with the exception of these symptoms as listed below:   Review of Systems  Constitutional:       Patient reports recent hair loss.   Neurological:       Patient had a recent fall and was treated in ED.     Objective:  Neurologic Exam  Physical Exam Physical Examination:   Filed  Vitals:   04/28/15 1300  BP: 138/64  Pulse: 78  Resp: 16    General Examination: The patient is a very pleasant 74 y.o. female in no acute distress.  HEENT: Normocephalic, atraumatic, pupils are equal, round and reactive to light and accommodation. Funduscopic exam is normal with sharp disc margins noted. She has had bilateral cataract repairs, eyes look a little dry. Extraocular tracking shows mild saccadic breakdown without nystagmus noted. There is no limitation to her gaze. There is no decrease in eye blink rate. Hearing is intact. Face is symmetric with slight facial masking and normal facial sensation. There is no lip, neck or jaw tremor. Neck is mildly with intact passive ROM. There are no carotid bruits on auscultation. Oropharynx exam reveals moderate mouth dryness. Moderate airway crowding is noted, due to narrow airway and thick soft palate. She has dentures in the upper and is nearly edentulous in the lower. She  Mallampati is class II. Tongue protrudes centrally and palate elevates symmetrically. There is no drooling.   Chest: is clear to auscultation without wheezing, rhonchi or crackles noted.  Heart: sounds are regular and normal without murmurs, rubs or gallops noted.   Abdomen: is soft, non-tender and non-distended with normal bowel sounds appreciated on auscultation.  Extremities: There is trace to trace pitting edema in the distal lower extremities bilaterally. Pedal pulses are intact. There are no varicose veins.  Skin: is warm and dry with no trophic changes noted. Age-related changes are noted on the skin.   Musculoskeletal: exam reveals no obvious joint deformities, tenderness, joint swelling or erythema.  Neurologically:   Mental status: The patient is awake and alert, paying good  attention. She is able to completely provide the history. She is oriented to: person, place, time/date, situation, day of week, month of year and year. Her memory, attention, language and  knowledge are intact. There is no aphasia, agnosia, apraxia or anomia. There is a no evidence of bradyphrenia. Speech is mildly hypophonic with no dysarthria noted. Mood is congruent and affect is normal.   Cranial nerves are as described above under HEENT exam. In addition, shoulder shrug is normal with equal shoulder height noted.  Motor exam: Normal bulk, and strength  for age is noted. There are no dyskinesias noted.  Tone is  increased in the right upper and lower extremities with mild cogwheeling noted. There is mild bradykinesia. There is no drift or rebound.  There is an intermittent resting tremor on the right lower extremity and no resting tremor in the right upper extremity. She has no resting tremor on the left. Romberg is negative, but shows some swaying, unchanged.  Reflexes are 2+ in the upper extremities and 2+ in the lower extremities.  Fine motor skills exam: Finger taps, rapid alternating tapping and hand movements are all mild to moderately impaired on the right and minimally to not impaired on the left. The same is true for lower body foot agility and foot tapping. These are progressive changes from last time.  Cerebellar testing shows no dysmetria or intention tremor on finger to nose testing. Heel to shin is unremarkable bilaterally, but slow. There is no truncal or gait ataxia.   Sensory exam is intact to light touch in the upper and lower extremities.   Gait, station and balance: She stands up from the seated position with mild difficulty and does need to push up with Her hands. She needs no assistance, and takes 1 attempt. No veering to one side is noted. She is not noted to lean to the side. Posture is mild to moderately stooped, better than last time. Stance is narrow-based. She walks with decrease in stride length and pace and decreased arm swing on the right, unchanged. She turns in 3 steps. Tandem walk is not possible. Balance is mildly impaired.     Assessment and  Plan:   In summary, Janin Kozlowski is a very pleasant 74 year old female with an underlying medical history of lower back pain with history of herniated disc, hyperlipidemia, glaucoma, cataracts, hiatal hernia, depression, anxiety, hypertension, osteopenia, emphysema and COPD in the context of prior smoking (quit some 3 years ago), status post tonsillectomy, appendectomy, cholecystectomy, bladder surgery and abdominal hysterectomy, who reports an approximately 2 to 3 year history of right-sided tremors, particularly in the right foot. She has had more recent  progression in her symptoms and that she has noticed stiffness on the right side, more fine motor difficulties, and more difficulty with her gait. Her history and exam are in keeping with right-sided parkinsonism, most likely right-sided predominant Parkinson's disease. I started her on Sinemet low dose at her last visit and she has been able to tolerate this. She has had some benefit from it and currently is on 1 pill 3 times a day, 25-100 milligrams strength. Her exam has improved a little bit as well. She has had some weight loss and hair loss lately. She has blood work scheduled for a couple months from now through her primary care physician. She has been supplementing her diet with boost protein drinks. I explained to her that Sinemet does not typically cause hair loss. We will look into this further. At this juncture, I suggested she continue with it at the current dose. She did not require a refill today. I suggested a 4 month follow-up, sooner if needed. She is encouraged to call for any interim questions or concerns. She is advised to drink enough water every day and use her cane for safety. She is particularly advised to be cautious with turns.  I answered all her questions today and she was in agreement.  I spent 20 minutes in total face-to-face time with the patient, more than 50% of which was spent  in counseling and coordination of care,  reviewing test results, reviewing medication and discussing or reviewing the diagnosis of PD, its prognosis and treatment options.

## 2015-04-28 NOTE — Patient Instructions (Addendum)
We will continue with Sinemet 25/100 mg 1 pill three times a day. 8 AM, 12 and 4 PM.   Please be careful when walking on tile, especially with turns. Use your cane at all times.   For dry skin, try Eucerin Brand calming skin lotion.

## 2015-06-30 DIAGNOSIS — J449 Chronic obstructive pulmonary disease, unspecified: Secondary | ICD-10-CM | POA: Diagnosis not present

## 2015-06-30 DIAGNOSIS — B349 Viral infection, unspecified: Secondary | ICD-10-CM | POA: Diagnosis not present

## 2015-07-07 DIAGNOSIS — H401131 Primary open-angle glaucoma, bilateral, mild stage: Secondary | ICD-10-CM | POA: Diagnosis not present

## 2015-07-16 ENCOUNTER — Telehealth: Payer: Self-pay

## 2015-07-16 MED ORDER — GABAPENTIN 300 MG PO CAPS: 300.0000 mg | ORAL_CAPSULE | Freq: Every day | ORAL | Status: DC

## 2015-07-16 NOTE — Telephone Encounter (Signed)
90 day supply of Gabapentin requested by pharmacy.

## 2015-07-19 ENCOUNTER — Other Ambulatory Visit: Payer: Self-pay | Admitting: Neurology

## 2015-08-15 DIAGNOSIS — R509 Fever, unspecified: Secondary | ICD-10-CM | POA: Diagnosis not present

## 2015-08-15 DIAGNOSIS — J019 Acute sinusitis, unspecified: Secondary | ICD-10-CM | POA: Diagnosis not present

## 2015-08-15 DIAGNOSIS — J209 Acute bronchitis, unspecified: Secondary | ICD-10-CM | POA: Diagnosis not present

## 2015-08-27 ENCOUNTER — Ambulatory Visit (INDEPENDENT_AMBULATORY_CARE_PROVIDER_SITE_OTHER): Payer: Medicare Other | Admitting: Neurology

## 2015-08-27 ENCOUNTER — Encounter: Payer: Self-pay | Admitting: Neurology

## 2015-08-27 VITALS — BP 136/62 | HR 76 | Resp 16 | Ht 62.0 in | Wt 150.0 lb

## 2015-08-27 DIAGNOSIS — R600 Localized edema: Secondary | ICD-10-CM | POA: Diagnosis not present

## 2015-08-27 DIAGNOSIS — G20 Parkinson's disease: Secondary | ICD-10-CM

## 2015-08-27 MED ORDER — CARBIDOPA-LEVODOPA 25-100 MG PO TABS: 1.0000 | ORAL_TABLET | Freq: Three times a day (TID) | ORAL | Status: DC

## 2015-08-27 NOTE — Patient Instructions (Signed)
I think your Parkinson's disease has remained fairly stable, which is reassuring. Nevertheless, as you know, this disease does progress with time. It can affect your balance, your memory, your mood, your bowel and bladder function, your posture, balance and walking and your activities of daily living. However, there are good supportive treatments and symptomatic treatments available, so most patients have a change to a good quality life and life expectancy is not typically altered. Overall you are doing fairly well but I do want to suggest a few things today:  Remember to drink plenty of fluid at least 6 glasses (8 oz each), eat healthy meals and do not skip any meals. Try to eat protein with a every meal and eat a healthy snack such as fruit or nuts in between meals. Try to keep a regular sleep-wake schedule and try to exercise daily, particularly in the form of walking, 20-30 minutes a day, if you can.   Taking your medication on schedule is key.   Try to stay active physically and mentally. Engage in social activities in your community and with your family and try to keep up with current events by reading the newspaper or watching the news. Try to do word puzzles and you may like to do puzzles and brain games on the computer such as on [email or web address].   As far as your medications are concerned, I would like to suggest that you take your current medication with the following additional changes: no change today, keep sinemet at 1 pill 3 times a day.     I would like to see you back in 4 months, sooner if we need to. Please call us with any interim questions, concerns, problems, updates or refill requests.  Our phone number is [phone]. We also have an after hours call service for urgent matters and there is a physician on-call for urgent questions, that cannot wait till the next work day. For any emergencies you know to call 911 or go to the nearest emergency room.   You can email me through my  chart and also leave a phone message for Lafonda Mossesiana, my nurse.

## 2015-08-27 NOTE — Progress Notes (Signed)
Subjective:    Patient ID: Brittany Crosby is a 75 y.o. female.  HPI     Interim history:   Brittany Crosby is a 75 year old right-handed woman with an underlying medical history of lower back pain with history of herniated disc, hyperlipidemia, glaucoma, cataracts, hiatal hernia, depression, anxiety, hypertension, osteopenia, and COPD in the context of prior smoking (quit some 3 years ago), status post tonsillectomy, appendectomy, cholecystectomy, bladder surgery and abdominal hysterectomy, who presents for followup consultation of her right hand tremor, and evidence of parkinsonism. The patient is unaccompanied today. I last saw her on 04/28/2015, at which time she reported a recent fall secondary to slipping on tile where she had spilled baby oil. She did go to the emergency room on 03/24/2015. She was noted to be ambulatory. She reported some rib pain. She had x-rays of sacrum, coccyx, pelvis and ribs. Thankfully she had no acute injuries. I reviewed the emergency room records and x-ray results at the time. She was diagnosed with sacral contusion and chest wall contusion and was discharged to home. She had some bruising and residual pain but then improved with time. She had no sequelae. She felt that the Sinemet was helpful and that her dexterity, mobility and tremors were better. She reported some weight loss. She was using boost once a day. Her mood was at times a little depressed. She was on Celexa. She had short-term memory issues which she felt were stable. I suggested she continue Sinemet 1 pill 3 times a day. She was advised to use her cane at all times and turns slowly.  Today, 08/27/2015: She reports doing well. No hallucinations. No falls, memory and mood stable. No significant constipation. She lives with Brittany Crosby (BF of 20 years), her first husband is sick, she has 2 daughters from her 1st marriage. She had 2 sons with her second husband. Brittany Crosby has 3 children from before. She likes to drink  tea, not much water. Tries to walk every day son. She uses a cane.no recent hallucinations. When she first started taking Sinemet she had brief hallucinations at night. These resolved. Sometimes after taking the Sinemet she feels a little lightheaded. This is why she did not take it after this morning today because she had to drive and did not want to feel dizzy or lightheaded.  Previously:  I saw her on 12/23/2014, at which time she reported more difficulty with her right side, more insecurity with walking, more stiffness and more difficulty with fine motor skills. She was using a cane. Thankfully she had not fallen. She had noted more difficulty with dressing especially with tying her shoes. I felt that she had more signs and symptoms of parkinsonism, and I suggested trial of Sinemet, 25-100 milligrams strength, 1/2 pill twice daily x 1 week, then 1/2 pill 3 times a day x 1 week, then 1 pill 3 times a day thereafter.  I saw her on 06/24/2014, at which time she reported feeling about the same with her tremors. She felt that the gabapentin had helped some. She had no recent falls. I felt her exam was fairly stable and I asked her to continue with low dose gabapentin.  I saw her on 01/03/2014, at which time I did note a lateralized tremor to the right but no telltale signs of parkinsonism and I asked her to continue with gabapentin. She reported being on oxygen at night and she had started a new inhaler for her COPD which helped her breathing.   I first  met her on 08/21/2013 at the request of her primary care physician at which time she reported a one-year history of gradual onset and progressive R hand tremor, affecting her handwriting (which has become smaller) and her feeding skills. Some months later, she noted a R foot tremor. At the time of her first visit I felt that there was a chance she had no telltale signs of Parkinson's disease or parkinsonism but this could not be completely excluded. I  suggested a brain MRI with and without contrast. She had a brain MRI with and without contrast on 09/17/2013: Mild periventricular and subcortical non-specific gliosis. These findings are non-specific and considerations include autoimmune, inflammatory, post-infectious, microvascular ischemic or migraine associated etiologies. 2. No acute findings. In addition, personally reviewed the images through the PACS system. We called her with the test results. She called in 4/15 for ongoing tremors and had already reduced her caffeine intake as recommended and I called in low dose gabapentin with titration and she is now on 300 mg qHS. She thinks her tremor is a little better and she has residual LE tremor on the R and mild morning grogginess.    She reported difficulty getting out of a chair and reported balance problems. She reported a total of 4 falls in 12 months. No LOC, no head injury was reported. She reported no FHx of PD, but MGM had MS and daughter has dystonia. Her mother had a head tremor. She denied exposure to pesticides or other harsh chemicals. She worked in [employer]. She does not drink alcohol. She does drink sweet tea and 3 cans of cola per day. She has been on Celexa for the past 2 years. She has no history of neuroleptic medication use or Reglan. She had blood work on 08/16/13: CBC with diff, TSH, ESR and B12.  Her Past Medical History Is Significant For: Past Medical History  Diagnosis Date  . Thyroid disease hypothyroidism  . COPD (chronic obstructive pulmonary disease) ([place])   . Glaucoma   . Anxiety   . Hiatal hernia   . Disorder of bone and cartilage, unspecified   . Major depressive disorder, single episode, unspecified ([place])   . Hypercholesteremia   . Nicotine dependence     history  . H/O falling   . Colon polyps   . Glaucoma     unspecified    Her Past Surgical History Is Significant For: Past Surgical History  Procedure Laterality Date  . Tonsillectomy    .  Appendectomy    . Cholecystectomy    . Abdominal hysterectomy    . Bladder augmentation    . Back surgery      Her Family History Is Significant For: Family History  Problem Relation Age of Onset  . Dementia Mother   . Heart attack Father     Her Social History Is Significant For: Social History   Social History  . Marital Status: Single    Spouse Name: Brittany Crosby  . Number of Children: 4  . Years of Education: college   Occupational History  . retired    Social History Main Topics  . Smoking status: Former Smoker    Types: Cigarettes    Quit date: 11/22/2011  . Smokeless tobacco: Never Used  . Alcohol Use: No  . Drug Use: No  . Sexual Activity: Yes    Birth Control/ Protection: Surgical   Other Topics Concern  . None   Social History Narrative   Patient is right handed, resides with  signficant other in a home    Her Allergies Are:  Allergies  Allergen Reactions  . Codeine Other (See Comments)    Abdominal pain  . Sulfa Antibiotics Swelling  . Penicillins Rash  :   Her Current Medications Are:  Outpatient Encounter Prescriptions as of 08/27/2015  Medication Sig  . aspirin EC 81 MG tablet Take 81 mg by mouth daily.  . carbidopa-levodopa (SINEMET IR) 25-100 MG tablet Take 1 tablet by mouth 3 (three) times daily.  . citalopram (CELEXA) 40 MG tablet Take 40 mg by mouth daily.  Marland Kitchen gabapentin (NEURONTIN) 300 MG capsule Take 1 capsule (300 mg total) by mouth at bedtime.  Marland Kitchen latanoprost (XALATAN) 0.005 % ophthalmic solution Place 1 drop into both eyes at bedtime.  Marland Kitchen levothyroxine (SYNTHROID, LEVOTHROID) 88 MCG tablet Take 88 mcg by mouth daily.  . Multiple Vitamins-Minerals (ONE-A-DAY WOMENS 50+ ADVANTAGE PO) Take 1 tablet by mouth daily.  . NON FORMULARY 2lpm at bedtime.  Marland Kitchen PROAIR HFA 108 (90 BASE) MCG/ACT inhaler   . Spacer/Aero-Holding Chambers (AEROCHAMBER MV) inhaler Use as instructed  . SYMBICORT 160-4.5 MCG/ACT inhaler INHALE 1 PUFF INTO THE LUNGS TWICE DAILY    No facility-administered encounter medications on file as of 08/27/2015.  :  Review of Systems:  Out of a complete 14 point review of systems, all are reviewed and negative with the exception of these symptoms as listed below:   Review of Systems  Neurological:       Patient is here for f/u. No new concerns per patient. She feels like the Sinemet works well for her.     Objective:  Neurologic Exam  Physical Exam Physical Examination:   Filed Vitals:   08/27/15 1413  BP: 136/62  Pulse: 76  Resp: 16   General Examination: The patient is a very pleasant 75 y.o. female in no acute distress.  HEENT: Normocephalic, atraumatic, pupils are equal, round and reactive to light and accommodation. She has had bilateral cataract repairs, eyes look a little dry. Extraocular tracking shows mild saccadic breakdown without nystagmus noted. There is no limitation to her gaze. There is no decrease in eye blink rate. Hearing is intact. Face is symmetric with slight facial masking and normal facial sensation. There is no lip, neck or jaw tremor. Neck is mildly with intact passive ROM. There are no carotid bruits on auscultation. Oropharynx exam reveals mild mouth dryness. Moderate airway crowding is noted, due to narrow airway and thick soft palate. She has dentures in the upper and is nearly edentulous in the lower. She  Mallampati is class II. Tongue protrudes centrally and palate elevates symmetrically. There is no drooling.   Chest: is clear to auscultation without wheezing, rhonchi or crackles noted.  Heart: sounds are regular and normal without murmurs, rubs or gallops noted.   Abdomen: is soft, non-tender and non-distended with normal bowel sounds appreciated on auscultation.  Extremities: There is trace to trace pitting edema in the distal lower extremities bilaterally, around the ankles only. Pedal pulses are intact. There are no varicose veins.  Skin: is warm and dry with no trophic changes  noted. Age-related changes are noted on the skin.   Musculoskeletal: exam reveals no obvious joint deformities, tenderness, joint swelling or erythema.  Neurologically:   Mental status: The patient is awake and alert, paying good  attention. She is able to completely provide the history. She is oriented to: person, place, time/date, situation, day of week, month of year and year. Her memory, attention,  language and knowledge are intact. There is no aphasia, agnosia, apraxia or anomia. There is a no evidence of bradyphrenia. Speech is mildly hypophonic with no dysarthria noted. Mood is congruent and affect is normal.   Cranial nerves are as described above under HEENT exam. In addition, shoulder shrug is normal with equal shoulder height noted.  Motor exam: Normal bulk, and strength for age is noted. There are no dyskinesias noted.  Tone is  increased in the right upper and lower extremities with mild cogwheeling noted. There is mild bradykinesia. There is no drift or rebound.  There is a slight intermittent resting tremor on the right lower extremity and no resting tremor in the right upper extremity. She has no resting tremor on the left. Romberg is negative, but shows some swaying, unchanged.  Reflexes are 2+ in the upper extremities and 2+ in the lower extremities.  Fine motor skills exam: Finger taps, rapid alternating tapping and hand movements are all mild to moderately impaired on the right and minimally to not impaired on the left. The same is true for lower body foot agility and foot tapping. These are progressive changes from last time.  Cerebellar testing shows no dysmetria or intention tremor on finger to nose testing. Heel to shin is unremarkable bilaterally, but slow. There is no truncal or gait ataxia.   Sensory exam is intact to light touch in the upper and lower extremities.   Gait, station and balance: She stands up from the seated position with mild difficulty and does need to  push up with Her hands. She needs no assistance, and takes 1 attempt. No veering to one side is noted. She is not noted to lean to the side. Posture is mild to moderately stooped, stable. Stance is narrow-based. She walks with decrease in stride length and pace and decreased arm swing on the right, unchanged. She turns in 3 steps. Tandem walk is not possible. Balance is mildly impaired.     Assessment and Plan:   In summary, Brittany Crosby is a very pleasant 75 year old female with an underlying medical history of lower back pain with history of herniated disc, hyperlipidemia, glaucoma, cataracts, hiatal hernia, depression, anxiety, hypertension, osteopenia, emphysema and COPD in the context of prior smoking (quit 3 years ago), status post tonsillectomy, appendectomy, cholecystectomy, bladder surgery and abdominal hysterectomy, who presents for follow-up consultation of her parkinsonism, probably right-sided predominant Parkinson's disease, symptoms started about 3 years ago with right-sided tremors, handwriting difficulties, fine motor difficulties and gait changes as well as stiffness. She has been on Sinemet 1 pill 3 times a day, tolerating this well. She feels that it has helped. She reports no sinister side effects. Her exam is stable.Weight has been stable. I refilled her medication for Sinemet for 90 days supply. We mutually agreed to have her continue with the current dose. She is advised to stay well hydrated with water and use her cane for safety. She is advised to stay active physically as well and pursue a healthy, nutritious diet. Constipation is not currently an issue. Mood and memory are stable.I would like to see her back in 6 months, sooner if the need arises. I answered all her questions today and she was in agreement. I spent 20 minutes in total face-to-face time with the patient, more than 50% of which was spent in counseling and coordination of care, reviewing test results, reviewing  medication and discussing or reviewing the diagnosis of PD, its prognosis and treatment options.

## 2015-10-06 DIAGNOSIS — H401131 Primary open-angle glaucoma, bilateral, mild stage: Secondary | ICD-10-CM | POA: Diagnosis not present

## 2015-10-08 DIAGNOSIS — M859 Disorder of bone density and structure, unspecified: Secondary | ICD-10-CM | POA: Diagnosis not present

## 2015-10-08 DIAGNOSIS — Z79899 Other long term (current) drug therapy: Secondary | ICD-10-CM | POA: Diagnosis not present

## 2015-10-08 DIAGNOSIS — E039 Hypothyroidism, unspecified: Secondary | ICD-10-CM | POA: Diagnosis not present

## 2015-10-08 DIAGNOSIS — G20 Parkinson's disease: Secondary | ICD-10-CM | POA: Diagnosis not present

## 2015-10-08 DIAGNOSIS — J449 Chronic obstructive pulmonary disease, unspecified: Secondary | ICD-10-CM | POA: Diagnosis not present

## 2015-10-08 DIAGNOSIS — H409 Unspecified glaucoma: Secondary | ICD-10-CM | POA: Diagnosis not present

## 2015-10-10 ENCOUNTER — Encounter: Payer: Self-pay | Admitting: Pulmonary Disease

## 2015-10-10 ENCOUNTER — Ambulatory Visit (INDEPENDENT_AMBULATORY_CARE_PROVIDER_SITE_OTHER): Payer: Medicare Other | Admitting: Pulmonary Disease

## 2015-10-10 VITALS — BP 138/82 | HR 85 | Ht 63.0 in | Wt 151.6 lb

## 2015-10-10 DIAGNOSIS — J449 Chronic obstructive pulmonary disease, unspecified: Secondary | ICD-10-CM | POA: Diagnosis not present

## 2015-10-10 DIAGNOSIS — Z720 Tobacco use: Secondary | ICD-10-CM | POA: Diagnosis not present

## 2015-10-10 NOTE — Progress Notes (Signed)
History of Present Illness Brittany LeversCarol Crosby is a 75 y.o. female with GOLD Grade C COPD with severe airflow obstruction with Glaucoma. Maintenance medication is Symbicort. She is followed by Dr. Kendrick FriesMcQuaid  10/10/2015 Annual Follow Up for COPD:  Patient presents to the office for annual follow up. She is compliant with her Symbicort. She states she is doing well. She has had 2 episodes of bronchitis with exacerbation. Flu swabs were negative.  She was treated with antibiotic x 2, and prednisone with the second episode.She has not had to use her rescue inhaler. She states that her shortness of breath is a bit worse in the warm weather.She is up to date with vaccines She states she walks every day, and us trying to remain active.Her Glaucoma is stable, and her intra-occular pressures are stable. She is due for eye follow exam in a few months. She goes for eye exams every 3 months.No current complaints or needs.    Past medical hx Past Medical History  Diagnosis Date  . Thyroid disease hypothyroidism  . COPD (chronic obstructive pulmonary disease) ([facility])   . Glaucoma   . Anxiety   . Hiatal hernia   . Disorder of bone and cartilage, unspecified   . Major depressive disorder, single episode, unspecified ([facility])   . Hypercholesteremia   . Nicotine dependence     history  . H/O falling   . Colon polyps   . Glaucoma     unspecified     Past surgical hx, Family hx, Social hx all reviewed.  Current Outpatient Prescriptions on File Prior to Visit  Medication Sig  . aspirin EC 81 MG tablet Take 81 mg by mouth daily.  . carbidopa-levodopa (SINEMET IR) 25-100 MG tablet Take 1 tablet by mouth 3 (three) times daily.  . citalopram (CELEXA) 40 MG tablet Take 40 mg by mouth daily.  Marland Kitchen. gabapentin (NEURONTIN) 300 MG capsule Take 1 capsule (300 mg total) by mouth at bedtime.  Marland Kitchen. latanoprost (XALATAN) 0.005 % ophthalmic solution Place 1 drop into both eyes at bedtime.  Marland Kitchen. levothyroxine (SYNTHROID,  LEVOTHROID) 88 MCG tablet Take 88 mcg by mouth daily.  . Multiple Vitamins-Minerals (ONE-A-DAY WOMENS 50+ ADVANTAGE PO) Take 1 tablet by mouth daily.  . NON FORMULARY 2lpm at bedtime.  Marland Kitchen. PROAIR HFA 108 (90 BASE) MCG/ACT inhaler   . Spacer/Aero-Holding Chambers (AEROCHAMBER MV) inhaler Use as instructed  . SYMBICORT 160-4.5 MCG/ACT inhaler INHALE 1 PUFF INTO THE LUNGS TWICE DAILY   No current facility-administered medications on file prior to visit.     Allergies  Allergen Reactions  . Codeine Other (See Comments)    Abdominal pain  . Sulfa Antibiotics Swelling  . Penicillins Rash    Review Of Systems:  Constitutional:   No  weight loss, night sweats,  Fevers, chills, fatigue, or  lassitude.  HEENT:   No headaches,  Difficulty swallowing,  Tooth/dental problems, or  Sore throat,                No sneezing, itching, ear ache, nasal congestion, post nasal drip,   CV:  No chest pain,  Orthopnea, PND, swelling in lower extremities, anasarca, dizziness, palpitations, syncope.   GI  No heartburn, indigestion, abdominal pain, nausea, vomiting, diarrhea, change in bowel habits, loss of appetite, bloody stools.   Resp: + shortness of breath with exertion in warm temperatures not  at rest.  No excess mucus, no productive cough,  No non-productive cough,  No coughing up of blood.  No  change in color of mucus.  No wheezing.  No chest wall deformity  Skin: no rash or lesions.  GU: no dysuria, change in color of urine, no urgency or frequency.  No flank pain, no hematuria   MS:  No joint pain or swelling.  No decreased range of motion.  No back pain.  Psych:  No change in mood or affect. No depression or anxiety.  No memory loss.   Vital Signs BP 138/82 mmHg  Pulse 85  Ht  (1.6 m)  Wt 151 lb 9.6 oz (68.765 kg)  BMI 26.86 kg/m2  SpO2 97%   Physical Exam:  General- No distress,  A&Ox3, pleasant ENT: No sinus tenderness, TM clear, pale nasal mucosa, no oral exudate,no post  nasal drip, no LAN Cardiac: S1, S2, regular rate and rhythm, no murmur Chest: No wheeze/ rales/ dullness; no accessory muscle use, no nasal flaring, no sternal retractions Abd.: Soft Non-tender Ext: No clubbing cyanosis, edema Neuro:  normal strength Skin: No rashes, warm and dry Psych: normal mood and behavior   Assessment/Plan  COPD, severe COPD currently stable with exception of 2 bouts of bronchitis with exacerbation in Dec/ Jan 2016 Compliant with Symbicort Plan: Remember to get your flu shot this fall. Please call the office with the date of your pneumovax vaccine . Continue your Symbicort 1 puff twice daily. We will send in a renewal order for your Symbicort. Rinse mouth after use. Follow up with Dr. Kendrick Fries in 6 months. Please contact office for sooner follow up if symptoms do not improve or worsen or seek emergency care      Tobacco abuse Lung Nodule Follow up: Plan: Follow Up in Lung Screening Program CT in August 2017. Follow up with Dr. Kendrick Fries in 6 months Please contact office for sooner follow up if symptoms do not improve or worsen or seek emergency care      Bevelyn Ngo, NP 10/10/2015  3:50 PM   Attending: I have seen and examined the patient independently and I agree with documentation above Brittany Crosby has been having fairly stable interval since the last visit. Sounds like she did have an episode of bronchitis. On exam: Lungs are clear, normal effort, good spirits Belly soft COPD: This appears to be a stable interval but I am concerned about the exacerbation she experienced. I counseled her today that these episodes are important and we need to know about it when they happen. For now she will continue her current therapy which is attenuated based on her glaucoma. If she has repeated exacerbations she may need to be treated with Roflumilast.  > 50% of today's visit was face to face 27 minute visit  Heber Hayden, MD [facility] Pager:  [phone] Cell: [phone] After 3pm or if no response, call [phone]

## 2015-10-10 NOTE — Assessment & Plan Note (Signed)
COPD currently stable with exception of 2 bouts of bronchitis with exacerbation in Dec/ Jan 2016 Compliant with Symbicort Plan: Remember to get your flu shot this fall. Please call the office with the date of your pneumovax vaccine . Continue your Symbicort 1 puff twice daily. We will send in a renewal order for your Symbicort. Rinse mouth after use. Follow up with Dr. Kendrick FriesMcQuaid in 6 months. Please contact office for sooner follow up if symptoms do not improve or worsen or seek emergency care

## 2015-10-10 NOTE — Patient Instructions (Addendum)
It is nice to meet you today. Remember to get your flu shot this fall. Please call the office with the date of your pneumovax vaccine . Continue your Symbicort 1 puff twice daily. We will send in a renewal order for your Symbicort. Rinse mouth after use. We will schedule a screening CT scan for follow up of pulmonary nodules in August of 2017.. Follow up with Dr. Kendrick FriesMcQuaid in 6 months. Please contact office for sooner follow up if symptoms do not improve or worsen or seek emergency care

## 2015-10-10 NOTE — Assessment & Plan Note (Signed)
Lung Nodule Follow up: Plan: Follow Up in Lung Screening Program CT in August 2017. Follow up with Dr. Kendrick FriesMcQuaid in 6 months Please contact office for sooner follow up if symptoms do not improve or worsen or seek emergency care

## 2015-10-16 ENCOUNTER — Other Ambulatory Visit: Payer: Self-pay | Admitting: Acute Care

## 2015-10-16 DIAGNOSIS — M8589 Other specified disorders of bone density and structure, multiple sites: Secondary | ICD-10-CM | POA: Diagnosis not present

## 2015-10-16 DIAGNOSIS — M859 Disorder of bone density and structure, unspecified: Secondary | ICD-10-CM | POA: Diagnosis not present

## 2015-10-16 DIAGNOSIS — Z87891 Personal history of nicotine dependence: Secondary | ICD-10-CM

## 2015-12-24 ENCOUNTER — Ambulatory Visit (INDEPENDENT_AMBULATORY_CARE_PROVIDER_SITE_OTHER): Payer: Medicare Other | Admitting: Acute Care

## 2015-12-24 ENCOUNTER — Encounter: Payer: Self-pay | Admitting: Acute Care

## 2015-12-24 DIAGNOSIS — Z87891 Personal history of nicotine dependence: Secondary | ICD-10-CM

## 2015-12-24 NOTE — Progress Notes (Signed)
Shared Decision Making Visit Lung Cancer Screening Program [phone])   Eligibility:  Age 75 y.o.  Pack Years Smoking History Calculation  (# packs/per year x # years smoked)  Recent History of coughing up blood  no  Unexplained weight loss? no ( >Than 15 pounds within the last 6 months )  Prior History Lung / other cancer no (Diagnosis within the last 5 years already requiring surveillance chest CT Scans).  Smoking Status Former Smoker  Former Smokers: Years since quit: 3 years  Quit Date: 2013  Visit Components:  Discussion included one or more decision making aids. yes  Discussion included risk/benefits of screening. yes  Discussion included potential follow up diagnostic testing for abnormal scans. yes  Discussion included meaning and risk of over diagnosis. yes  Discussion included meaning and risk of False Positives. yes  Discussion included meaning of total radiation exposure. yes  Counseling Included:  Importance of adherence to annual lung cancer LDCT screening. yes  Impact of comorbidities on ability to participate in the program. yes  Ability and willingness to under diagnostic treatment. yes  Smoking Cessation Counseling:  Current Smokers:   Discussed importance of smoking cessation. {NA Former smoker  Information about tobacco cessation classes and interventions provided to patient. yes  Patient provided with "ticket" for LDCT Scan. yes  Symptomatic Patient. no  Counseling  Diagnosis Code: Tobacco Use Z72.0  Asymptomatic Patient yes  Counseling   Former Smokers:   Discussed the importance of maintaining cigarette abstinence. yes  Diagnosis Code: Personal History of Nicotine Dependence. S23.953  Information about tobacco cessation classes and interventions provided to patient. Yes  Patient provided with "ticket" for LDCT Scan. yes  Written Order for Lung Cancer Screening with LDCT placed in Epic. Yes (CT Chest Lung Cancer Screening  Low Dose W/O CM) [id number] Z12.2-Screening of respiratory organs Z87.891-Personal history of nicotine dependence  I spent 20 minutes of face to face time with [name] discussing the risks and benefits of lung cancer screening. We viewed a power point together that explained in detail the above noted topics. We took the time to pause the power point at intervals to allow for questions to be asked and answered to ensure understanding. We discussed that she had taken the single most powerful action possible to decrease her risk of developing lung cancer when she quit smoking. I counseled her to remain smoke free, and to contact me if she ever had the desire to smoke again so that I can provide resources and tools to help support the effort to remain smoke free. We discussed the time and location of the scan, and that either Jerolyn Shin, CMA or I will call with the results within  24-48 hours of receiving them. She has my card and contact information in the event she needs to speak with me, in addition to a copy of the power point we reviewed as a resource. She verbalized understanding of all of the above and had no further questions upon leaving the office.   Bevelyn Ngo, NP 12/24/2015

## 2016-01-07 DIAGNOSIS — H2511 Age-related nuclear cataract, right eye: Secondary | ICD-10-CM | POA: Diagnosis not present

## 2016-01-15 ENCOUNTER — Ambulatory Visit (INDEPENDENT_AMBULATORY_CARE_PROVIDER_SITE_OTHER)
Admission: RE | Admit: 2016-01-15 | Discharge: 2016-01-15 | Disposition: A | Discharge status: Home / Self Care | Payer: Medicare Other | Source: Ambulatory Visit | Attending: Acute Care | Admitting: Acute Care

## 2016-01-15 DIAGNOSIS — Z87891 Personal history of nicotine dependence: Secondary | ICD-10-CM

## 2016-02-16 DIAGNOSIS — Z23 Encounter for immunization: Secondary | ICD-10-CM | POA: Diagnosis not present

## 2016-02-26 ENCOUNTER — Ambulatory Visit (INDEPENDENT_AMBULATORY_CARE_PROVIDER_SITE_OTHER): Payer: Medicare Other | Admitting: Neurology

## 2016-02-26 ENCOUNTER — Encounter: Payer: Self-pay | Admitting: Neurology

## 2016-02-26 DIAGNOSIS — R600 Localized edema: Secondary | ICD-10-CM

## 2016-02-26 DIAGNOSIS — G20 Parkinson's disease: Secondary | ICD-10-CM

## 2016-02-26 MED ORDER — CARBIDOPA-LEVODOPA 25-100 MG PO TABS: 1.0000 | ORAL_TABLET | Freq: Three times a day (TID) | ORAL | 3 refills | Status: DC

## 2016-02-26 NOTE — Patient Instructions (Signed)
Please prop up your feet when sitting.   Please stay well hydrated with water, and exercise in the form of walking. Use your cane.

## 2016-02-26 NOTE — Progress Notes (Signed)
Subjective:    Patient ID: Brittany Crosby is a 75 y.o. female.  HPI     Interim history:   [name] is a 75 year old right-handed woman with an underlying medical history of lower back pain with history of herniated disc, hyperlipidemia, glaucoma, cataracts, hiatal hernia, depression, anxiety, hypertension, osteopenia, and COPD in the context of prior smoking (quit some 3 years ago), status post tonsillectomy, appendectomy, cholecystectomy, bladder surgery and abdominal hysterectomy, who presents for followup consultation of her right hand tremor, evidence of parkinsonism. The patient is unaccompanied today. I last saw her on 08/27/2015, at which time she reported doing well, denied any recent issues with memory, mood, hallucinations or constipation. She had some occasional lightheadedness. She lives with Brittany Crosby (BF of 20 years). She has 2 daughters from her 1st marriage, 2 sons with her second husband, Brittany Crosby has 3 children from before. I suggested she continue with Sinemet 1 pill 3 times a day.  Today, 02/26/2016: She reports doing okay, no falls, uses a cane outside. She and Brittany Crosby have moved into a ranch style house from an apartment. They have a yard and they have 2 dogs. She takes the dogs out. Worries about her sons in [place]. Sees Dr. Lake Bells in pulmonology.   Previously:  I saw her on 04/28/2015, at which time she reported a recent fall secondary to slipping on tile where she had spilled baby oil. She did go to the emergency room on 03/24/2015. She was noted to be ambulatory. She reported some rib pain. She had x-rays of sacrum, coccyx, pelvis and ribs. Thankfully she had no acute injuries. I reviewed the emergency room records and x-ray results at the time. She was diagnosed with sacral contusion and chest wall contusion and was discharged to home. She had some bruising and residual pain but then improved with time. She had no sequelae. She felt that the Sinemet was helpful and  that her dexterity, mobility and tremors were better. She reported some weight loss. She was using boost once a day. Her mood was at times a little depressed. She was on Celexa. She had short-term memory issues which she felt were stable. I suggested she continue Sinemet 1 pill 3 times a day. She was advised to use her cane at all times and turns slowly.   I saw her on 12/23/2014, at which time she reported more difficulty with her right side, more insecurity with walking, more stiffness and more difficulty with fine motor skills. She was using a cane. Thankfully she had not fallen. She had noted more difficulty with dressing especially with tying her shoes. I felt that she had more signs and symptoms of parkinsonism, and I suggested trial of Sinemet, 25-100 milligrams strength, 1/2 pill twice daily x 1 week, then 1/2 pill 3 times a day x 1 week, then 1 pill 3 times a day thereafter.   I saw her on 06/24/2014, at which time she reported feeling about the same with her tremors. She felt that the gabapentin had helped some. She had no recent falls. I felt her exam was fairly stable and I asked her to continue with low dose gabapentin.   I saw her on 01/03/2014, at which time I did note a lateralized tremor to the right but no telltale signs of parkinsonism and I asked her to continue with gabapentin. She reported being on oxygen at night and she had started a new inhaler for her COPD which helped her breathing.    I first  met her on 08/21/2013 at the request of her primary care physician at which time she reported a one-year history of gradual onset and progressive R hand tremor, affecting her handwriting (which has become smaller) and her feeding skills. Some months later, she noted a R foot tremor. At the time of her first visit I felt that there was a chance she had no telltale signs of Parkinson's disease or parkinsonism but this could not be completely excluded. I suggested a brain MRI with and without  contrast. She had a brain MRI with and without contrast on 09/17/2013: Mild periventricular and subcortical non-specific gliosis. These findings are non-specific and considerations include autoimmune, inflammatory, post-infectious, microvascular ischemic or migraine associated etiologies. 2. No acute findings. In addition, personally reviewed the images through the PACS system. We called her with the test results. She called in 4/15 for ongoing tremors and had already reduced her caffeine intake as recommended and I called in low dose gabapentin with titration and she is now on 300 mg qHS. She thinks her tremor is a little better and she has residual LE tremor on the R and mild morning grogginess.      She reported difficulty getting out of a chair and reported balance problems. She reported a total of 4 falls in 12 months. No LOC, no head injury was reported. She reported no FHx of PD, but MGM had MS and daughter has dystonia. Her mother had a head tremor. She denied exposure to pesticides or other harsh chemicals. She worked in [employer]. She does not drink alcohol. She does drink sweet tea and 3 cans of cola per day. She has been on Celexa for the past 2 years. She has no history of neuroleptic medication use or Reglan. She had blood work on 08/16/13: CBC with diff, TSH, ESR and B12.   Her Past Medical History Is Significant For: Past Medical History:  Diagnosis Date  . Anxiety   . Colon polyps   . COPD (chronic obstructive pulmonary disease) ([place])   . Disorder of bone and cartilage, unspecified   . Glaucoma   . Glaucoma    unspecified  . H/O falling   . Hiatal hernia   . Hypercholesteremia   . Major depressive disorder, single episode, unspecified   . Nicotine dependence    history  . Thyroid disease hypothyroidism    Her Past Surgical History Is Significant For: Past Surgical History:  Procedure Laterality Date  . ABDOMINAL HYSTERECTOMY    . APPENDECTOMY    . BACK SURGERY    .  BLADDER AUGMENTATION    . CHOLECYSTECTOMY    . TONSILLECTOMY      Her Family History Is Significant For: Family History  Problem Relation Age of Onset  . Dementia Mother   . Heart attack Father     Her Social History Is Significant For: Social History   Social History  . Marital status: Single    Spouse name: Brittany Crosby  . Number of children: 4  . Years of education: college   Occupational History  . retired    Social History Main Topics  . Smoking status: Former Smoker    Packs/day: 1.25    Years: 30.00    Types: Cigarettes    Quit date: 11/22/2011  . Smokeless tobacco: Never Used     Comment: Remains smoke free  . Alcohol use No  . Drug use: No  . Sexual activity: Yes    Birth control/ protection: Surgical   Other  Topics Concern  . None   Social History Narrative   Patient is right handed, resides with signficant other in a home    Her Allergies Are:  Allergies  Allergen Reactions  . Codeine Other (See Comments)    Abdominal pain  . Sulfa Antibiotics Swelling  . Penicillins Rash  :   Her Current Medications Are:  Outpatient Encounter Prescriptions as of 02/26/2016  Medication Sig  . aspirin EC 81 MG tablet Take 81 mg by mouth daily.  . carbidopa-levodopa (SINEMET IR) 25-100 MG tablet Take 1 tablet by mouth 3 (three) times daily.  . citalopram (CELEXA) 40 MG tablet Take 40 mg by mouth daily.  Marland Kitchen gabapentin (NEURONTIN) 300 MG capsule Take 1 capsule (300 mg total) by mouth at bedtime.  Marland Kitchen latanoprost (XALATAN) 0.005 % ophthalmic solution Place 1 drop into both eyes at bedtime.  Marland Kitchen levothyroxine (SYNTHROID, LEVOTHROID) 88 MCG tablet Take 88 mcg by mouth daily.  . Multiple Vitamins-Minerals (ONE-A-DAY WOMENS 50+ ADVANTAGE PO) Take 1 tablet by mouth daily.  . NON FORMULARY 2lpm at bedtime.  Marland Kitchen PROAIR HFA 108 (90 BASE) MCG/ACT inhaler   . Spacer/Aero-Holding Chambers (AEROCHAMBER MV) inhaler Use as instructed  . SYMBICORT 160-4.5 MCG/ACT inhaler INHALE 1 PUFF INTO THE  LUNGS TWICE DAILY   No facility-administered encounter medications on file as of 02/26/2016.   :  Review of Systems:  Out of a complete 14 point review of systems, all are reviewed and negative with the exception of these symptoms as listed below:  Review of Systems  Neurological:       No new concerns per patient.     Objective:  Neurologic Exam  Physical Exam Physical Examination:   Vitals:   02/26/16 1404  BP: 122/60  Pulse: 72  Resp: 14   General Examination: The patient is a very pleasant 75 y.o. female in no acute distress.  HEENT: Normocephalic, atraumatic, pupils are equal, round and reactive to light and accommodation. She has had bilateral cataract repairs, eyes look a little dry. Extraocular tracking shows mild saccadic breakdown without nystagmus noted. There is no limitation to her gaze. There is no decrease in eye blink rate. Hearing is intact. Face is symmetric with slight facial masking and normal facial sensation. There is no lip, neck or jaw tremor. Neck is mildly with intact passive ROM. There are no carotid bruits on auscultation. Oropharynx exam reveals mild mouth dryness. Moderate airway crowding is noted, due to narrow airway and thick soft palate. She has dentures in the upper and is nearly edentulous in the lower. She  Mallampati is class II. Tongue protrudes centrally and palate elevates symmetrically. There is no drooling.   Chest: is clear to auscultation without wheezing, rhonchi or crackles noted.  Heart: sounds are regular and normal without murmurs, rubs or gallops noted.   Abdomen: is soft, non-tender and non-distended with normal bowel sounds appreciated on auscultation.  Extremities: There is 1+ pitting edema in the distal lower extremities bilaterally, around the ankles only, R more than L. Pedal pulses are intact. There are no varicose veins.  Skin: is warm and dry with no trophic changes noted. Age-related changes are noted on the skin.    Musculoskeletal: exam reveals no obvious joint deformities, tenderness, joint swelling or erythema.  Neurologically:   Mental status: The patient is awake and alert, paying good  attention. She is able to completely provide the history. She is oriented to: person, place, time/date, situation, day of week, month of year  and year. Her memory, attention, language and knowledge are intact. There is no aphasia, agnosia, apraxia or anomia. There is a no evidence of bradyphrenia. Speech is mildly hypophonic with no dysarthria noted. Mood is congruent and affect is normal.   Cranial nerves are as described above under HEENT exam. In addition, shoulder shrug is normal with equal shoulder height noted.  Motor exam: Normal bulk, and strength for age is noted. There are very mild dyskinesias noted in the R leg.  Tone is  increased in the right upper and lower extremities with mild cogwheeling noted. There is mild bradykinesia. There is no drift or rebound.  There is a slight intermittent resting tremor on the right lower extremity and no resting tremor in the right upper extremity. She has no resting tremor on the left. Romberg is negative, but shows some swaying, unchanged.  Reflexes are 2+ in the upper extremities and 2+ in the lower extremities.  Fine motor skills exam: Finger taps, rapid alternating tapping and hand movements are all mild to moderately impaired on the right and minimally to not impaired on the left. The same is true for lower body foot agility and foot tapping. These are progressive changes from last time.  Cerebellar testing shows no dysmetria or intention tremor on finger to nose testing. Heel to shin is unremarkable bilaterally, but slow. There is no truncal or gait ataxia.   Sensory exam is intact to light touch in the upper and lower extremities.   Gait, station and balance: She stands up from the seated position with mild difficulty and does need to push up with Her hands. She  needs no assistance, and takes 1 attempt. No veering to one side is noted. She is not noted to lean to the side. Posture is mild to moderately stooped, has a tendency to look down. She walks with decrease in stride length and pace and decreased arm swing on the right, unchanged. She turns in 3 steps. Tandem walk is not possible. Balance is mildly impaired.     Assessment and Plan:   In summary, Athleen Feltner is a very pleasant 75 year old female with an underlying medical history of lower back pain with history of herniated disc, hyperlipidemia, glaucoma, cataracts, hiatal hernia, depression, anxiety, hypertension, osteopenia, emphysema and COPD in the context of prior smoking (quit 3 years ago), status post tonsillectomy, appendectomy, cholecystectomy, bladder surgery and abdominal hysterectomy, who presents for follow-up consultation of her parkinsonism, probably right-sided predominant Parkinson's disease, symptoms started about 3 to 4 years ago with right-sided tremors, handwriting difficulties, fine motor difficulties and gait changes as well as stiffness. She has been on Sinemet 1 pill 3 times a day, tolerating this well. She feels that it has helped and reports no sinister side effects, but does c/o some EDS, takes some inadvertent naps. However, she does not feel sleepy after all 3 doses. She feels more sleepy in the late morning hours. Of note, she takes gabapentin at night and also her Celexa at night which could be medication effect that causes her to be more sleepy in the early part of the day. Her physical exam has been stable, weight has been stable, right around 150 lb. I refilled her medication for Sinemet for 90 days supply. We mutually agreed to continue with the current dose. She is advised to stay well hydrated with water and use her cane for safety. She is advised to stay active physically as well and pursue a healthy, nutritious diet. Constipation is  not currently an issue. Mood and  memory are stable. I would like to see her back in 6 months, sooner if the need arises. I answered all her questions today and she was in agreement. I spent 25 minutes in total face-to-face time with the patient, more than 50% of which was spent in counseling and coordination of care, reviewing test results, reviewing medication and discussing or reviewing the diagnosis of PD, its prognosis and treatment options.

## 2016-03-10 ENCOUNTER — Other Ambulatory Visit: Payer: Self-pay

## 2016-03-10 ENCOUNTER — Telehealth: Payer: Self-pay | Admitting: Acute Care

## 2016-03-10 DIAGNOSIS — Z87891 Personal history of nicotine dependence: Secondary | ICD-10-CM

## 2016-03-10 MED ORDER — BUDESONIDE-FORMOTEROL FUMARATE 160-4.5 MCG/ACT IN AERO: 2.0000 | INHALATION_SPRAY | Freq: Two times a day (BID) | RESPIRATORY_TRACT | 1 refills | Status: AC

## 2016-03-10 NOTE — Telephone Encounter (Signed)
The results of Brittany Crosby's low dose screening CT were called to her 01/19/2016. I told her in the call that her scan was read as a Lung RADS 2: nodules that are benign in appearance and behavior with a very low likelihood of becoming a clinically active cancer due to size or lack of growth. Recommendation per radiology is for a repeat LDCT in 12 months. I told her that we will schedule and order the scan for August 2018. I also indicated that we would send a copy of the scan to her primary care physician. We did discuss the additional findings of aortic atherosclerosis and mild emphysema. I explained to her that aortic atherosclerosis with a very common finding in the CT screening scans. I explained that this is a non-gated exam and green or severity cannot be determined. I will fax a copy of the results to her primary care provider Dr. Sigmund HazelLisa Miller for follow-up as she feels is clinically indicated. The patient verbalized understanding of the results and had no further questions at completion of the call.

## 2016-03-24 ENCOUNTER — Other Ambulatory Visit: Payer: Self-pay | Admitting: Obstetrics & Gynecology

## 2016-03-24 DIAGNOSIS — Z1231 Encounter for screening mammogram for malignant neoplasm of breast: Secondary | ICD-10-CM

## 2016-04-07 ENCOUNTER — Ambulatory Visit: Payer: Medicare Other

## 2016-04-07 DIAGNOSIS — H401131 Primary open-angle glaucoma, bilateral, mild stage: Secondary | ICD-10-CM | POA: Diagnosis not present

## 2016-04-08 DIAGNOSIS — F329 Major depressive disorder, single episode, unspecified: Secondary | ICD-10-CM | POA: Diagnosis not present

## 2016-04-08 DIAGNOSIS — E039 Hypothyroidism, unspecified: Secondary | ICD-10-CM | POA: Diagnosis not present

## 2016-04-08 DIAGNOSIS — G20 Parkinson's disease: Secondary | ICD-10-CM | POA: Diagnosis not present

## 2016-04-08 DIAGNOSIS — J449 Chronic obstructive pulmonary disease, unspecified: Secondary | ICD-10-CM | POA: Diagnosis not present

## 2016-04-12 ENCOUNTER — Ambulatory Visit
Admission: RE | Admit: 2016-04-12 | Discharge: 2016-04-12 | Disposition: A | Discharge status: Home / Self Care | Payer: Medicare Other | Source: Ambulatory Visit | Attending: Obstetrics & Gynecology | Admitting: Obstetrics & Gynecology

## 2016-04-12 DIAGNOSIS — Z1231 Encounter for screening mammogram for malignant neoplasm of breast: Secondary | ICD-10-CM | POA: Diagnosis not present

## 2016-04-22 ENCOUNTER — Other Ambulatory Visit (INDEPENDENT_AMBULATORY_CARE_PROVIDER_SITE_OTHER): Payer: Medicare Other

## 2016-04-22 ENCOUNTER — Encounter: Payer: Self-pay | Admitting: Pulmonary Disease

## 2016-04-22 ENCOUNTER — Ambulatory Visit (INDEPENDENT_AMBULATORY_CARE_PROVIDER_SITE_OTHER): Payer: Medicare Other | Admitting: Pulmonary Disease

## 2016-04-22 DIAGNOSIS — Z720 Tobacco use: Secondary | ICD-10-CM | POA: Diagnosis not present

## 2016-04-22 DIAGNOSIS — R5383 Other fatigue: Secondary | ICD-10-CM

## 2016-04-22 DIAGNOSIS — J449 Chronic obstructive pulmonary disease, unspecified: Secondary | ICD-10-CM | POA: Diagnosis not present

## 2016-04-22 DIAGNOSIS — G4734 Idiopathic sleep related nonobstructive alveolar hypoventilation: Secondary | ICD-10-CM

## 2016-04-22 LAB — CBC
HCT: 37.8 % (ref 36.0–46.0)
HEMOGLOBIN: 12.7 g/dL (ref 12.0–15.0)
MCHC: 33.5 g/dL (ref 30.0–36.0)
MCV: 91.2 fl (ref 78.0–100.0)
Platelets: 228 10*3/uL (ref 150.0–400.0)
RBC: 4.14 Mil/uL (ref 3.87–5.11)
RDW: 13.3 % (ref 11.5–15.5)
WBC: 8.3 10*3/uL (ref 4.0–10.5)

## 2016-04-22 LAB — TSH: TSH: 0.62 u[IU]/mL (ref 0.35–4.50)

## 2016-04-22 NOTE — Assessment & Plan Note (Signed)
This has been a stable interval for Brittany Crosby. She has not had an exacerbation since the last visit. She remains compliant with Symbicort.  Plan: Continue Symbicort twice a day Flu shot is up-to-date Pneumonia vaccines are up-to-date Follow-up 6 months

## 2016-04-22 NOTE — Assessment & Plan Note (Signed)
Continue plans for CT scan screening for lung cancer in 2018

## 2016-04-22 NOTE — Assessment & Plan Note (Signed)
Continue 2 L [place] qHS

## 2016-04-22 NOTE — Patient Instructions (Signed)
Keep taking your medications as you are doing Keep your appointment for the next CT scan next year We will see you back in 6 months or sooner if needed

## 2016-04-22 NOTE — Assessment & Plan Note (Signed)
This is a new problem. While she has some daytime somnolence she does not really have any other features of obstructive sleep apnea and that she feels well rested in the mornings currently and she has no morning headaches or lack of concentration. However, she is at increased risk for obstructive sleep apnea with her COPD. Today we spent a significant amount of time talking about undergoing a sleep study but she would prefer to defer this at this time.  Plan: Check TSH Check hemoglobin If she changes her mind about a sleep study on happy to order

## 2016-04-22 NOTE — Progress Notes (Signed)
Subjective:    Patient ID: Brittany Crosby, female    DOB: 10-26-1940, 75 y.o.   MRN: [id number]  Synopsis: GOLD Grade C COPD, severe airflow obstruction, has gluacoma  HPI Chief Complaint  Patient presents with  . Follow-up    pt c/o sob with exertion worse in cold weather.  denies chest/sinus congestion.     [name] had a good Thanksgiving this year.  She says that her breathing has just been OK.  Sometimes she says that she is running out of energy, not necessarily running out of breath.  For example she is OK from 8-2 then she is very sleepy and naps nearly every day.  Doesn't have trouble breathing.  Feel well rested in the daytime.  She has a dry cough.  No mucus production.  She has a raspy voice in the evenings, no heartburn but has indigestions.    Past Medical History:  Diagnosis Date  . Anxiety   . Colon polyps   . COPD (chronic obstructive pulmonary disease) ([facility])   . Disorder of bone and cartilage, unspecified   . Glaucoma   . Glaucoma    unspecified  . H/O falling   . Hiatal hernia   . Hypercholesteremia   . Major depressive disorder, single episode, unspecified   . Nicotine dependence    history  . Thyroid disease hypothyroidism     Review of Systems  Constitutional: Positive for fatigue. Negative for chills and fever.  HENT: Negative for postnasal drip, rhinorrhea and sinus pain.   Respiratory: Positive for shortness of breath. Negative for cough and wheezing.   Cardiovascular: Negative for chest pain, palpitations and leg swelling.       Objective:   Physical Exam Vitals:   04/22/16 0914  BP: 126/64  BP Location: Left Arm  Cuff Size: Normal  Pulse: 72  SpO2: 97%  Weight: 143 lb (64.9 kg)  Height: 5\' 3"  (1.6 m)  RA  Gen: well appearing, no acute distress HEENT: NCAT,  EOMi, OP clear,  PULM: CTA B CV: RRR, no mgr, no JVD AB: BS+, soft, nontender,  Ext: warm, no edema, no clubbing, no cyanosis Derm: no rash or skin breakdown Neuro: A&Ox4,  MAEW  Last CBC on file was in 2013, normal  CT report from this year was RADS2, images showed emphysema, personally reviewed      Assessment & Plan:   COPD, severe This has been a stable interval for [place]. She has not had an exacerbation since the last visit. She remains compliant with Symbicort.  Plan: Continue Symbicort twice a day Flu shot is up-to-date Pneumonia vaccines are up-to-date Follow-up 6 months  Tobacco abuse Continue plans for CT scan screening for lung cancer in 2018  Fatigue This is a new problem. While she has some daytime somnolence she does not really have any other features of obstructive sleep apnea and that she feels well rested in the mornings currently and she has no morning headaches or lack of concentration. However, she is at increased risk for obstructive sleep apnea with her COPD. Today we spent a significant amount of time talking about undergoing a sleep study but she would prefer to defer this at this time.  Plan: Check TSH Check hemoglobin If she changes her mind about a sleep study on happy to order   Updated Medication List Outpatient Encounter Prescriptions as of 04/22/2016  Medication Sig  . aspirin EC 81 MG tablet Take 81 mg by mouth daily.  . budesonide-formoterol (SYMBICORT)  160-4.5 MCG/ACT inhaler Inhale 2 puffs into the lungs 2 (two) times daily.  . carbidopa-levodopa (SINEMET IR) 25-100 MG tablet Take 1 tablet by mouth 3 (three) times daily.  . citalopram (CELEXA) 40 MG tablet Take 40 mg by mouth daily.  Marland Kitchen. gabapentin (NEURONTIN) 300 MG capsule Take 1 capsule (300 mg total) by mouth at bedtime.  Marland Kitchen. latanoprost (XALATAN) 0.005 % ophthalmic solution Place 1 drop into both eyes at bedtime.  Marland Kitchen. levothyroxine (SYNTHROID, LEVOTHROID) 88 MCG tablet Take 88 mcg by mouth daily.  . Multiple Vitamins-Minerals (ONE-A-DAY WOMENS 50+ ADVANTAGE PO) Take 1 tablet by mouth daily.  . NON FORMULARY 2lpm at bedtime.  Marland Kitchen. PROAIR HFA 108 (90 BASE) MCG/ACT  inhaler   . Spacer/Aero-Holding Chambers (AEROCHAMBER MV) inhaler Use as instructed   No facility-administered encounter medications on file as of 04/22/2016.

## 2016-05-20 ENCOUNTER — Telehealth: Payer: Self-pay | Admitting: Pulmonary Disease

## 2016-05-20 NOTE — Telephone Encounter (Signed)
Called and spoke with pt and she is aware that I will call and find out why the pharmacy is not filling the rx.    Called and spoke with the pharmacy and they stated that the pt was requesting refills from an old rx.  She will get the new rx ready for the pt.  Nothing further is needed.

## 2016-07-06 DIAGNOSIS — H401131 Primary open-angle glaucoma, bilateral, mild stage: Secondary | ICD-10-CM | POA: Diagnosis not present

## 2016-07-22 DIAGNOSIS — R197 Diarrhea, unspecified: Secondary | ICD-10-CM | POA: Diagnosis not present

## 2016-08-18 ENCOUNTER — Encounter: Payer: Self-pay | Admitting: Neurology

## 2016-08-22 ENCOUNTER — Other Ambulatory Visit: Payer: Self-pay | Admitting: Neurology

## 2016-08-26 ENCOUNTER — Ambulatory Visit: Payer: Medicare Other | Admitting: Neurology

## 2016-09-14 ENCOUNTER — Ambulatory Visit (INDEPENDENT_AMBULATORY_CARE_PROVIDER_SITE_OTHER): Payer: Medicare Other | Admitting: Neurology

## 2016-09-14 ENCOUNTER — Encounter: Payer: Self-pay | Admitting: Neurology

## 2016-09-14 VITALS — BP 144/73 | HR 81 | Ht 64.0 in | Wt 136.0 lb

## 2016-09-14 DIAGNOSIS — R413 Other amnesia: Secondary | ICD-10-CM

## 2016-09-14 DIAGNOSIS — Z9181 History of falling: Secondary | ICD-10-CM

## 2016-09-14 DIAGNOSIS — R4589 Other symptoms and signs involving emotional state: Secondary | ICD-10-CM | POA: Diagnosis not present

## 2016-09-14 DIAGNOSIS — G20 Parkinson's disease: Secondary | ICD-10-CM

## 2016-09-14 MED ORDER — GABAPENTIN 100 MG PO CAPS: 200.0000 mg | ORAL_CAPSULE | Freq: Every day | ORAL | 3 refills | Status: AC

## 2016-09-14 MED ORDER — CARBIDOPA-LEVODOPA 25-100 MG PO TABS: 1.0000 | ORAL_TABLET | Freq: Four times a day (QID) | ORAL | 3 refills | Status: AC

## 2016-09-14 NOTE — Patient Instructions (Addendum)
In order to streamline your meds and avoid any additional reasons to have balance issues, I would like to phase out the neurontin/gabapentin. Stop the 300 mg capsules and start Start Neurontin (gabapentin) 100 mg strength: Take 2 pills each night at bedtime. The most common side effects reported are sedation or sleepiness. Rare side effects include balance problems, confusion.  Down the road, we will reduce to 100 mg for a while, and then we will stop. We will address again during our next visit in 3 months, you can see Aundra Millet or Eber Jones.   We will increase your Sinemet to 1 pill 4 times a day: at 8 AM, 12, 4 PM and 8 PM.  Talk to Dr. Hyacinth Meeker about your residual depression.   I will prescribe a 2 wheeled walker.

## 2016-09-14 NOTE — Progress Notes (Signed)
Subjective:    Patient ID: Brittany Crosby is a 76 y.o. female.  HPI     Interim history:   Brittany Crosby is a 76 year old right-handed woman with an underlying medical history of lower back pain with history of herniated disc, hyperlipidemia, glaucoma, cataracts, hiatal hernia, depression, anxiety, hypertension, osteopenia, and COPD in the context of prior smoking (quit some 3 years ago), status post tonsillectomy, appendectomy, cholecystectomy, bladder surgery and abdominal hysterectomy, who presents for followup consultation of her right hand tremor, with evidence of parkinsonism. The patient is unaccompanied today. I last saw her on 02/26/2016, at which time she reported doing okay, had no recent falls, was using a cane for outside. She moved into a new home, ranch style. She was followed by pulmonology. She was worried about her sons in [place]. I suggested she continue with Sinemet 1 pill 3 times a day.   Today, 09/14/2016 (all dictated new, as well as above notes, some dictation done in note pad or Word, outside of chart, may appear as copied):  She reports she has fallen at least 2 times, has to climb 3 stairs to get in the house and 3 steps down to laundry room. Sore L shoulder, but no other injuries. appetite lower, eats little and slowly. Has lost weight, drinks Ensure each night. Short term memory worse, stress more. Lost dog to cancer. Son had burst appendix, while at [facility]. Kids are in [place]. Brittany Crosby has 2 kids in [place], but he would like to move to [facility] post retirement.    The patient's allergies, current medications, family history, past medical history, past social history, past surgical history and problem list were reviewed and updated as appropriate.   Previously (copied from previous notes for reference):    I saw her on 08/27/2015, at which time she reported doing well, denied any recent issues with memory, mood, hallucinations or constipation. She had some occasional  lightheadedness. She lives with Brittany Crosby (BF of 20 years). She has 2 daughters from her 1st marriage, 2 sons with her second husband, Brittany Crosby has 3 children from before. I suggested she continue with Sinemet 1 pill 3 times a day.   I saw her on 04/28/2015, at which time she reported a recent fall secondary to slipping on tile where she had spilled baby oil. She did go to the emergency room on 03/24/2015. She was noted to be ambulatory. She reported some rib pain. She had x-rays of sacrum, coccyx, pelvis and ribs. Thankfully she had no acute injuries. I reviewed the emergency room records and x-ray results at the time. She was diagnosed with sacral contusion and chest wall contusion and was discharged to home. She had some bruising and residual pain but then improved with time. She had no sequelae. She felt that the Sinemet was helpful and that her dexterity, mobility and tremors were better. She reported some weight loss. She was using boost once a day. Her mood was at times a little depressed. She was on Celexa. She had short-term memory issues which she felt were stable. I suggested she continue Sinemet 1 pill 3 times a day. She was advised to use her cane at all times and turns slowly.   I saw her on 12/23/2014, at which time she reported more difficulty with her right side, more insecurity with walking, more stiffness and more difficulty with fine motor skills. She was using a cane. Thankfully she had not fallen. She had noted more difficulty with dressing especially with tying her  shoes. I felt that she had more signs and symptoms of parkinsonism, and I suggested trial of Sinemet, 25-100 milligrams strength, 1/2 pill twice daily x 1 week, then 1/2 pill 3 times a day x 1 week, then 1 pill 3 times a day thereafter.   I saw her on 06/24/2014, at which time she reported feeling about the same with her tremors. She felt that the gabapentin had helped some. She had no recent falls. I felt her exam was fairly stable  and I asked her to continue with low dose gabapentin.   I saw her on 01/03/2014, at which time I did note a lateralized tremor to the right but no telltale signs of parkinsonism and I asked her to continue with gabapentin. She reported being on oxygen at night and she had started a new inhaler for her COPD which helped her breathing.    I first met her on 08/21/2013 at the request of her primary care physician at which time she reported a one-year history of gradual onset and progressive R hand tremor, affecting her handwriting (which has become smaller) and her feeding skills. Some months later, she noted a R foot tremor. At the time of her first visit I felt that there was a chance she had no telltale signs of Parkinson's disease or parkinsonism but this could not be completely excluded. I suggested a brain MRI with and without contrast. She had a brain MRI with and without contrast on 09/17/2013: Mild periventricular and subcortical non-specific gliosis. These findings are non-specific and considerations include autoimmune, inflammatory, post-infectious, microvascular ischemic or migraine associated etiologies. 2. No acute findings. In addition, personally reviewed the images through the PACS system. We called her with the test results. She called in 4/15 for ongoing tremors and had already reduced her caffeine intake as recommended and I called in low dose gabapentin with titration and she is now on 300 mg qHS. She thinks her tremor is a little better and she has residual LE tremor on the R and mild morning grogginess.    She reported difficulty getting out of a chair and reported balance problems. She reported a total of 4 falls in 12 months. No LOC, no head injury was reported. She reported no FHx of PD, but MGM had MS and daughter has dystonia. Her mother had a head tremor. She denied exposure to pesticides or other harsh chemicals. She worked in [employer]. She does not drink alcohol. She does drink  sweet tea and 3 cans of cola per day. She has been on Celexa for the past 2 years. She has no history of neuroleptic medication use or Reglan. She had blood work on 08/16/13: CBC with diff, TSH, ESR and B12.   Her Past Medical History Is Significant For: Past Medical History:  Diagnosis Date  . Anxiety   . Colon polyps   . COPD (chronic obstructive pulmonary disease) ([place])   . Disorder of bone and cartilage, unspecified   . Glaucoma   . Glaucoma    unspecified  . H/O falling   . Hiatal hernia   . Hypercholesteremia   . Major depressive disorder, single episode, unspecified   . Nicotine dependence    history  . Thyroid disease hypothyroidism    Her Past Surgical History Is Significant For: Past Surgical History:  Procedure Laterality Date  . ABDOMINAL HYSTERECTOMY    . APPENDECTOMY    . BACK SURGERY    . BLADDER AUGMENTATION    . CHOLECYSTECTOMY    .  TONSILLECTOMY      Her Family History Is Significant For: Family History  Problem Relation Age of Onset  . Dementia Mother   . Heart attack Father     Her Social History Is Significant For: Social History   Social History  . Marital status: Single    Spouse name: Brittany Crosby  . Number of children: 4  . Years of education: college   Occupational History  . retired    Social History Main Topics  . Smoking status: Former Smoker    Packs/day: 1.25    Years: 30.00    Types: Cigarettes    Quit date: 11/22/2011  . Smokeless tobacco: Never Used     Comment: Remains smoke free  . Alcohol use No  . Drug use: No  . Sexual activity: Yes    Birth control/ protection: Surgical   Other Topics Concern  . None   Social History Narrative   Patient is right handed, resides with signficant other in a home    Her Allergies Are:  Allergies  Allergen Reactions  . Codeine Other (See Comments)    Abdominal pain  . Sulfa Antibiotics Swelling  . Penicillins Rash  :   Her Current Medications Are:  Outpatient Encounter  Prescriptions as of 09/14/2016  Medication Sig  . aspirin EC 81 MG tablet Take 81 mg by mouth daily.  . budesonide-formoterol (SYMBICORT) 160-4.5 MCG/ACT inhaler Inhale 2 puffs into the lungs 2 (two) times daily.  . carbidopa-levodopa (SINEMET IR) 25-100 MG tablet Take 1 tablet by mouth 3 (three) times daily.  . citalopram (CELEXA) 40 MG tablet Take 40 mg by mouth daily.  Marland Kitchen gabapentin (NEURONTIN) 300 MG capsule TAKE 1 CAPSULE (300 MG TOTAL) BY MOUTH AT BEDTIME.  Marland Kitchen latanoprost (XALATAN) 0.005 % ophthalmic solution Place 1 drop into both eyes at bedtime.  Marland Kitchen levothyroxine (SYNTHROID, LEVOTHROID) 88 MCG tablet Take 88 mcg by mouth daily.  . Multiple Vitamins-Minerals (ONE-A-DAY WOMENS 50+ ADVANTAGE PO) Take 1 tablet by mouth daily.  . NON FORMULARY 2lpm at bedtime.  Marland Kitchen PROAIR HFA 108 (90 BASE) MCG/ACT inhaler   . Spacer/Aero-Holding Chambers (AEROCHAMBER MV) inhaler Use as instructed   No facility-administered encounter medications on file as of 09/14/2016.   :  Review of Systems:  Out of a complete 14 point review of systems, all are reviewed and negative with the exception of these symptoms as listed below: Review of Systems  Neurological:       Pt presents today to follow up on her PD. Pt does report feeling more off balance, increase in tremors, and has had 2 recent falls.    Objective:  Neurologic Exam  Physical Exam Physical Examination:   Vitals:   09/14/16 1413  BP: (!) 144/73  Pulse: 81   General Examination: The patient is a very pleasant 76 y.o. female in no acute distress. She appears well-developed and well-nourished and well groomed. Frail appearing, has lost weight.   HEENT: Normocephalic, atraumatic, pupils are equal, round and reactive to light and accommodation. She has had bilateral cataract repairs. Extraocular tracking shows saccadic breakdown without nystagmus noted. There is no limitation to her upgaze. There is mild decrease in eye blink rate. Hearing is intact.  Face is symmetric with mild facial masking and normal facial sensation. There is no lip, neck or jaw tremor. Neck is mildly Rigid with intact passive ROM. There are no carotid bruits on auscultation. Oropharynx exam reveals mild mouth dryness. Moderate airway crowding is noted, due to  narrow airway and thick soft palate. She has dentures in the upper and is nearly edentulous in the lower. She  Mallampati is class II. Tongue protrudes centrally and palate elevates symmetrically. There is no drooling. Speech is moderately hypophonic.  Chest: is clear to auscultation without wheezing, rhonchi or crackles noted.  Heart: sounds are regular and normal without murmurs, rubs or gallops noted.   Abdomen: is soft, non-tender and non-distended with normal bowel sounds appreciated on auscultation.  Extremities: There is trace edema in the distal lower extremities bilaterally, around the ankles only, R more than L.   Skin: is warm and dry with no trophic changes noted. Age-related changes are noted on the skin.   Musculoskeletal: exam reveals no obvious joint deformities, tenderness, joint swelling or erythema.  Neurologically:   Mental status: The patient is awake and alert, paying good  attention. She is able to completely provide the history. She is oriented to: person, place, time/date, situation, day of week, month of year and year. Her memory, attention, language and knowledge are  mildly impaired. There is evidence of bradyphrenia. Speech is mildly hypophonic with no dysarthria noted. Mood is congruent and affect is near tears today.   Cranial nerves are as described above under HEENT exam. In addition, shoulder shrug is normal with equal shoulder height noted.  Motor exam:  thinner bulk, global strength of 4 out of 5, she has no significant dyskinesias. She has an intermittent resting tremor on the left side and more consistent tremor on the right side in the upper extremities, slight  tremors in the lower extremities intermittently. She has moderate bradykinesia. She has moderate impairment of fine motor skills and right and mild to moderate on the left. She stands with significant difficulty, Romberg is not testable safely. Sensory exam is intact to light touch, reflexes are 1-2+. She has impaired balance, posture is moderately stooped, she requires several attempts to stand but requires no assistance. She walks slowly and cautiously with a 4 prong cane.   Assessment and plan:  In summary, Brittany Crosby is a very pleasant 76 y.o.-year old female with an underlying complex medical history of lower back pain with history of herniated disc, hyperlipidemia, glaucoma, cataracts, hiatal hernia, depression, anxiety, hypertension, osteopenia, emphysema and COPD in the context of prior smoking (quit some 4 years ago), status post tonsillectomy, appendectomy, cholecystectomy, bladder surgery and abdominal hysterectomy, who presents for follow-up consultation of her parkinsonism, with hx and exam in keeping with right-sided predominant Parkinson's disease. Her symptoms date back about 4 years ago and started with right-sided tremors, handwriting difficulties, fine motor difficulties and gait changes as well as stiffness. She has been on Sinemet, which is currently still at 1 pill 3 times a day, tolerating this well.  She has had more balance difficulties and recent falls. She has a tendency to fall up her a few steps that she has in the house. She has lost weight, physical exam shows progression of disease. She has had some recent stressors and is more tearful, his residual depression and also anxiety. She also worries about the future. All her children are in [place] and she would like to consider being closer to them. Today, I suggested she start using a 2 wheeled walker. I prescribed this for her. I would like to increase her Sinemet to 1 pill 4 times a day, at 8, 12, 4 PM and 8 PM.  Furthermore, would like to phase her out of gabapentin which we originally started for  tremor control but we should try to streamline her medication and also avoid any additional medications that could exacerbate balance. To that end, I suggested she reduce it to 200 mg daily. To change her prescription from 300 mg strength capsules to 100 mg strength pills. We will furthermore try to reduce this to 100 mg once daily at the next visit. I suggested a three-month checkup with one of our nurse practitioners. She has had mild memory loss and we will continue to monitor. She is advised to talk to her PCP about her residual depression and anxiety. She is reminded to stay well hydrated with water and eat at regular intervals, supplement with Ensure or Boost. I answered all her questions today and the patient was in agreement with the plan.  I spent 30 minutes in total face-to-face time with the patient, more than 50% of which was spent in counseling and coordination of care, reviewing test results, reviewing medication and discussing or reviewing the diagnosis of PD, its prognosis and treatment options. Pertinent laboratory and imaging test results that were available during this visit with the patient were reviewed by me and considered in my medical decision making (see chart for details).

## 2016-09-15 NOTE — Progress Notes (Signed)
Order for walker sent to [facility].

## 2016-10-05 DIAGNOSIS — H401131 Primary open-angle glaucoma, bilateral, mild stage: Secondary | ICD-10-CM | POA: Diagnosis not present

## 2016-10-08 DIAGNOSIS — E039 Hypothyroidism, unspecified: Secondary | ICD-10-CM | POA: Diagnosis not present

## 2016-10-08 DIAGNOSIS — J449 Chronic obstructive pulmonary disease, unspecified: Secondary | ICD-10-CM | POA: Diagnosis not present

## 2016-10-08 DIAGNOSIS — F329 Major depressive disorder, single episode, unspecified: Secondary | ICD-10-CM | POA: Diagnosis not present

## 2016-10-08 DIAGNOSIS — R1310 Dysphagia, unspecified: Secondary | ICD-10-CM | POA: Diagnosis not present

## 2016-10-13 ENCOUNTER — Other Ambulatory Visit: Payer: Self-pay | Admitting: Family Medicine

## 2016-10-13 DIAGNOSIS — R1310 Dysphagia, unspecified: Secondary | ICD-10-CM

## 2016-11-01 ENCOUNTER — Ambulatory Visit
Admission: RE | Admit: 2016-11-01 | Discharge: 2016-11-01 | Disposition: A | Discharge status: Home / Self Care | Payer: Medicare Other | Source: Ambulatory Visit | Attending: Family Medicine | Admitting: Family Medicine

## 2016-11-01 ENCOUNTER — Other Ambulatory Visit: Payer: Self-pay | Admitting: Family Medicine

## 2016-11-01 DIAGNOSIS — R1310 Dysphagia, unspecified: Secondary | ICD-10-CM

## 2016-11-01 DIAGNOSIS — K449 Diaphragmatic hernia without obstruction or gangrene: Secondary | ICD-10-CM | POA: Diagnosis not present

## 2016-11-09 DIAGNOSIS — R1310 Dysphagia, unspecified: Secondary | ICD-10-CM | POA: Diagnosis not present

## 2016-11-09 DIAGNOSIS — R490 Dysphonia: Secondary | ICD-10-CM | POA: Diagnosis not present

## 2016-11-10 ENCOUNTER — Encounter: Payer: Self-pay | Admitting: Pulmonary Disease

## 2016-11-10 ENCOUNTER — Ambulatory Visit (INDEPENDENT_AMBULATORY_CARE_PROVIDER_SITE_OTHER): Payer: Medicare Other | Admitting: Pulmonary Disease

## 2016-11-10 DIAGNOSIS — J449 Chronic obstructive pulmonary disease, unspecified: Secondary | ICD-10-CM

## 2016-11-10 DIAGNOSIS — Z720 Tobacco use: Secondary | ICD-10-CM | POA: Diagnosis not present

## 2016-11-10 NOTE — Assessment & Plan Note (Signed)
This is been a stable interval for her. She has not had an exacerbation in her symptoms are stable. She uses her rescue inhaler on a minimal basis. She is compliant with her controller medicine.  Her immunizations are up-to-date.  Plan: I encouraged her to stay active Continue Symbicort Continue albuterol as needed Follow-up in one year or sooner if needed

## 2016-11-10 NOTE — Patient Instructions (Addendum)
Call us if you haven't heard from the lung cancer screening program by late July Keep taking Symbicort as you are doing Get a flu shot in th efall We will see you back in 1 year or sooner if needed

## 2016-11-10 NOTE — Assessment & Plan Note (Signed)
Continue plans for lung cancer screening this year

## 2016-11-10 NOTE — Progress Notes (Signed)
Subjective:    Patient ID: Brittany Crosby, female    DOB: 08-Dec-1940, 76 y.o.   MRN: [id number]  Synopsis: GOLD Grade C COPD, severe airflow obstruction, has gluacoma  HPI Chief Complaint  Patient presents with  . Follow-up    pt doing well, does note difficulty breathing in hot and cold temperatures.     Brittany Crosby says she has been stable since the last visit. She says that her breathing has been stable. No flu this year.  She  Is trying to stay active with walking.  She walks her dogs daily and walks at least a block at a time.  She has used a walker in the house to help her with balance.  She has occassional wheezing and ches tightness.  She is still taking symbicort.  No tobacco abuse.   Past Medical History:  Diagnosis Date  . Anxiety   . Colon polyps   . COPD (chronic obstructive pulmonary disease) ([facility])   . Disorder of bone and cartilage, unspecified   . Glaucoma   . Glaucoma    unspecified  . H/O falling   . Hiatal hernia   . Hypercholesteremia   . Major depressive disorder, single episode, unspecified   . Nicotine dependence    history  . Thyroid disease hypothyroidism     Review of Systems  Constitutional: Positive for fatigue. Negative for chills and fever.  HENT: Negative for postnasal drip, rhinorrhea and sinus pain.   Respiratory: Positive for shortness of breath. Negative for cough and wheezing.   Cardiovascular: Negative for chest pain, palpitations and leg swelling.       Objective:   Physical Exam Vitals:   11/10/16 0927  BP: 106/68  Pulse: 87  SpO2: 100%  Weight: 135 lb (61.2 kg)  Height: 5\' 4"  (1.626 m)  RA  Gen: well appearing HENT: OP clear, TM's clear, neck supple PULM: CTA B, normal percussion CV: RRR, no mgr, trace edema GI: BS+, soft, nontender Derm: no cyanosis or rash Psyche: normal mood and affect   PFT: 10/2013 PFT> Ratio 55%, FEV1 1.02L (49% pred, 17% change but < 200cc), TLC 4.08L (85% pred), DLCO 15.46 (71% pred)       Assessment & Plan:   Tobacco abuse Continue plans for lung cancer screening this year  COPD, severe ([facility]) This is been a stable interval for her. She has not had an exacerbation in her symptoms are stable. She uses her rescue inhaler on a minimal basis. She is compliant with her controller medicine.  Her immunizations are up-to-date.  Plan: I encouraged her to stay active Continue Symbicort Continue albuterol as needed Follow-up in one year or sooner if needed   Updated Medication List Outpatient Encounter Prescriptions as of 11/10/2016  Medication Sig  . aspirin EC 81 MG tablet Take 81 mg by mouth daily.  . budesonide-formoterol (SYMBICORT) 160-4.5 MCG/ACT inhaler Inhale 2 puffs into the lungs 2 (two) times daily.  . carbidopa-levodopa (SINEMET IR) 25-100 MG tablet Take 1 tablet by mouth 4 (four) times daily. At 8 AM, 12, 4 PM and 8 PM  . gabapentin (NEURONTIN) 100 MG capsule Take 2 capsules (200 mg total) by mouth at bedtime.  Marland Kitchen. latanoprost (XALATAN) 0.005 % ophthalmic solution Place 1 drop into both eyes at bedtime.  Marland Kitchen. levothyroxine (SYNTHROID, LEVOTHROID) 88 MCG tablet Take 88 mcg by mouth daily.  . Multiple Vitamins-Minerals (ONE-A-DAY WOMENS 50+ ADVANTAGE PO) Take 1 tablet by mouth daily.  . NON FORMULARY 2lpm at bedtime.  .Marland Kitchen  PROAIR HFA 108 (90 BASE) MCG/ACT inhaler   . Spacer/Aero-Holding Chambers (AEROCHAMBER MV) inhaler Use as instructed  . citalopram (CELEXA) 40 MG tablet Take 40 mg by mouth daily.   No facility-administered encounter medications on file as of 11/10/2016.

## 2016-11-11 DIAGNOSIS — R1310 Dysphagia, unspecified: Secondary | ICD-10-CM | POA: Diagnosis not present

## 2016-11-11 DIAGNOSIS — K449 Diaphragmatic hernia without obstruction or gangrene: Secondary | ICD-10-CM | POA: Diagnosis not present

## 2016-11-11 DIAGNOSIS — K3189 Other diseases of stomach and duodenum: Secondary | ICD-10-CM | POA: Diagnosis not present

## 2016-12-23 ENCOUNTER — Other Ambulatory Visit (HOSPITAL_COMMUNITY): Payer: Self-pay | Admitting: Gastroenterology

## 2016-12-23 DIAGNOSIS — R1310 Dysphagia, unspecified: Secondary | ICD-10-CM

## 2016-12-23 DIAGNOSIS — R490 Dysphonia: Secondary | ICD-10-CM | POA: Diagnosis not present

## 2016-12-28 ENCOUNTER — Ambulatory Visit (HOSPITAL_COMMUNITY)
Admission: RE | Admit: 2016-12-28 | Discharge: 2016-12-28 | Disposition: A | Discharge status: Home / Self Care | Payer: Medicare Other | Source: Ambulatory Visit | Attending: Gastroenterology | Admitting: Gastroenterology

## 2016-12-28 DIAGNOSIS — R490 Dysphonia: Secondary | ICD-10-CM | POA: Diagnosis not present

## 2016-12-28 DIAGNOSIS — R1310 Dysphagia, unspecified: Secondary | ICD-10-CM | POA: Insufficient documentation

## 2016-12-28 NOTE — Progress Notes (Signed)
Modified Barium Swallow Progress Note  Patient Details  Name: Brittany Crosby MRN: [id number] Date of Birth: July 29, 1940  Today's Date: 12/28/2016  Modified Barium Swallow completed.  Full report located under Chart Review in the Imaging Section.  Brief recommendations include the following:  Clinical Impression  Pt presents with a functional oropharyngeal swallow with no aspiration and no residuals post-swallow, even when pt subjectively reported feeling like some of the solid food was "stuck". Pt did have a tendency to take very small bites/sips and played with them in the anterior portion of her mouth before swallowing, but this appeared to be more volitional in nature given that she felt worried about swallowing. Suspect that her globus sensation may be related to her esophageal issues. Recommend to continue up to regular textures and thin liquids as tolerated. Esophageal precautions were provided.   Swallow Evaluation Recommendations       SLP Diet Recommendations: Regular solids;Thin liquid   Liquid Administration via: Cup;Straw   Medication Administration: Whole meds with liquid   Supervision: Patient able to self feed   Compensations: Slow rate;Small sips/bites;Follow solids with liquid   Postural Changes: Remain semi-upright after after feeds/meals (Comment);Seated upright at 90 degrees   Oral Care Recommendations: Oral care BID        Brittany Crosby, Brittany Crosby 12/28/2016,1:08 PM   Brittany Crosby, M.A. CCC-SLP [phone]

## 2016-12-30 ENCOUNTER — Encounter: Payer: Self-pay | Admitting: Adult Health

## 2016-12-30 ENCOUNTER — Ambulatory Visit (INDEPENDENT_AMBULATORY_CARE_PROVIDER_SITE_OTHER): Payer: Medicare Other | Admitting: Adult Health

## 2016-12-30 VITALS — BP 127/66 | HR 72 | Ht 64.0 in | Wt 128.6 lb

## 2016-12-30 DIAGNOSIS — R413 Other amnesia: Secondary | ICD-10-CM | POA: Diagnosis not present

## 2016-12-30 DIAGNOSIS — R269 Unspecified abnormalities of gait and mobility: Secondary | ICD-10-CM | POA: Diagnosis not present

## 2016-12-30 DIAGNOSIS — G20 Parkinson's disease: Secondary | ICD-10-CM

## 2016-12-30 NOTE — Patient Instructions (Signed)
Continue Sinemet  Decrease gabapentin to 100 mg for 3-4 weeks then stop medication Referral for physical therapy If your symptoms worsen or you develop new symptoms please let us know.

## 2016-12-30 NOTE — Progress Notes (Addendum)
PATIENT: Brittany Crosby DOB: 1941/03/03  REASON FOR VISIT: follow up- Parkinson's disease HISTORY FROM: patient  HISTORY OF PRESENT ILLNESS: Today 12/30/16 Brittany Crosby is a 76 year old female with a history of Parkinson's disease. She returns today for follow-up. At the last visit her Sinemet dose was increased to 1 tablet 4 times a day. She was also advised to slowly wean down gabapentin dose to 200 mg daily. Patient reports that she feels that her symptoms have remained stable. She continues to have trouble with her gait and balance. She has not had any recent falls. She did receive the walker but does not know how to use it appropriately. She states that when she initially increase her Sinemet dosing did have some dizziness however that has improved and she now only has mild dizziness. Reports that she recently had a modified barium swallow that was unremarkable. She denies any trouble sleeping. She notes that she does have some trouble with her memory particularly the short-term. She reports that she tends to do better in regards to the memory when she is in her home. She returns today for evaluation.   HISTORY Per Dr. Teofilo Pod notes: Brittany Crosby is a 76 year old right-handed woman with an underlying medical history of lower back pain with history of herniated disc, hyperlipidemia, glaucoma, cataracts, hiatal hernia, depression, anxiety, hypertension, osteopenia, and COPD in the context of prior smoking (quit some 3 years ago), status post tonsillectomy, appendectomy, cholecystectomy, bladder surgery and abdominal hysterectomy, who presents for followup consultation of her right hand tremor, with evidence of parkinsonism. The patient is unaccompanied today. I last saw her on 02/26/2016, at which time she reported doing okay, had no recent falls, was using a cane for outside. She moved into a new home, ranch style. She was followed by pulmonology. She was worried about her sons in  [place]. I suggested she continue with Sinemet 1 pill 3 times a day.    09/14/2016 She reports she has fallen at least 2 times, has to climb 3 stairs to get in the house and 3 steps down to laundry room. Sore L shoulder, but no other injuries. appetite lower, eats little and slowly. Has lost weight, drinks Ensure each night. Short term memory worse, stress more. Lost dog to cancer. Son had burst appendix, while at [facility]. Kids are in [place]. Theodoro Grist has 2 kids in [place], but he would like to move to [facility] post retirement.   The patient's allergies, current medications, family history, past medical history, past social history, past surgical history and problem list were reviewed and updated as appropriate.    REVIEW OF SYSTEMS: Out of a complete 14 system review of symptoms, the patient complains only of the following symptoms, and all other reviewed systems are negative.  Cold intolerance, shortness of breath, unexpected weight change, daytime sleepiness, walking difficulty, memory loss, dizziness, speech difficulty, weakness, tremors  ALLERGIES: Allergies  Allergen Reactions  . Codeine Other (See Comments)    Abdominal pain  . Sulfa Antibiotics Swelling  . Penicillins Rash    HOME MEDICATIONS: Outpatient Medications Prior to Visit  Medication Sig Dispense Refill  . aspirin EC 81 MG tablet Take 81 mg by mouth daily.    . budesonide-formoterol (SYMBICORT) 160-4.5 MCG/ACT inhaler Inhale 2 puffs into the lungs 2 (two) times daily. 3 Inhaler 1  . carbidopa-levodopa (SINEMET IR) 25-100 MG tablet Take 1 tablet by mouth 4 (four) times daily. At 8 AM, 12, 4 PM and 8 PM 360 tablet 3  .  citalopram (CELEXA) 40 MG tablet Take 40 mg by mouth daily.  3  . gabapentin (NEURONTIN) 100 MG capsule Take 2 capsules (200 mg total) by mouth at bedtime. 60 capsule 3  . latanoprost (XALATAN) 0.005 % ophthalmic solution Place 1 drop into both eyes at bedtime.    Marland Kitchen. levothyroxine (SYNTHROID, LEVOTHROID) 88 MCG tablet  Take 88 mcg by mouth daily.    . Multiple Vitamins-Minerals (ONE-A-DAY WOMENS 50+ ADVANTAGE PO) Take 1 tablet by mouth daily.    . NON FORMULARY 2lpm at bedtime.    Marland Kitchen. PROAIR HFA 108 (90 BASE) MCG/ACT inhaler   2  . Spacer/Aero-Holding Chambers (AEROCHAMBER MV) inhaler Use as instructed 1 each 0   No facility-administered medications prior to visit.     PAST MEDICAL HISTORY: Past Medical History:  Diagnosis Date  . Anxiety   . Colon polyps   . COPD (chronic obstructive pulmonary disease) (HCC)   . Disorder of bone and cartilage, unspecified   . Glaucoma   . Glaucoma    unspecified  . H/O falling   . Hiatal hernia   . Hypercholesteremia   . Major depressive disorder, single episode, unspecified   . Nicotine dependence    history  . Thyroid disease hypothyroidism    PAST SURGICAL HISTORY: Past Surgical History:  Procedure Laterality Date  . ABDOMINAL HYSTERECTOMY    . APPENDECTOMY    . BACK SURGERY    . BLADDER AUGMENTATION    . CHOLECYSTECTOMY    . TONSILLECTOMY      FAMILY HISTORY: Family History  Problem Relation Age of Onset  . Dementia Mother   . Heart attack Father     SOCIAL HISTORY: Social History   Social History  . Marital status: Single    Spouse name: Theodoro GristDave  . Number of children: 4  . Years of education: college   Occupational History  . retired    Social History Main Topics  . Smoking status: Former Smoker    Packs/day: 1.25    Years: 30.00    Types: Cigarettes    Quit date: 11/22/2011  . Smokeless tobacco: Never Used     Comment: Remains smoke free  . Alcohol use No  . Drug use: No  . Sexual activity: Yes    Birth control/ protection: Surgical   Other Topics Concern  . Not on file   Social History Narrative   Patient is right handed, resides with signficant other in a home      PHYSICAL EXAM  Vitals:   12/30/16 0812  BP: 127/66  Pulse: 72  Weight: 128 lb 9.6 oz (58.3 kg)  Height: 5\' 4"  (1.626 m)   Body mass index is  22.07 kg/m. MMSE - Mini Mental State Exam 12/30/2016  Orientation to time 2  Orientation to Place 5  Registration 3  Attention/ Calculation 5  Recall 3  Language- name 2 objects 2  Language- repeat 0  Language- follow 3 step command 3  Language- read & follow direction 1  Write a sentence 1  Copy design 0  Total score 25    Generalized: Well developed, in no acute distress   Neurological examination  Mentation: Alert. Follows all commands speech and language fluent Cranial nerve II-XII: Pupils were equal round reactive to light. Extraocular movements were full, visual field were full on confrontational test. Facial sensation and strength were normal. Uvula tongue midline. Head turning and shoulder shrug  were normal and symmetric. Motor: The motor testing reveals 5 over  5 strength of all 4 extremities. Good symmetric motor tone is noted throughout. Moderate impairment of finger taps on the right. Mod- severe impairment on the left.  Sensory: Sensory testing is intact to soft touch on all 4 extremities. No evidence of extinction is noted.  Coordination: Cerebellar testing reveals good finger-nose-finger and heel-to-shin bilaterally.  Gait and station: Patient is unable to stand from a seated position without assistance. Her gait is very slow and cautious. Decrease stride. Minimal arm swing. She is using a cane when ambulating. Reflexes: Deep tendon reflexes are symmetric and normal bilaterally.   DIAGNOSTIC DATA (LABS, IMAGING, TESTING) - I reviewed patient records, labs, notes, testing and imaging myself where available.  Lab Results  Component Value Date   WBC 8.3 04/22/2016   HGB 12.7 04/22/2016   HCT 37.8 04/22/2016   MCV 91.2 04/22/2016   PLT 228.0 04/22/2016      Component Value Date/Time   NA 138 04/01/2012 2205   K 3.5 04/01/2012 2205   CL 104 04/01/2012 2205   CO2 26 04/01/2012 2205   GLUCOSE 110 (H) 04/01/2012 2205   BUN 18 04/01/2012 2205   CREATININE 0.54  04/01/2012 2205   CALCIUM 9.0 04/01/2012 2205   PROT 6.6 04/01/2012 2205   ALBUMIN 3.6 04/01/2012 2205   AST 18 04/01/2012 2205   ALT 12 04/01/2012 2205   ALKPHOS 76 04/01/2012 2205   BILITOT 0.2 (L) 04/01/2012 2205   GFRNONAA >90 04/01/2012 2205   GFRAA >90 04/01/2012 2205       ASSESSMENT AND PLAN 76 y.o. year old female  has a past medical history of Anxiety; Colon polyps; COPD (chronic obstructive pulmonary disease) (HCC); Disorder of bone and cartilage, unspecified; Glaucoma; Glaucoma; H/O falling; Hiatal hernia; Hypercholesteremia; Major depressive disorder, single episode, unspecified; Nicotine dependence; and Thyroid disease (hypothyroidism). here with:  1. Parkinson's disease 2. Abnormality of gait 3. Memory disturbance   The patient has overall remained stable. She continues to have trouble with her gait and balance. She does have the walker but is unable to use it appropriatly. I will send her to physical therapy for gait and balance training. She will continue on Sinemet one tablet 4 times a day. She will decrease gabapentin to 100 mg daily for 3-4 weeks and then discontinue the medication. We tested the patient's memory today her MMSE was 25 out of 30. We will continue to monitor. We will retest at the next visit if there is a decline we will consider medication at that time. If the patient's symptoms worsen or she develops new symptoms she should let us know. She will follow-up in 6 months or sooner if needed.      Butch Penny, MSN, NP-C 12/30/2016, 8:30 AM [facility] [address] [place], [place] [address] [phone]  I reviewed the above note and documentation by the Nurse Practitioner and agree with the history, physical exam, assessment and plan as outlined above. I was immediately available for face-to-face consultation. Huston Foley, MD, PhD [facility])

## 2017-01-03 DIAGNOSIS — R2681 Unsteadiness on feet: Secondary | ICD-10-CM | POA: Diagnosis not present

## 2017-01-03 DIAGNOSIS — F0280 Dementia in other diseases classified elsewhere without behavioral disturbance: Secondary | ICD-10-CM | POA: Diagnosis not present

## 2017-01-03 DIAGNOSIS — G20 Parkinson's disease: Secondary | ICD-10-CM | POA: Diagnosis not present

## 2017-01-03 DIAGNOSIS — J449 Chronic obstructive pulmonary disease, unspecified: Secondary | ICD-10-CM | POA: Diagnosis not present

## 2017-01-05 ENCOUNTER — Emergency Department (HOSPITAL_COMMUNITY): Payer: Medicare Other

## 2017-01-05 ENCOUNTER — Encounter (HOSPITAL_COMMUNITY): Payer: Self-pay | Admitting: Emergency Medicine

## 2017-01-05 ENCOUNTER — Inpatient Hospital Stay (HOSPITAL_COMMUNITY)
Admission: EM | Admit: 2017-01-05 | Discharge: 2017-01-09 | DRG: 871 | Disposition: A | Discharge status: Home / Self Care | Payer: Medicare Other | Attending: Internal Medicine | Admitting: Internal Medicine

## 2017-01-05 DIAGNOSIS — N281 Cyst of kidney, acquired: Secondary | ICD-10-CM | POA: Diagnosis present

## 2017-01-05 DIAGNOSIS — R197 Diarrhea, unspecified: Secondary | ICD-10-CM | POA: Diagnosis not present

## 2017-01-05 DIAGNOSIS — A09 Infectious gastroenteritis and colitis, unspecified: Secondary | ICD-10-CM | POA: Diagnosis not present

## 2017-01-05 DIAGNOSIS — R402411 Glasgow coma scale score 13-15, in the field [EMT or ambulance]: Secondary | ICD-10-CM | POA: Diagnosis not present

## 2017-01-05 DIAGNOSIS — G20 Parkinson's disease: Secondary | ICD-10-CM | POA: Diagnosis present

## 2017-01-05 DIAGNOSIS — J449 Chronic obstructive pulmonary disease, unspecified: Secondary | ICD-10-CM | POA: Diagnosis present

## 2017-01-05 DIAGNOSIS — K529 Noninfective gastroenteritis and colitis, unspecified: Secondary | ICD-10-CM | POA: Diagnosis not present

## 2017-01-05 DIAGNOSIS — F329 Major depressive disorder, single episode, unspecified: Secondary | ICD-10-CM | POA: Diagnosis present

## 2017-01-05 DIAGNOSIS — Z7982 Long term (current) use of aspirin: Secondary | ICD-10-CM

## 2017-01-05 DIAGNOSIS — Z882 Allergy status to sulfonamides status: Secondary | ICD-10-CM

## 2017-01-05 DIAGNOSIS — Z880 Allergy status to penicillin: Secondary | ICD-10-CM

## 2017-01-05 DIAGNOSIS — A419 Sepsis, unspecified organism: Principal | ICD-10-CM | POA: Diagnosis present

## 2017-01-05 DIAGNOSIS — Z87891 Personal history of nicotine dependence: Secondary | ICD-10-CM

## 2017-01-05 DIAGNOSIS — F0390 Unspecified dementia without behavioral disturbance: Secondary | ICD-10-CM | POA: Diagnosis present

## 2017-01-05 DIAGNOSIS — F0281 Dementia in other diseases classified elsewhere with behavioral disturbance: Secondary | ICD-10-CM | POA: Diagnosis present

## 2017-01-05 DIAGNOSIS — Z66 Do not resuscitate: Secondary | ICD-10-CM | POA: Diagnosis present

## 2017-01-05 DIAGNOSIS — Z885 Allergy status to narcotic agent status: Secondary | ICD-10-CM

## 2017-01-05 DIAGNOSIS — R9431 Abnormal electrocardiogram [ECG] [EKG]: Secondary | ICD-10-CM | POA: Diagnosis not present

## 2017-01-05 DIAGNOSIS — G9341 Metabolic encephalopathy: Secondary | ICD-10-CM | POA: Diagnosis present

## 2017-01-05 DIAGNOSIS — E876 Hypokalemia: Secondary | ICD-10-CM | POA: Diagnosis present

## 2017-01-05 DIAGNOSIS — G20A1 Parkinson's disease without dyskinesia, without mention of fluctuations: Secondary | ICD-10-CM | POA: Diagnosis present

## 2017-01-05 DIAGNOSIS — E039 Hypothyroidism, unspecified: Secondary | ICD-10-CM | POA: Diagnosis present

## 2017-01-05 DIAGNOSIS — Z7951 Long term (current) use of inhaled steroids: Secondary | ICD-10-CM

## 2017-01-05 DIAGNOSIS — F419 Anxiety disorder, unspecified: Secondary | ICD-10-CM | POA: Diagnosis present

## 2017-01-05 DIAGNOSIS — I10 Essential (primary) hypertension: Secondary | ICD-10-CM | POA: Diagnosis present

## 2017-01-05 DIAGNOSIS — H409 Unspecified glaucoma: Secondary | ICD-10-CM | POA: Diagnosis present

## 2017-01-05 HISTORY — DX: Dependence on supplemental oxygen: Z99.81

## 2017-01-05 LAB — URINALYSIS, ROUTINE W REFLEX MICROSCOPIC
BACTERIA UA: NONE SEEN
BILIRUBIN URINE: NEGATIVE
Glucose, UA: NEGATIVE mg/dL
Ketones, ur: 20 mg/dL — AB
LEUKOCYTES UA: NEGATIVE
NITRITE: NEGATIVE
PROTEIN: 30 mg/dL — AB
Specific Gravity, Urine: 1.02 (ref 1.005–1.030)
pH: 5 (ref 5.0–8.0)

## 2017-01-05 LAB — CBC WITH DIFFERENTIAL/PLATELET
BASOS ABS: 0 10*3/uL (ref 0.0–0.1)
BASOS PCT: 0 %
Eosinophils Absolute: 0 10*3/uL (ref 0.0–0.7)
Eosinophils Relative: 0 %
HEMATOCRIT: 36.7 % (ref 36.0–46.0)
HEMOGLOBIN: 12.1 g/dL (ref 12.0–15.0)
LYMPHS PCT: 8 %
Lymphs Abs: 1.4 10*3/uL (ref 0.7–4.0)
MCH: 30.9 pg (ref 26.0–34.0)
MCHC: 33 g/dL (ref 30.0–36.0)
MCV: 93.6 fL (ref 78.0–100.0)
MONOS PCT: 5 %
Monocytes Absolute: 0.9 10*3/uL (ref 0.1–1.0)
NEUTROS ABS: 14.9 10*3/uL — AB (ref 1.7–7.7)
NEUTROS PCT: 87 %
Platelets: 214 10*3/uL (ref 150–400)
RBC: 3.92 MIL/uL (ref 3.87–5.11)
RDW: 13 % (ref 11.5–15.5)
WBC: 17.2 10*3/uL — ABNORMAL HIGH (ref 4.0–10.5)

## 2017-01-05 LAB — COMPREHENSIVE METABOLIC PANEL
ALBUMIN: 3.5 g/dL (ref 3.5–5.0)
ALK PHOS: 82 U/L (ref 38–126)
ALT: <5 U/L — ABNORMAL LOW (ref 14–54)
ANION GAP: 9 (ref 5–15)
AST: 18 U/L (ref 15–41)
BUN: 22 mg/dL — ABNORMAL HIGH (ref 6–20)
CALCIUM: 9 mg/dL (ref 8.9–10.3)
CO2: 25 mmol/L (ref 22–32)
Chloride: 104 mmol/L (ref 101–111)
Creatinine, Ser: 0.74 mg/dL (ref 0.44–1.00)
GFR calc non Af Amer: >60 mL/min (ref >60)
GLUCOSE: 125 mg/dL — AB (ref 65–99)
POTASSIUM: 3.3 mmol/L — AB (ref 3.5–5.1)
SODIUM: 138 mmol/L (ref 135–145)
Total Bilirubin: 0.9 mg/dL (ref 0.3–1.2)
Total Protein: 6.4 g/dL — ABNORMAL LOW (ref 6.5–8.1)

## 2017-01-05 LAB — POC OCCULT BLOOD, ED: FECAL OCCULT BLD: NEGATIVE

## 2017-01-05 LAB — I-STAT CG4 LACTIC ACID, ED: LACTIC ACID, VENOUS: 1.06 mmol/L (ref 0.5–1.9)

## 2017-01-05 MED ORDER — LEVOFLOXACIN IN D5W 750 MG/150ML IV SOLN
750.0000 mg | INTRAVENOUS | Status: DC
  Administered 2017-01-06 – 2017-01-07 (×2): 750 mg via INTRAVENOUS
  Filled 2017-01-05 (×2): qty 150

## 2017-01-05 MED ORDER — METRONIDAZOLE IN NACL 5-0.79 MG/ML-% IV SOLN: 500.0000 mg | Freq: Three times a day (TID) | INTRAVENOUS | Status: DC

## 2017-01-05 MED ORDER — METRONIDAZOLE IN NACL 5-0.79 MG/ML-% IV SOLN
500.0000 mg | Freq: Once | INTRAVENOUS | Status: DC
Start: 2017-01-05 — End: 2017-01-05

## 2017-01-05 MED ORDER — IOPAMIDOL (ISOVUE-300) INJECTION 61%
INTRAVENOUS | Status: AC
  Administered 2017-01-05: 100 mL
  Filled 2017-01-05: qty 100

## 2017-01-05 MED ORDER — CIPROFLOXACIN IN D5W 400 MG/200ML IV SOLN: 400.0000 mg | Freq: Once | INTRAVENOUS | Status: DC

## 2017-01-05 MED ORDER — LEVOFLOXACIN IN D5W 750 MG/150ML IV SOLN
750.0000 mg | Freq: Once | INTRAVENOUS | Status: AC
  Administered 2017-01-05: 750 mg via INTRAVENOUS
  Filled 2017-01-05: qty 150

## 2017-01-05 MED ORDER — ACETAMINOPHEN 650 MG RE SUPP
650.0000 mg | Freq: Once | RECTAL | Status: AC
  Administered 2017-01-05: 650 mg via RECTAL
  Filled 2017-01-05: qty 1

## 2017-01-05 MED ORDER — METRONIDAZOLE IN NACL 5-0.79 MG/ML-% IV SOLN
500.0000 mg | Freq: Once | INTRAVENOUS | Status: AC
  Administered 2017-01-05: 500 mg via INTRAVENOUS
  Filled 2017-01-05: qty 100

## 2017-01-05 MED ORDER — SODIUM CHLORIDE 0.9 % IV SOLN
INTRAVENOUS | Status: DC
  Administered 2017-01-05: via INTRAVENOUS

## 2017-01-05 NOTE — Progress Notes (Signed)
Pharmacy Antibiotic Note  Brittany Crosby is a 76 y.o. female admitted on 01/05/2017 with intra-abdominal infection.  Pharmacy has been consulted for levaquin and flagyl dosing. Renal function wnl.  Plan: 1) Levaquin 750mg  IV q24 2) Flagyl 500mg  IV q8  Height: 5\' 3"  (160 cm) Weight: 128 lb (58.1 kg) IBW/kg (Calculated) : 52.4  Temp (24hrs), Avg:100.3 F (37.9 C), Min:99.3 F (37.4 C), Max:101.3 F (38.5 C)   Recent Labs Lab 01/05/17 1952 01/05/17 2001  WBC 17.2*  --   CREATININE 0.74  --   LATICACIDVEN  --  1.06    Estimated Creatinine Clearance: 51 mL/min (by C-G formula based on SCr of 0.74 mg/dL).    Allergies  Allergen Reactions  . Codeine Other (See Comments)    Abdominal pain  . Sulfa Antibiotics Swelling  . Penicillins Rash    Antimicrobials this admission: 8/15 Levaquin >> 8/15 Flagyl >>  Dose adjustments this admission: n/a  Microbiology results: 8/15 urine >> 81/5 GI panel >>  Thank you for allowing pharmacy to be a part of this patient's care.  Brittany Crosby, Brittany Crosby 01/05/2017 8:52 PM

## 2017-01-05 NOTE — ED Triage Notes (Signed)
Per Ems pt's husband told ems that pt has been more altered than usual. PT has dementia. This started yesterday 01/04/17. Pt also has had diarrhea, nausea, and dizziness starting today. 91/58, HR 100, 96%, 18 RR.

## 2017-01-05 NOTE — ED Provider Notes (Signed)
[facility]-EMERGENCY DEPT Provider Note   CSN: [id number] Arrival date & time: 01/05/17  1924     History   Chief Complaint Chief Complaint  Patient presents with  . Altered Mental Status    HPI Brittany Crosby is a 76 y.o. female.  76 year old female presents with several-day history of diarrhea characterized as dark stools which is now progressed to fever 24 hours with left lower quadrant abdominal pain. Denies any cough congestion. No urinary symptoms. No prior history of diverticular disease. No recent travel history or antibiotic use. Pain is characterized as sharp and persistent. Called EMS was found be mildly hypotensive given IV fluids and transported here.      Past Medical History:  Diagnosis Date  . Anxiety   . Colon polyps   . COPD (chronic obstructive pulmonary disease) (HCC)   . Disorder of bone and cartilage, unspecified   . Glaucoma   . Glaucoma    unspecified  . H/O falling   . Hiatal hernia   . Hypercholesteremia   . Major depressive disorder, single episode, unspecified   . Nicotine dependence    history  . Thyroid disease hypothyroidism    Patient Active Problem List   Diagnosis Date Noted  . Fatigue 04/22/2016  . Nocturnal hypoxemia 04/22/2016  . COPD, severe (HCC) 01/01/2014  . Glaucoma 01/01/2014  . Tobacco abuse 01/01/2014    Past Surgical History:  Procedure Laterality Date  . ABDOMINAL HYSTERECTOMY    . APPENDECTOMY    . BACK SURGERY    . BLADDER AUGMENTATION    . CHOLECYSTECTOMY    . TONSILLECTOMY      OB History    No data available       Home Medications    Prior to Admission medications   Medication Sig Start Date End Date Taking? Authorizing Provider  aspirin EC 81 MG tablet Take 81 mg by mouth daily.    [provider]  budesonide-formoterol (SYMBICORT) 160-4.5 MCG/ACT inhaler Inhale 2 puffs into the lungs 2 (two) times daily. 03/10/16   Lupita Leash, MD  carbidopa-levodopa (SINEMET IR) 25-100 MG  tablet Take 1 tablet by mouth 4 (four) times daily. At 8 AM, 12, 4 PM and 8 PM 09/14/16   Huston Foley, MD  citalopram (CELEXA) 40 MG tablet Take 40 mg by mouth daily. 11/08/14   [provider]  gabapentin (NEURONTIN) 100 MG capsule Take 2 capsules (200 mg total) by mouth at bedtime. 09/14/16   Huston Foley, MD  latanoprost (XALATAN) 0.005 % ophthalmic solution Place 1 drop into both eyes at bedtime.    [provider]  levothyroxine (SYNTHROID, LEVOTHROID) 88 MCG tablet Take 88 mcg by mouth daily.    [provider]  Multiple Vitamins-Minerals (ONE-A-DAY WOMENS 50+ ADVANTAGE PO) Take 1 tablet by mouth daily.    [provider]  NON FORMULARY 2lpm at bedtime.    [provider]  PROAIR HFA 108 [phone] BASE) MCG/ACT inhaler  04/29/14   [provider]  Spacer/Aero-Holding Chambers (AEROCHAMBER MV) inhaler Use as instructed 01/01/14   Lupita Leash, MD    Family History Family History  Problem Relation Age of Onset  . Dementia Mother   . Heart attack Father     Social History Social History  Substance Use Topics  . Smoking status: Former Smoker    Packs/day: 1.25    Years: 30.00    Types: Cigarettes    Quit date: 11/22/2011  . Smokeless tobacco: Never Used  Comment: Remains smoke free  . Alcohol use No     Allergies   Codeine; Sulfa antibiotics; and Penicillins   Review of Systems Review of Systems  All other systems reviewed and are negative.    Physical Exam Updated Vital Signs BP (!) 115/52   Pulse 95   Temp (!) 101.3 F (38.5 C) (Rectal)   Resp (!) 22   Ht 1.6 m (5\' 3" )   Wt 58.1 kg (128 lb)   SpO2 99%   BMI 22.67 kg/m   Physical Exam  Constitutional: She is oriented to person, place, and time. She appears well-developed and well-nourished.  Non-toxic appearance. No distress.  HENT:  Head: Normocephalic and atraumatic.  Eyes: Pupils are equal, round, and reactive to light. Conjunctivae, EOM and lids are  normal.  Neck: Normal range of motion. Neck supple. No tracheal deviation present. No thyroid mass present.  Cardiovascular: Normal rate, regular rhythm and normal heart sounds.  Exam reveals no gallop.   No murmur heard. Pulmonary/Chest: Effort normal and breath sounds normal. No stridor. No respiratory distress. She has no decreased breath sounds. She has no wheezes. She has no rhonchi. She has no rales.  Abdominal: Soft. Normal appearance and bowel sounds are normal. She exhibits no distension. There is tenderness in the left lower quadrant. There is guarding. There is no rebound and no CVA tenderness.    Musculoskeletal: Normal range of motion. She exhibits no edema or tenderness.  Neurological: She is alert and oriented to person, place, and time. She has normal strength. No cranial nerve deficit or sensory deficit. GCS eye subscore is 4. GCS verbal subscore is 5. GCS motor subscore is 6.  Skin: Skin is warm and dry. No abrasion and no rash noted.  Psychiatric: She has a normal mood and affect. Her speech is normal and behavior is normal.  Nursing note and vitals reviewed.    ED Treatments / Results  Labs (all labs ordered are listed, but only abnormal results are displayed) Labs Reviewed  COMPREHENSIVE METABOLIC PANEL - Abnormal; Notable for the following:       Result Value   Potassium 3.3 (*)    Glucose, Bld 125 (*)    BUN 22 (*)    Total Protein 6.4 (*)    All other components within normal limits  CBC WITH DIFFERENTIAL/PLATELET - Abnormal; Notable for the following:    WBC 17.2 (*)    Neutro Abs 14.9 (*)    All other components within normal limits  URINE CULTURE  URINALYSIS, ROUTINE W REFLEX MICROSCOPIC  I-STAT CG4 LACTIC ACID, ED    EKG  EKG Interpretation None       Radiology Dg Chest Portable 1 View  Result Date: 01/05/2017 CLINICAL DATA:  Sepsis. EXAM: PORTABLE CHEST 1 VIEW COMPARISON:  Chest CT 09/15/2015 FINDINGS: The cardiomediastinal contours are  normal. Atherosclerosis of the aortic arch. Mild hyperinflation with biapical pleuroparenchymal scarring. Pulmonary vasculature is normal. No consolidation, pleural effusion, or pneumothorax. No acute osseous abnormalities are seen. Bones are under mineralized. Multiple overlying monitoring leads. IMPRESSION: 1. No acute abnormality. 2. Hyperinflation suggesting emphysema. Biapical pleuroparenchymal scarring. 3. Aortic atherosclerosis. Electronically Signed   By: Rubye Oaks M.D.   On: 01/05/2017 20:32    Procedures Procedures (including critical care time)  Medications Ordered in ED Medications  acetaminophen (TYLENOL) suppository 650 mg (not administered)     Initial Impression / Assessment and Plan / ED Course  I have reviewed the triage vital signs and the  nursing notes.  Pertinent labs & imaging results that were available during my care of the patient were reviewed by me and considered in my medical decision making (see chart for details).     Patient started on IV antibiotics for suspected intra-abdominal infection. CT scan shows colitis. Stools are guaiac positive. We'll admit to the hospitalist  Final Clinical Impressions(s) / ED Diagnoses   Final diagnoses:  None    New Prescriptions New Prescriptions   No medications on file     Lorre NickAllen, Ashlie Mcmenamy, MD 01/05/17 2255

## 2017-01-06 ENCOUNTER — Encounter (HOSPITAL_COMMUNITY): Payer: Self-pay | Admitting: Family Medicine

## 2017-01-06 DIAGNOSIS — G9341 Metabolic encephalopathy: Secondary | ICD-10-CM | POA: Diagnosis present

## 2017-01-06 DIAGNOSIS — G20 Parkinson's disease: Secondary | ICD-10-CM | POA: Diagnosis present

## 2017-01-06 DIAGNOSIS — N281 Cyst of kidney, acquired: Secondary | ICD-10-CM | POA: Diagnosis present

## 2017-01-06 DIAGNOSIS — Z880 Allergy status to penicillin: Secondary | ICD-10-CM | POA: Diagnosis not present

## 2017-01-06 DIAGNOSIS — K529 Noninfective gastroenteritis and colitis, unspecified: Secondary | ICD-10-CM

## 2017-01-06 DIAGNOSIS — Z7951 Long term (current) use of inhaled steroids: Secondary | ICD-10-CM | POA: Diagnosis not present

## 2017-01-06 DIAGNOSIS — A419 Sepsis, unspecified organism: Secondary | ICD-10-CM | POA: Diagnosis present

## 2017-01-06 DIAGNOSIS — E039 Hypothyroidism, unspecified: Secondary | ICD-10-CM

## 2017-01-06 DIAGNOSIS — Z882 Allergy status to sulfonamides status: Secondary | ICD-10-CM | POA: Diagnosis not present

## 2017-01-06 DIAGNOSIS — J449 Chronic obstructive pulmonary disease, unspecified: Secondary | ICD-10-CM

## 2017-01-06 DIAGNOSIS — F0281 Dementia in other diseases classified elsewhere with behavioral disturbance: Secondary | ICD-10-CM | POA: Diagnosis not present

## 2017-01-06 DIAGNOSIS — A09 Infectious gastroenteritis and colitis, unspecified: Secondary | ICD-10-CM | POA: Diagnosis present

## 2017-01-06 DIAGNOSIS — Z66 Do not resuscitate: Secondary | ICD-10-CM | POA: Diagnosis present

## 2017-01-06 DIAGNOSIS — Z7982 Long term (current) use of aspirin: Secondary | ICD-10-CM | POA: Diagnosis not present

## 2017-01-06 DIAGNOSIS — I10 Essential (primary) hypertension: Secondary | ICD-10-CM | POA: Diagnosis not present

## 2017-01-06 DIAGNOSIS — G20A1 Parkinson's disease without dyskinesia, without mention of fluctuations: Secondary | ICD-10-CM | POA: Diagnosis present

## 2017-01-06 DIAGNOSIS — F0390 Unspecified dementia without behavioral disturbance: Secondary | ICD-10-CM | POA: Diagnosis present

## 2017-01-06 DIAGNOSIS — F329 Major depressive disorder, single episode, unspecified: Secondary | ICD-10-CM | POA: Diagnosis present

## 2017-01-06 DIAGNOSIS — F419 Anxiety disorder, unspecified: Secondary | ICD-10-CM | POA: Diagnosis present

## 2017-01-06 DIAGNOSIS — H409 Unspecified glaucoma: Secondary | ICD-10-CM | POA: Diagnosis present

## 2017-01-06 DIAGNOSIS — Z87891 Personal history of nicotine dependence: Secondary | ICD-10-CM | POA: Diagnosis not present

## 2017-01-06 DIAGNOSIS — Z885 Allergy status to narcotic agent status: Secondary | ICD-10-CM | POA: Diagnosis not present

## 2017-01-06 DIAGNOSIS — E876 Hypokalemia: Secondary | ICD-10-CM | POA: Diagnosis present

## 2017-01-06 LAB — BASIC METABOLIC PANEL
ANION GAP: 9 (ref 5–15)
BUN: 17 mg/dL (ref 6–20)
CHLORIDE: 106 mmol/L (ref 101–111)
CO2: 23 mmol/L (ref 22–32)
Calcium: 8.5 mg/dL — ABNORMAL LOW (ref 8.9–10.3)
Creatinine, Ser: 0.58 mg/dL (ref 0.44–1.00)
GFR calc non Af Amer: >60 mL/min (ref >60)
Glucose, Bld: 100 mg/dL — ABNORMAL HIGH (ref 65–99)
POTASSIUM: 3.4 mmol/L — AB (ref 3.5–5.1)
SODIUM: 138 mmol/L (ref 135–145)

## 2017-01-06 LAB — TROPONIN I: Troponin I: <0.03 ng/mL (ref <0.03)

## 2017-01-06 LAB — CBC
HEMATOCRIT: 37.3 % (ref 36.0–46.0)
HEMOGLOBIN: 12.2 g/dL (ref 12.0–15.0)
MCH: 30.3 pg (ref 26.0–34.0)
MCHC: 32.7 g/dL (ref 30.0–36.0)
MCV: 92.8 fL (ref 78.0–100.0)
PLATELETS: 187 10*3/uL (ref 150–400)
RBC: 4.02 MIL/uL (ref 3.87–5.11)
RDW: 12.6 % (ref 11.5–15.5)
WBC: 12 10*3/uL — AB (ref 4.0–10.5)

## 2017-01-06 LAB — LACTIC ACID, PLASMA: Lactic Acid, Venous: 1.2 mmol/L (ref 0.5–1.9)

## 2017-01-06 LAB — TSH: TSH: 2.223 u[IU]/mL (ref 0.350–4.500)

## 2017-01-06 MED ORDER — ONDANSETRON HCL 4 MG/2ML IJ SOLN: 4.0000 mg | Freq: Four times a day (QID) | INTRAMUSCULAR | Status: DC | PRN

## 2017-01-06 MED ORDER — POTASSIUM CHLORIDE CRYS ER 20 MEQ PO TBCR: 40.0000 meq | EXTENDED_RELEASE_TABLET | Freq: Once | ORAL | Status: DC

## 2017-01-06 MED ORDER — ONDANSETRON HCL 4 MG PO TABS: 4.0000 mg | ORAL_TABLET | Freq: Four times a day (QID) | ORAL | Status: DC | PRN

## 2017-01-06 MED ORDER — ASPIRIN EC 81 MG PO TBEC
81.0000 mg | DELAYED_RELEASE_TABLET | Freq: Every day | ORAL | Status: DC
  Administered 2017-01-06 – 2017-01-09 (×4): 81 mg via ORAL
  Filled 2017-01-06 (×5): qty 1

## 2017-01-06 MED ORDER — ENOXAPARIN SODIUM 40 MG/0.4ML ~~LOC~~ SOLN
40.0000 mg | SUBCUTANEOUS | Status: DC
  Administered 2017-01-06 – 2017-01-09 (×4): 40 mg via SUBCUTANEOUS
  Filled 2017-01-06 (×4): qty 0.4

## 2017-01-06 MED ORDER — CITALOPRAM HYDROBROMIDE 40 MG PO TABS
40.0000 mg | ORAL_TABLET | Freq: Every day | ORAL | Status: DC
  Administered 2017-01-06 – 2017-01-09 (×4): 40 mg via ORAL
  Filled 2017-01-06 (×4): qty 1

## 2017-01-06 MED ORDER — MOMETASONE FURO-FORMOTEROL FUM 200-5 MCG/ACT IN AERO
2.0000 | INHALATION_SPRAY | Freq: Two times a day (BID) | RESPIRATORY_TRACT | Status: DC
  Administered 2017-01-06 – 2017-01-09 (×6): 2 via RESPIRATORY_TRACT
  Filled 2017-01-06: qty 8.8

## 2017-01-06 MED ORDER — CARBIDOPA-LEVODOPA 25-100 MG PO TABS
1.0000 | ORAL_TABLET | ORAL | Status: DC
  Administered 2017-01-06 – 2017-01-09 (×14): 1 via ORAL
  Filled 2017-01-06 (×14): qty 1

## 2017-01-06 MED ORDER — ACETAMINOPHEN 325 MG PO TABS
650.0000 mg | ORAL_TABLET | Freq: Four times a day (QID) | ORAL | Status: DC | PRN
  Administered 2017-01-06: 650 mg via ORAL
  Filled 2017-01-06 (×2): qty 2

## 2017-01-06 MED ORDER — ACETAMINOPHEN 650 MG RE SUPP: 650.0000 mg | Freq: Four times a day (QID) | RECTAL | Status: DC | PRN

## 2017-01-06 MED ORDER — ENSURE ENLIVE PO LIQD
237.0000 mL | Freq: Two times a day (BID) | ORAL | Status: DC
  Administered 2017-01-06 – 2017-01-09 (×7): 237 mL via ORAL

## 2017-01-06 MED ORDER — METRONIDAZOLE IN NACL 5-0.79 MG/ML-% IV SOLN
500.0000 mg | Freq: Three times a day (TID) | INTRAVENOUS | Status: DC
  Administered 2017-01-06 – 2017-01-08 (×7): 500 mg via INTRAVENOUS
  Filled 2017-01-06 (×7): qty 100

## 2017-01-06 MED ORDER — POTASSIUM CHLORIDE IN NACL 20-0.9 MEQ/L-% IV SOLN
INTRAVENOUS | Status: DC
  Administered 2017-01-06 – 2017-01-08 (×5): via INTRAVENOUS
  Filled 2017-01-06 (×5): qty 1000

## 2017-01-06 MED ORDER — LEVOTHYROXINE SODIUM 88 MCG PO TABS
44.0000 ug | ORAL_TABLET | ORAL | Status: DC
  Administered 2017-01-07: 44 ug via ORAL
  Filled 2017-01-06: qty 1

## 2017-01-06 MED ORDER — LEVOTHYROXINE SODIUM 88 MCG PO TABS
88.0000 ug | ORAL_TABLET | ORAL | Status: DC
  Administered 2017-01-06 – 2017-01-09 (×3): 88 ug via ORAL
  Filled 2017-01-06 (×3): qty 1

## 2017-01-06 MED ORDER — DONEPEZIL HCL 5 MG PO TABS
5.0000 mg | ORAL_TABLET | Freq: Every day | ORAL | Status: DC
  Administered 2017-01-06 – 2017-01-08 (×3): 5 mg via ORAL
  Filled 2017-01-06 (×3): qty 1

## 2017-01-06 NOTE — Progress Notes (Signed)
Pt seen and examined at bedside, admitted after midnight, please see earlier admission note. Pt with known dementia, Parkinson's C, COPD, depression, and hypothyroidism presented with altered mental status, fever and nonbloody diarrhea for 3 days.  Assessment and plan: 1. Colitis: - continue Flagyl and Levaquin for now - IVF - advance diet as pt able to tolerate   2. Metabolic encephalopathy: - Dementia in the setting of systemic inflammatory response. - more alert this AM - will need PT/OT  3. Renal cyst: - Incidental finding. - Nonemergent MRI as outpatient  4. Hypothyroidism: - cont Synthroid   5. Parkinson's: - Continue Sinemet  6. COPD: - continue BD's as needed   Debbora PrestoMAGICK-Adan Beal, MD  [facility] Pager [phone]  If 7PM-7AM, please contact night-coverage [email or web address] Password [email or web address]

## 2017-01-06 NOTE — Progress Notes (Signed)
[facility]  Patient Status: Active (receiving services up to time of hospitalization)  [facility] is providing the following services: PT  Referred for therapy but admitted to Hospital prior to start of services.  If patient discharges after hours, please call [phone].   Kizzie FurnishDonna Fellmy 01/06/2017, 11:34 AM

## 2017-01-06 NOTE — Progress Notes (Signed)
Pt admitted from ED accompanied by RN per stretcher, pt alert and oriented on arrival to the floor self introduced to pt, ID bracelet verrifed, oriented to room and pt care equipment, fall risk assessment done, pt has redness at the rt side of buttocks foam dressing applied, vital signs stable call light and phone within reach pt able to demonstrate how to use them

## 2017-01-06 NOTE — H&P (Signed)
History and Physical  Patient Name: Brittany LeversCarol Crosby     ZOX:[id number]    DOB: 1940-12-07    DOA: 01/05/2017 PCP: Sigmund HazelMiller, Lisa, MD  Patient coming from: Home  Chief Complaint: Fever      HPI: The patient is a 76 year old female past medical history significant for dementia, Parkinson's C, COPD, depression, and hypothyroidism who presents with altered mental status, fever and nonbloody diarrhea for 3 days.  The patient was in her usual state of health until 3 days when she developed first confusion, forgetfulness, then diarrhea, nausea, dizziness, and now the last 24 hours fever and abdominal pain. The abdominal pain is mild to moderate, crampy, mostly in the lower quadrants, associated with frequent loose nonbloody stools. She's had no recent antibiotic exposure. She's had no recent travel. She knows of no suspicious foods that she was exposed to. No sick contacts.  ED course: -Temp 101.29F, heart rate 102, respirations 22, blood pressure 115/52, pulse ox normal -Sodium 132, potassium 3.3, creatinine 0.74, transaminases normal, WBC 17.2 K, hemoglobin 12.1 -Lactate 1.06 -Urinalysis showed scant WBCs -Fecal occult blood testing negative -Chest x-ray clear -CT abdomen pelvis was obtained that showed colitis, no other focal findings -She was given Levaquin and Flagyl and TRH were asked to evaluate for colitis with SIRS       ROS: Review of Systems  Constitutional: Positive for fever.  Gastrointestinal: Positive for diarrhea and nausea.  Neurological: Positive for dizziness.  Psychiatric/Behavioral: Positive for memory loss.  All other systems reviewed and are negative.         Past Medical History:  Diagnosis Date  . Anxiety   . Colon polyps   . COPD (chronic obstructive pulmonary disease) (HCC)   . Disorder of bone and cartilage, unspecified   . Glaucoma   . Glaucoma    unspecified  . H/O falling   . Hiatal hernia   . Hypercholesteremia   . Major depressive  disorder, single episode, unspecified   . Nicotine dependence    history  . Thyroid disease hypothyroidism    Past Surgical History:  Procedure Laterality Date  . ABDOMINAL HYSTERECTOMY    . APPENDECTOMY    . BACK SURGERY    . BLADDER AUGMENTATION    . CHOLECYSTECTOMY    . TONSILLECTOMY      Social History: Patient lives with her husband.  The patient walks unassisted.  Nonsmoker.  Allergies  Allergen Reactions  . Codeine Other (See Comments)    Abdominal pain  . Sulfa Antibiotics Swelling  . Penicillins Rash    Family history: family history includes Dementia in her mother; Heart attack in her father.  Prior to Admission medications   Medication Sig Start Date End Date Taking? Authorizing Provider  aspirin EC 81 MG tablet Take 81 mg by mouth daily.   Yes [provider]  budesonide-formoterol (SYMBICORT) 160-4.5 MCG/ACT inhaler Inhale 2 puffs into the lungs 2 (two) times daily. 03/10/16  Yes Lupita LeashMcQuaid, Douglas B, MD  carbidopa-levodopa (SINEMET IR) 25-100 MG tablet Take 1 tablet by mouth 4 (four) times daily. At 8 AM, 12, 4 PM and 8 PM 09/14/16  Yes Huston FoleyAthar, Saima, MD  citalopram (CELEXA) 40 MG tablet Take 40 mg by mouth daily. 11/08/14  Yes [provider]  donepezil (ARICEPT) 5 MG tablet Take 5 mg by mouth at bedtime.   Yes [provider]  gabapentin (NEURONTIN) 100 MG capsule Take 2 capsules (200 mg total) by mouth at bedtime. Patient taking differently: Take 100 mg  by mouth at bedtime.  09/14/16  Yes Huston Foley, MD  latanoprost (XALATAN) 0.005 % ophthalmic solution Place 1 drop into both eyes at bedtime.   Yes [provider]  levothyroxine (SYNTHROID, LEVOTHROID) 88 MCG tablet Take 44-88 mcg by mouth daily. Take on Monday, Wednesday and Friday.  Take 88 mcg all other days of the week   Yes [provider]  Multiple Vitamins-Minerals (ONE-A-DAY WOMENS 50+ ADVANTAGE PO) Take 1 tablet by mouth daily.   Yes [provider]  omeprazole (PRILOSEC) 20 MG capsule Take 20 mg by mouth daily.   Yes [provider]  OXYGEN Inhale 2 L into the lungs at bedtime.   Yes [provider]  PROAIR HFA 108 (90 BASE) MCG/ACT inhaler Inhale 2 puffs into the lungs every 4 (four) hours as needed for wheezing or shortness of breath.  04/29/14  Yes [provider]  Spacer/Aero-Holding Chambers (AEROCHAMBER MV) inhaler Use as instructed 01/01/14  Yes Lupita Leash, MD       Physical Exam: BP (!) 109/58   Pulse 95   Temp (!) 101.3 F (38.5 C) (Rectal)   Resp 17   Ht 5\' 3"  (1.6 m)   Wt 58.1 kg (128 lb)   SpO2 98%   BMI 22.67 kg/m  General appearance: Thin elderly adult female, alert and in mild distress from malaise.   Eyes: Anicteric, conjunctiva pink, lids and lashes normal. PERRL.    ENT: No nasal deformity, discharge, epistaxis.  Hearing norma,l. OP very dry MM without lesions.  Dentures. Neck: No neck masses.  Trachea midline.  No thyromegaly/tenderness. Lymph: No cervical or supraclavicular lymphadenopathy. Skin: Hot flushed, dry.  No jaundice.  No suspicious rashes or lesions. Cardiac: Tachycardioc. regular, nl S1-S2, no murmurs appreciated.  Capillary refill is brisk.  JVP not visible.  No LE edema.  Radial and DP pulses 2+ and symmetric. Respiratory: Normal respiratory rate and rhythm.  CTAB without rales or wheezes. Abdomen: Abdomen soft.  Nonfocal TTP with guarding and no rigidity, mild rebound. No ascites, distension, hepatosplenomegaly.   MSK: No deformities or effusions.  No cyanosis or clubbing. Neuro: Cranial nerves grossly normal.  Sensation intact to light touch. Speech is fluent.  Muscle strength 5-/5 and symmetric.   Oriented to [facility], can't state what city that is in.  Knows it is 2018, not month.   Psych: Sensorium intact and responding to questions, attention normal.  Behavior appropriate.  Affect blunted.  Judgment and insight appear normal.     Labs on Admission:  I  have personally reviewed following labs and imaging studies: CBC:  Recent Labs Lab 01/05/17 1952  WBC 17.2*  NEUTROABS 14.9*  HGB 12.1  HCT 36.7  MCV 93.6  PLT 214   Basic Metabolic Panel:  Recent Labs Lab 01/05/17 1952  NA 138  K 3.3*  CL 104  CO2 25  GLUCOSE 125*  BUN 22*  CREATININE 0.74  CALCIUM 9.0   GFR: Estimated Creatinine Clearance: 51 mL/min (by C-G formula based on SCr of 0.74 mg/dL).  Liver Function Tests:  Recent Labs Lab 01/05/17 1952  AST 18  ALT <5*  ALKPHOS 82  BILITOT 0.9  PROT 6.4*  ALBUMIN 3.5   No results for input(s): LIPASE, AMYLASE in the last 168 hours. No results for input(s): AMMONIA in the last 168 hours. Coagulation Profile: No results for input(s): INR, PROTIME in the last 168 hours. Cardiac Enzymes: No results for input(s): CKTOTAL, CKMB, CKMBINDEX, TROPONINI in the  last 168 hours. BNP (last 3 results) No results for input(s): PROBNP in the last 8760 hours. HbA1C: No results for input(s): HGBA1C in the last 72 hours. CBG: No results for input(s): GLUCAP in the last 168 hours. Lipid Profile: No results for input(s): CHOL, HDL, LDLCALC, TRIG, CHOLHDL, LDLDIRECT in the last 72 hours. Thyroid Function Tests: No results for input(s): TSH, T4TOTAL, FREET4, T3FREE, THYROIDAB in the last 72 hours. Anemia Panel: No results for input(s): VITAMINB12, FOLATE, FERRITIN, TIBC, IRON, RETICCTPCT in the last 72 hours. Sepsis Labs:  Invalid input(s): PROCALCITONIN, LACTICIDVEN No results found for this or any previous visit (from the past 240 hour(s)).       Radiological Exams on Admission: Personally reviewed CT report: Ct Abdomen Pelvis W Contrast  Result Date: 01/05/2017 CLINICAL DATA:  Altered mental status, diarrhea and nausea EXAM: CT ABDOMEN AND PELVIS WITH CONTRAST TECHNIQUE: Multidetector CT imaging of the abdomen and pelvis was performed using the standard protocol following bolus administration of intravenous contrast.  CONTRAST:  ISOVUE-300 IOPAMIDOL (ISOVUE-300) INJECTION 61% COMPARISON:  CT chest 01/15/2016 FINDINGS: Lower chest: Lung bases demonstrate no acute consolidation or pleural effusion. Heart size is within normal limits Hepatobiliary: Subcentimeter hypodensity in the posterior right hepatic lobe, too small to further characterize. Surgical clips in the gallbladder fossa. Negative for biliary dilatation Pancreas: Unremarkable. No pancreatic ductal dilatation or surrounding inflammatory changes. Spleen: Normal in size without focal abnormality. Adrenals/Urinary Tract: Adrenal glands are within normal limits. Multiple subcentimeter renal hypodensities too small to further characterize. Negative for hydronephrosis. Possible indeterminate 15 mm nodule mid pole left kidney. The bladder is unremarkable. Stomach/Bowel: The stomach is nonenlarged. Appendix not clearly visible. No dilated small bowel. Colon wall thickening with surrounding inflammation involving the transverse colon, splenic flexure, descending and rectosigmoid colon. Vascular/Lymphatic: Aortic atherosclerosis. Subcentimeter retroperitoneal lymph nodes. Reproductive: Status post hysterectomy. No adnexal masses. Other: Negative for free air.  No significant ascites. Musculoskeletal: Trace anterolisthesis of L4 on L5. Multilevel degenerative changes. IMPRESSION: 1. Colon wall thickening and associated inflammatory changes involving the transverse colon, splenic flexure, descending and rectosigmoid colon consistent with colitis of infectious, inflammatory or ischemic etiology. Negative for free air. 2. Possible it indeterminate 15 mm nodule mid pole left kidney, could further evaluate with nonemergent MRI Electronically Signed   By: Jasmine Pang M.D.   On: 01/05/2017 22:17   Dg Chest Portable 1 View  Result Date: 01/05/2017 CLINICAL DATA:  Sepsis. EXAM: PORTABLE CHEST 1 VIEW COMPARISON:  Chest CT 09/15/2015 FINDINGS: The cardiomediastinal contours are  normal. Atherosclerosis of the aortic arch. Mild hyperinflation with biapical pleuroparenchymal scarring. Pulmonary vasculature is normal. No consolidation, pleural effusion, or pneumothorax. No acute osseous abnormalities are seen. Bones are under mineralized. Multiple overlying monitoring leads. IMPRESSION: 1. No acute abnormality. 2. Hyperinflation suggesting emphysema. Biapical pleuroparenchymal scarring. 3. Aortic atherosclerosis. Electronically Signed   By: Rubye Oaks M.D.   On: 01/05/2017 20:32    EKG: Personally reviewed, rate 103, QTC 460, subtle lateral ST depressions, no previous for comparison.    Assessment/Plan  1. Colitis: With SIRS, no evidence of organ injury. -Continue IV Levaquin and Flagyl -Continue IV fluids -Follow blood and stool cultures   2. Metabolic encephalopathy: Dementia in the setting of systemic inflammatory response. -Supportive care Delirium precautions:   -Lights and TV off, minimize interruptions at night  -Blinds open and lights on during day  -Glasses/hearing aid with patient  -Frequent reorientation  -PT/OT when able  -Avoid sedation medications/Beers list medications  3. Renal cyst:  Incidental finding. -Nonemergent MRI as outpatient  4. Hypothyroidism: -Check TSH -Continue levothyroxine  5. Parkinson's: -Continue Sinemet  6. COPD: Not clinically active. -Continue inhalers  7. Other medications: -Continue Celexa -Continue gabapentin -Continue aspirin        DVT prophylaxis: Lovenox  Code Status: DO NOT RESUSICTATE  Family Communication: None available  Disposition Plan: Anticipate IV fluids, empiric antibiotics, follow stool studies Consults called: None Admission status: OBS At the point of initial evaluation, it is my clinical opinion that admission for OBSERVATION is reasonable and necessary because the patient's presenting complaints in the context of their chronic conditions represent sufficient risk of  deterioration or significant morbidity to constitute reasonable grounds for close observation in the hospital setting, but that the patient may be medically stable for discharge from the hospital within 24 to 48 hours.    Medical decision making: Patient seen at 12:19 AM on 01/06/2017.  The patient was discussed with Dr. Freida Busman.  What exists of the patient's chart was reviewed in depth and summarized above.  Clinical condition: stable.        Alberteen Sam [facility] Pager [phone]

## 2017-01-07 LAB — CBC
HEMATOCRIT: 30 % — AB (ref 36.0–46.0)
HEMOGLOBIN: 10.1 g/dL — AB (ref 12.0–15.0)
MCH: 30.9 pg (ref 26.0–34.0)
MCHC: 33.7 g/dL (ref 30.0–36.0)
MCV: 91.7 fL (ref 78.0–100.0)
Platelets: 184 10*3/uL (ref 150–400)
RBC: 3.27 MIL/uL — ABNORMAL LOW (ref 3.87–5.11)
RDW: 12.8 % (ref 11.5–15.5)
WBC: 9.3 10*3/uL (ref 4.0–10.5)

## 2017-01-07 LAB — BASIC METABOLIC PANEL
Anion gap: 4 — ABNORMAL LOW (ref 5–15)
BUN: 17 mg/dL (ref 6–20)
CHLORIDE: 111 mmol/L (ref 101–111)
CO2: 24 mmol/L (ref 22–32)
CREATININE: 0.53 mg/dL (ref 0.44–1.00)
Calcium: 8.2 mg/dL — ABNORMAL LOW (ref 8.9–10.3)
GFR calc non Af Amer: >60 mL/min (ref >60)
Glucose, Bld: 108 mg/dL — ABNORMAL HIGH (ref 65–99)
Potassium: 3 mmol/L — ABNORMAL LOW (ref 3.5–5.1)
Sodium: 139 mmol/L (ref 135–145)

## 2017-01-07 LAB — URINE CULTURE: Culture: NO GROWTH

## 2017-01-07 LAB — GLUCOSE, CAPILLARY: Glucose-Capillary: 114 mg/dL — ABNORMAL HIGH (ref 65–99)

## 2017-01-07 MED ORDER — POTASSIUM CHLORIDE CRYS ER 20 MEQ PO TBCR
40.0000 meq | EXTENDED_RELEASE_TABLET | Freq: Once | ORAL | Status: AC
  Administered 2017-01-07: 40 meq via ORAL
  Filled 2017-01-07: qty 2

## 2017-01-07 NOTE — Progress Notes (Signed)
PROGRESS NOTE   Brittany Crosby  CHY:[id number] DOB: 1940-06-29 DOA: 01/05/2017  PCP: Sigmund Hazel, MD   Brief Narrative:  Pt is 76 yo female with known dementia, Parkinson's C, COPD, depression, and hypothyroidism presented with altered mental status, fever and nonbloody diarrhea for 3 days.  Assessment and plan: Sepsis secondary to colitis  - pet CT abd, colon wall thickening, inflammatory changes involving transverse colon, splenic flexure, descending and rectosigmoid colon - keep on Levaquin and Flagyl day #2 - continue IVF - change ABX to PO in next 24 hours - advance diet  - WBC is trending down - CBC in AM  Hypokalemia - supplement and repeat BMP in AM  15 mm nodule mid pole left kidney - can have an outpt no nemergent MRI  Metabolic encephalopathy: - Dementia in the setting of systemic inflammatory response. - more alert this AM - PT eval   Hypothyroidism: - continue Synthroid   Parkinson's: - Continue Sinemet  COPD: - continue BD's as needed   DVT prophylaxis: Lovenox SQ Code Status: DNR Family Communication: Patient at bedside  Disposition Plan: Home in 1-2 days   Consultants:   None  Procedures:   None  Antimicrobials:   Levaquin 8/16 -->  Flagyl 8/16 -->  Subjective: Pt reports feeling better.   Objective: Vitals:   01/06/17 2142 01/06/17 2311 01/06/17 2323 01/07/17 0611  BP: (!) 91/45 (!) 94/39 (!) 105/48 (!) 148/92  Pulse: 89 86  85  Resp: 18   18  Temp: 98.2 F (36.8 C)   98 F (36.7 C)  TempSrc: Oral   Oral  SpO2: 97%   97%  Weight:      Height:        Intake/Output Summary (Last 24 hours) at 01/07/17 1259 Last data filed at 01/07/17 8786  Gross per 24 hour  Intake          2245.83 ml  Output              301 ml  Net          1944.83 ml   Filed Weights   01/05/17 1925 01/06/17 0138  Weight: 58.1 kg (128 lb) 57.2 kg (126 lb 1.6 oz)    Examination:  General exam: Appears calm and comfortable  Respiratory  system: Clear to auscultation. Respiratory effort normal. Cardiovascular system: S1 & S2 heard, RRR. No JVD, rubs, gallops or clicks. No pedal edema. Gastrointestinal system: Abdomen is nondistended, soft and nontender. No organomegaly or masses felt. Normal bowel sounds heard. Central nervous system: Alert and oriented. No focal neurological deficits.  Data Reviewed: I have personally reviewed following labs and imaging studies  CBC:  Recent Labs Lab 01/05/17 1952 01/06/17 0616 01/07/17 0533  WBC 17.2* 12.0* 9.3  NEUTROABS 14.9*  --   --   HGB 12.1 12.2 10.1*  HCT 36.7 37.3 30.0*  MCV 93.6 92.8 91.7  PLT 214 187 184   Basic Metabolic Panel:  Recent Labs Lab 01/05/17 1952 01/06/17 0616 01/07/17 0533  NA 138 138 139  K 3.3* 3.4* 3.0*  CL 104 106 111  CO2 25 23 24   GLUCOSE 125* 100* 108*  BUN 22* 17 17  CREATININE 0.74 0.58 0.53  CALCIUM 9.0 8.5* 8.2*   Liver Function Tests:  Recent Labs Lab 01/05/17 1952  AST 18  ALT <5*  ALKPHOS 82  BILITOT 0.9  PROT 6.4*  ALBUMIN 3.5   Cardiac Enzymes:  Recent Labs Lab 01/05/17 2310  TROPONINI <0.03  Thyroid Function Tests:  Recent Labs  01/06/17 0616  TSH 2.223   Urine analysis:    Component Value Date/Time   COLORURINE AMBER (A) 01/05/2017 2102   APPEARANCEUR HAZY (A) 01/05/2017 2102   LABSPEC 1.020 01/05/2017 2102   PHURINE 5.0 01/05/2017 2102   GLUCOSEU NEGATIVE 01/05/2017 2102   HGBUR SMALL (A) 01/05/2017 2102   BILIRUBINUR NEGATIVE 01/05/2017 2102   KETONESUR 20 (A) 01/05/2017 2102   PROTEINUR 30 (A) 01/05/2017 2102   NITRITE NEGATIVE 01/05/2017 2102   LEUKOCYTESUR NEGATIVE 01/05/2017 2102   Recent Results (from the past 240 hour(s))  Urine Culture     Status: None   Collection Time: 01/05/17  8:33 PM  Result Value Ref Range Status   Specimen Description URINE, CATHETERIZED  Final   Special Requests NONE  Final   Culture NO GROWTH  Final   Report Status 01/07/2017 FINAL  Final    Radiology  Studies: Ct Abdomen Pelvis W Contrast Result Date: 01/05/2017 1. Colon wall thickening and associated inflammatory changes involving the transverse colon, splenic flexure, descending and rectosigmoid colon consistent with colitis of infectious, inflammatory or ischemic etiology. Negative for free air. 2. Possible it indeterminate 15 mm nodule mid pole left kidney, could further evaluate with nonemergent MRI   Dg Chest Portable 1 View Result Date: 01/05/2017 1. No acute abnormality. 2. Hyperinflation suggesting emphysema. Biapical pleuroparenchymal scarring. 3. Aortic atherosclerosis.   Scheduled Meds: . aspirin EC  81 mg Oral Daily  . carbidopa-levodopa  1 tablet Oral 4 times per day  . citalopram  40 mg Oral Daily  . donepezil  5 mg Oral QHS  . enoxaparin (LOVENOX) injection  40 mg Subcutaneous Q24H  . feeding supplement (ENSURE ENLIVE)  237 mL Oral BID BM  . levothyroxine  44 mcg Oral Once per day on Mon Wed Fri  . levothyroxine  88 mcg Oral Once per day on Sun Tue Thu Sat  . mometasone-formoterol  2 puff Inhalation BID  . potassium chloride  40 mEq Oral Once   Continuous Infusions: . 0.9 % NaCl with KCl 20 mEq / L 75 mL/hr at 01/07/17 1239  . levofloxacin (LEVAQUIN) IV Stopped (01/06/17 2132)  . metronidazole Stopped (01/07/17 1610)     LOS: 1 day   Time spent: 35 minutes   Debbora Presto, MD [facility] Pager [phone]  If 7PM-7AM, please contact night-coverage [email or web address] Password [facility] 01/07/2017, 12:59 PM

## 2017-01-07 NOTE — Evaluation (Signed)
Physical Therapy Evaluation Patient Details Name: Brittany Crosby MRN: [id number] DOB: 02-25-1941 Today's Date: 01/07/2017   History of Present Illness  The patient is a 76 year old female past medical history significant for dementia, Parkinson's C, COPD, depression, and hypothyroidism who presents with altered mental status, fever and nonbloody diarrhea for 3 days.  S/P EGD 8/17.  Clinical Impression  Pt admitted with/for AMS, fever and diarrhea.  Pt at a min guard level on evaluation .  Pt currently limited functionally due to the problems listed. ( See problems list.)   Pt will benefit from PT to maximize function and safety in order to get ready for next venue listed below.     Follow Up Recommendations Home health PT    Equipment Recommendations  None recommended by PT    Recommendations for Other Services       Precautions / Restrictions Precautions Precautions: Fall      Mobility  Bed Mobility Overal bed mobility: Needs Assistance Bed Mobility: Supine to Sit;Sit to Supine     Supine to sit: Min guard Sit to supine: Min guard   General bed mobility comments: slow and mildly effortful, but no assist needed  Transfers Overall transfer level: Needs assistance Equipment used: Rolling walker (2 wheeled) Transfers: Sit to/from Stand Sit to Stand: Min guard         General transfer comment: cues for hand placement and safety, no assist to stand  Ambulation/Gait Ambulation/Gait assistance: Min guard;Min assist Ambulation Distance (Feet): 200 Feet Assistive device: Rolling walker (2 wheeled) Gait Pattern/deviations: Step-through pattern   Gait velocity interpretation: Below normal speed for age/gender General Gait Details: good, steady use of the RW, but still tentative.  Stairs            Wheelchair Mobility    Modified Rankin (Stroke Patients Only)       Balance Overall balance assessment: Needs assistance   Sitting balance-Leahy Scale:  Good       Standing balance-Leahy Scale: Fair                               Pertinent Vitals/Pain Pain Assessment: No/denies pain    Home Living Family/patient expects to be discharged to:: Private residence Living Arrangements: Spouse/significant other Available Help at Discharge: Family;Available 24 hours/day Type of Home: House Home Access: Stairs to enter Entrance Stairs-Rails: None Entrance Stairs-Number of Steps: 2 Home Layout: One level Home Equipment: Shower seat - built in;Walker - 2 wheels;Walker - 4 wheels;Cane - single point      Prior Function Level of Independence: Independent with assistive device(s)               Hand Dominance        Extremity/Trunk Assessment   Upper Extremity Assessment Upper Extremity Assessment: Defer to OT evaluation    Lower Extremity Assessment Lower Extremity Assessment: Overall WFL for tasks assessed (bil mild proximal weakness in hams and hip flexors)       Communication   Communication: No difficulties  Cognition Arousal/Alertness: Awake/alert Behavior During Therapy: WFL for tasks assessed/performed Overall Cognitive Status: History of cognitive impairments - at baseline                                        General Comments General comments (skin integrity, edema, etc.): Mostly no overt signs pt has parkinson;s, but  when pt fatigues, she slow and gets mildly less coordinated.    Exercises     Assessment/Plan    PT Assessment Patient needs continued PT services  PT Problem List Decreased strength;Decreased activity tolerance;Decreased balance;Decreased mobility;Decreased coordination       PT Treatment Interventions Gait training;Stair training;DME instruction    PT Goals (Current goals can be found in the Care Plan section)  Acute Rehab PT Goals Patient Stated Goal: get home, continue therapy PT Goal Formulation: With patient    Frequency Min 2X/week   Barriers to  discharge        Co-evaluation               AM-PAC PT "6 Clicks" Daily Activity  Outcome Measure Difficulty turning over in bed (including adjusting bedclothes, sheets and blankets)?: A Little Difficulty moving from lying on back to sitting on the side of the bed? : A Little Difficulty sitting down on and standing up from a chair with arms (e.g., wheelchair, bedside commode, etc,.)?: A Little Help needed moving to and from a bed to chair (including a wheelchair)?: A Little Help needed walking in hospital room?: A Little Help needed climbing 3-5 steps with a railing? : A Little 6 Click Score: 18    End of Session   Activity Tolerance: Patient tolerated treatment well Patient left: in bed;with call bell/phone within reach;with bed alarm set Nurse Communication: Mobility status PT Visit Diagnosis: Unsteadiness on feet (R26.81);Other abnormalities of gait and mobility (R26.89)    Time: 9604-5409 PT Time Calculation (min) (ACUTE ONLY): 32 min   Charges:   PT Evaluation $PT Eval Moderate Complexity: 1 Mod PT Treatments $Gait Training: 8-22 mins   PT G Codes:        24-Jan-2017  [place] Bing, PT [phone]  (pager)  Eliseo Gum Carliss Quast January 24, 2017, 3:27 PM

## 2017-01-07 NOTE — Progress Notes (Signed)
Initial Nutrition Assessment  DOCUMENTATION CODES:   Not applicable  INTERVENTION:  Continue Ensure Enlive po BID, each supplement provides 350 kcal and 20 grams of protein.  Encourage adequate PO intake.   NUTRITION DIAGNOSIS:   Increased nutrient needs related to chronic illness as evidenced by estimated needs.  GOAL:   Patient will meet greater than or equal to 90% of their needs  MONITOR:   PO intake, Supplement acceptance, Labs, Weight trends, Skin, I & O's  REASON FOR ASSESSMENT:   Malnutrition Screening Tool    ASSESSMENT:   76 yo female with known dementia, Parkinson's C, COPD, depression, and hypothyroidism presented with altered mental status, fever and nonbloody diarrhea for 3 days. Pt with Sepsis secondary to colitis. CT abd, colon wall thickening, inflammatory changes involving transverse colon, splenic flexure, descending and rectosigmoid colon  Pt reports abdominal pains have improved and appetite has been increasing. Pt reports meal completion this AM was 50%. Pt reports usually eating well PTA with usual consumption of 3 meals a day up until the 3 days PTA. Pt with no significant weight loss per weight records. Pt currently has Ensure ordered and has been consuming them. RD recommended pt to continue with supplementation post discharge to aid in adequate nutrition especially if po intake is poor. Pt expressed understanding.   Nutrition-Focused physical exam completed. Findings are no fat depletion, mild to moderate muscle depletion, and no edema. Depletion likely related to the natural aging process.   Labs and medications reviewed.   Diet Order:  Diet regular Room service appropriate? Yes; Fluid consistency: Thin  Skin:  Reviewed, no issues  Last BM:  8/17  Height:   Ht Readings from Last 1 Encounters:  01/06/17 5\' 3"  (1.6 m)    Weight:   Wt Readings from Last 1 Encounters:  01/06/17 126 lb 1.6 oz (57.2 kg)    Ideal Body Weight:  52.27  kg  BMI:  Body mass index is 22.34 kg/m.  Estimated Nutritional Needs:   Kcal:  1600-1800  Protein:  70-80 grams  Fluid:  1.6 - 1.8 L/day  EDUCATION NEEDS:   Education needs addressed  Roslyn Smiling, MS, RD, LDN Pager # [phone] After hours/ weekend pager # [phone]

## 2017-01-08 LAB — GASTROINTESTINAL PANEL BY PCR, STOOL (REPLACES STOOL CULTURE)

## 2017-01-08 LAB — BASIC METABOLIC PANEL
ANION GAP: 6 (ref 5–15)
BUN: 13 mg/dL (ref 6–20)
CO2: 23 mmol/L (ref 22–32)
Calcium: 8 mg/dL — ABNORMAL LOW (ref 8.9–10.3)
Chloride: 111 mmol/L (ref 101–111)
Creatinine, Ser: 0.51 mg/dL (ref 0.44–1.00)
GLUCOSE: 93 mg/dL (ref 65–99)
POTASSIUM: 3.2 mmol/L — AB (ref 3.5–5.1)
Sodium: 140 mmol/L (ref 135–145)

## 2017-01-08 LAB — CBC
HCT: 30 % — ABNORMAL LOW (ref 36.0–46.0)
Hemoglobin: 9.9 g/dL — ABNORMAL LOW (ref 12.0–15.0)
MCH: 30.7 pg (ref 26.0–34.0)
MCHC: 33 g/dL (ref 30.0–36.0)
MCV: 93.2 fL (ref 78.0–100.0)
PLATELETS: 180 10*3/uL (ref 150–400)
RBC: 3.22 MIL/uL — AB (ref 3.87–5.11)
RDW: 13.1 % (ref 11.5–15.5)
WBC: 6.1 10*3/uL (ref 4.0–10.5)

## 2017-01-08 MED ORDER — LEVOFLOXACIN 750 MG PO TABS
750.0000 mg | ORAL_TABLET | Freq: Every day | ORAL | Status: DC
  Administered 2017-01-08: 750 mg via ORAL
  Filled 2017-01-08: qty 1

## 2017-01-08 MED ORDER — POTASSIUM CHLORIDE CRYS ER 20 MEQ PO TBCR
40.0000 meq | EXTENDED_RELEASE_TABLET | Freq: Once | ORAL | Status: AC
  Administered 2017-01-08: 40 meq via ORAL
  Filled 2017-01-08: qty 2

## 2017-01-08 MED ORDER — LATANOPROST 0.005 % OP SOLN
1.0000 [drp] | Freq: Every day | OPHTHALMIC | Status: DC
  Administered 2017-01-09: 1 [drp] via OPHTHALMIC
  Filled 2017-01-08: qty 2.5

## 2017-01-08 MED ORDER — METRONIDAZOLE 500 MG PO TABS
500.0000 mg | ORAL_TABLET | Freq: Three times a day (TID) | ORAL | Status: DC
  Administered 2017-01-08 – 2017-01-09 (×4): 500 mg via ORAL
  Filled 2017-01-08 (×4): qty 1

## 2017-01-08 NOTE — Progress Notes (Signed)
Pharmacy Antibiotic Note  Brittany Crosby is a 76 y.o. female admitted on 01/05/2017 with intra-abdominal infection.  Pharmacy has been consulted for levaquin and flagyl dosing.   Today is d#4 ABX, WBC down to 6.1, LA wnl, afebrile. Scr low stable.   Plan:  Levaquin 750mg  IV q24  Flagyl 500mg  IV q8  f/u LOT  Monitor clinical picture, WBC, Tmax, renal function   Height: 5\' 3"  (160 cm) Weight: 126 lb 1.6 oz (57.2 kg) IBW/kg (Calculated) : 52.4  Temp (24hrs), Avg:98.6 F (37 C), Min:98.2 F (36.8 C), Max:98.8 F (37.1 C)   Recent Labs Lab 01/05/17 1952 01/05/17 2001 01/05/17 2334 01/06/17 0616 01/07/17 0533 01/08/17 0449  WBC 17.2*  --   --  12.0* 9.3 6.1  CREATININE 0.74  --   --  0.58 0.53 0.51  LATICACIDVEN  --  1.06 1.2  --   --   --     Estimated Creatinine Clearance: 51 mL/min (by C-G formula based on SCr of 0.51 mg/dL).    Allergies  Allergen Reactions  . Codeine Other (See Comments)    Abdominal pain  . Sulfa Antibiotics Swelling  . Penicillins Rash    Antimicrobials this admission: 8/15 Levaquin >>  8/15 Flagyl >>  Dose adjustments this admission: n/a  Microbiology results: 8/15 urine >> ngtd final 81/5 GI panel >> sent  Thank you for allowing pharmacy to be a part of this patient's care.  ANADALAY PELAN 01/08/2017 8:11 AM

## 2017-01-08 NOTE — Progress Notes (Signed)
Patient had acute confusion episode with PT. MD notified.

## 2017-01-08 NOTE — Progress Notes (Signed)
PROGRESS NOTE  Brittany Crosby  ZOX:[id number] DOB: Jun 05, 1940 DOA: 01/05/2017  PCP: Sigmund Hazel, MD   Brief Narrative:  Pt is 76 yo female with known dementia, Parkinson's C, COPD, depression, and hypothyroidism presented with altered mental status, fever and nonbloody diarrhea for 3 days.  Assessment and plan: Sepsis secondary to colitis  - pet CT abd, colon wall thickening, inflammatory changes involving transverse colon, splenic flexure, descending and rectosigmoid colon - keep on Levaquin and Flagyl day #3 but will change to PO today  - stop IVF - if pt tolerating PO ABX well, can be discharged in AM  Hypokalemia - cont to supplement and repeat BMP in AM  15 mm nodule mid pole left kidney - can have an outpt no nemergent MRI  Metabolic encephalopathy: - Dementia in the setting of systemic inflammatory response. - still with intermittent episodes of confusion - monitor   Hypothyroidism: - continue Synthroid   Parkinson's: - Continue Sinemet  COPD: - continue BD's as needed   DVT prophylaxis: Lovenox SQ Code Status: DNR Family Communication: pt at bedside, gave pt my phone number for her family to call me, awaiting call back Disposition Plan: home in AM  Consultants:   None  Procedures:   None  Antimicrobials:   Levaquin 8/16 -->  Flagyl 8/16 -->  Subjective: Pt reports feeling better.   Objective: Vitals:   01/07/17 0611 01/07/17 1457 01/07/17 2123 01/08/17 0457  BP: (!) 148/92 (!) 113/57 (!) 117/51 (!) 119/49  Pulse: 85 83 70 68  Resp: 18 16 18 18   Temp: 98 F (36.7 C) 98.2 F (36.8 C) 98.8 F (37.1 C) 98.7 F (37.1 C)  TempSrc: Oral Oral Oral Oral  SpO2: 97%  97% 98%  Weight:      Height:        Intake/Output Summary (Last 24 hours) at 01/08/17 1108 Last data filed at 01/07/17 1514  Gross per 24 hour  Intake           751.25 ml  Output                0 ml  Net           751.25 ml   Filed Weights   01/05/17 1925 01/06/17  0138  Weight: 58.1 kg (128 lb) 57.2 kg (126 lb 1.6 oz)    Physical Exam  Constitutional: Appears calm, NAD CVS: RRR, S1/S2 +, no murmurs, no gallops, no carotid bruit.  Pulmonary: Effort and breath sounds normal, no stridor, diminished breath sounds at bases  Abdominal: Soft. BS +,  no distension, tenderness, rebound or guarding.  Musculoskeletal: Normal range of motion. No edema and no tenderness.   Data Reviewed: I have personally reviewed following labs and imaging studies  CBC:  Recent Labs Lab 01/05/17 1952 01/06/17 0616 01/07/17 0533 01/08/17 0449  WBC 17.2* 12.0* 9.3 6.1  NEUTROABS 14.9*  --   --   --   HGB 12.1 12.2 10.1* 9.9*  HCT 36.7 37.3 30.0* 30.0*  MCV 93.6 92.8 91.7 93.2  PLT 214 187 184 180   Basic Metabolic Panel:  Recent Labs Lab 01/05/17 1952 01/06/17 0616 01/07/17 0533 01/08/17 0449  NA 138 138 139 140  K 3.3* 3.4* 3.0* 3.2*  CL 104 106 111 111  CO2 25 23 24 23   GLUCOSE 125* 100* 108* 93  BUN 22* 17 17 13   CREATININE 0.74 0.58 0.53 0.51  CALCIUM 9.0 8.5* 8.2* 8.0*   Liver Function Tests:  Recent Labs Lab 01/05/17 1952  AST 18  ALT <5*  ALKPHOS 82  BILITOT 0.9  PROT 6.4*  ALBUMIN 3.5   Cardiac Enzymes:  Recent Labs Lab 01/05/17 2310  TROPONINI <0.03   Thyroid Function Tests:  Recent Labs  01/06/17 0616  TSH 2.223   Urine analysis:    Component Value Date/Time   COLORURINE AMBER (A) 01/05/2017 2102   APPEARANCEUR HAZY (A) 01/05/2017 2102   LABSPEC 1.020 01/05/2017 2102   PHURINE 5.0 01/05/2017 2102   GLUCOSEU NEGATIVE 01/05/2017 2102   HGBUR SMALL (A) 01/05/2017 2102   BILIRUBINUR NEGATIVE 01/05/2017 2102   KETONESUR 20 (A) 01/05/2017 2102   PROTEINUR 30 (A) 01/05/2017 2102   NITRITE NEGATIVE 01/05/2017 2102   LEUKOCYTESUR NEGATIVE 01/05/2017 2102   Recent Results (from the past 240 hour(s))  Urine Culture     Status: None   Collection Time: 01/05/17  8:33 PM  Result Value Ref Range Status   Specimen  Description URINE, CATHETERIZED  Final   Special Requests NONE  Final   Culture NO GROWTH  Final   Report Status 01/07/2017 FINAL  Final    Radiology Studies: Ct Abdomen Pelvis W Contrast Result Date: 01/05/2017 1. Colon wall thickening and associated inflammatory changes involving the transverse colon, splenic flexure, descending and rectosigmoid colon consistent with colitis of infectious, inflammatory or ischemic etiology. Negative for free air. 2. Possible it indeterminate 15 mm nodule mid pole left kidney, could further evaluate with nonemergent MRI   Dg Chest Portable 1 View Result Date: 01/05/2017 1. No acute abnormality. 2. Hyperinflation suggesting emphysema. Biapical pleuroparenchymal scarring. 3. Aortic atherosclerosis.   Scheduled Meds: . aspirin EC  81 mg Oral Daily  . carbidopa-levodopa  1 tablet Oral 4 times per day  . citalopram  40 mg Oral Daily  . donepezil  5 mg Oral QHS  . enoxaparin (LOVENOX) injection  40 mg Subcutaneous Q24H  . feeding supplement (ENSURE ENLIVE)  237 mL Oral BID BM  . levofloxacin  750 mg Oral q1800  . levothyroxine  44 mcg Oral Once per day on Mon Wed Fri  . levothyroxine  88 mcg Oral Once per day on Sun Tue Thu Sat  . metroNIDAZOLE  500 mg Oral Q8H  . mometasone-formoterol  2 puff Inhalation BID  . potassium chloride  40 mEq Oral Once   Continuous Infusions:    LOS: 2 days   Time spent: 25 minutes   Debbora Presto, MD [facility] Pager [phone]  If 7PM-7AM, please contact night-coverage [email or web address] Password [email or web address] 01/08/2017, 11:08 AM

## 2017-01-08 NOTE — Evaluation (Signed)
Occupational Therapy Evaluation Patient Details Name: Brittany Crosby MRN: [id number] DOB: [birth year] Today's Date: 01/08/2017    History of Present Illness The patient is a 76 year old female past medical history significant for dementia, Parkinson's C, COPD, depression, and hypothyroidism who presents with altered mental status, fever and nonbloody diarrhea for 3 days.  S/P EGD 8/17.   Clinical Impression   Pt admitted with the above diagnoses and presents with below problem list. Pt will benefit from continued acute OT to address the below listed deficits and maximize independence with basic ADLs prior to d/c home with assistance. PTA pt was mod I with ADLs. Pt is currently min A with LB ADLs, min guard for functional tasks in standing position and for functional mobility. Of note, once EOB pt remarked, "Do you think it's ok to get up with the children here." No visitors present in room. When asked if pt saw someone else in the room she gave a light chuckle and shook her head. Pt made further remark about the children leaving their clothes around in the room pointing to her hospital bag on the windowsill and the trash can by the door. Pleasant demeanor throughout.      Follow Up Recommendations  Home health OT;Supervision/Assistance - 24 hour    Equipment Recommendations  3 in 1 bedside commode    Recommendations for Other Services       Precautions / Restrictions Precautions Precautions: Fall Restrictions Weight Bearing Restrictions: No      Mobility Bed Mobility Overal bed mobility: Needs Assistance Bed Mobility: Supine to Sit;Sit to Supine     Supine to sit: Min guard Sit to supine: Min guard   General bed mobility comments: slow and mildly effortful, but no assist needed  Transfers Overall transfer level: Needs assistance Equipment used: Rolling walker (2 wheeled) Transfers: Sit to/from Stand Sit to Stand: Min guard         General transfer comment: cues for  hand placement and safety, no assist to stand    Balance Overall balance assessment: Needs assistance   Sitting balance-Leahy Scale: Good       Standing balance-Leahy Scale: Fair Standing balance comment: single extremity support during grooming task at sink. alternating UEs on rw.                            ADL either performed or assessed with clinical judgement   ADL Overall ADL's : Needs assistance/impaired Eating/Feeding: Set up;Sitting   Grooming: Wash/dry hands;Min guard;Standing Grooming Details (indicate cue type and reason): cueing for location of soap and paper towels. single extremity support throughout. Upper Body Bathing: Set up;Minimal assistance;Sitting   Lower Body Bathing: Minimal assistance;Sit to/from stand   Upper Body Dressing : Set up;Sitting   Lower Body Dressing: Minimal assistance;Sit to/from stand   Toilet Transfer: Minimal assistance;Ambulation;RW Toilet Transfer Details (indicate cue type and reason): 3n1 over toilet Toileting- Clothing Manipulation and Hygiene: Minimal assistance;Sit to/from stand Toileting - Clothing Manipulation Details (indicate cue type and reason): assist to complete pericare and LB dressing in standing Tub/ Shower Transfer: Tub transfer;Moderate assistance;Ambulation;Shower seat;3 in Scientist, water quality Details (indicate cue type and reason): clinical judgement Functional mobility during ADLs: Min guard;Rolling walker General ADL Comments: Pt completed bed mobility, in-room functional mobility, toilet transfer, pericare, LB dressing, and 1 grooming task at sink as detailed above. Pt with poor standing balance needing external support for dynamic functional tasks. Pleasantly confused at times.  Vision         Perception     Praxis      Pertinent Vitals/Pain Pain Assessment: Faces Faces Pain Scale: Hurts a little bit Pain Location: abdomnen Pain Descriptors / Indicators: Sore Pain  Intervention(s): Limited activity within patient's tolerance;Monitored during session;Repositioned     Hand Dominance     Extremity/Trunk Assessment Upper Extremity Assessment Upper Extremity Assessment: Generalized weakness;Overall [facility] for tasks assessed   Lower Extremity Assessment Lower Extremity Assessment: Defer to PT evaluation   Cervical / Trunk Assessment Cervical / Trunk Assessment: Kyphotic   Communication Communication Communication: No difficulties   Cognition Arousal/Alertness: Awake/alert Behavior During Therapy: WFL for tasks assessed/performed Overall Cognitive Status: History of cognitive impairments - at baseline                                 General Comments: Once EOB pt remarked, "Do you think it's ok to get up with the children here." No visitors present in room. When asked if pt saw someone else in the room she gave a light chuckle and shook her head. Pt made further remark about the children leaving their clothes around in the room pointing to her hospital bag on the windowsill and the trash can by the door. Pleasant demeanor throughout.   General Comments       Exercises     Shoulder Instructions      Home Living Family/patient expects to be discharged to:: Private residence Living Arrangements: Spouse/significant other Available Help at Discharge: Family;Available 24 hours/day Type of Home: House Home Access: Stairs to enter [employer] of Steps: 2 Entrance Stairs-Rails: None Home Layout: One level     Bathroom Shower/Tub: Chief Strategy Officer: Standard     Home Equipment: Shower seat - built in;Walker - 2 wheels;Walker - 4 wheels;Cane - single point          Prior Functioning/Environment Level of Independence: Independent with assistive device(s)        Comments: mostly sponge bathes recently due to fear of falling        OT Problem List: Decreased strength;Decreased activity  tolerance;Impaired balance (sitting and/or standing);Decreased cognition;Decreased safety awareness;Decreased knowledge of use of DME or AE;Decreased knowledge of precautions;Pain      OT Treatment/Interventions: Self-care/ADL training;Therapeutic exercise;DME and/or AE instruction;Energy conservation;Therapeutic activities;Patient/family education;Balance training    OT Goals(Current goals can be found in the care plan section) Acute Rehab OT Goals Patient Stated Goal: get home, continue therapy OT Goal Formulation: With patient Time For Goal Achievement: 01/22/17 Potential to Achieve Goals: Good ADL Goals Pt Will Perform Grooming: with supervision;standing;sitting (2-3 tasks) Pt Will Perform Lower Body Bathing: with min guard assist;sit to/from stand Pt Will Perform Lower Body Dressing: with min guard assist;sit to/from stand Pt Will Perform Tub/Shower Transfer: with min guard assist;ambulating;Tub transfer;3 in 1;shower seat;rolling walker  OT Frequency: Min 2X/week   Barriers to D/C:            Co-evaluation              AM-PAC PT "6 Clicks" Daily Activity     Outcome Measure Help from another person eating meals?: None Help from another person taking care of personal grooming?: A Little Help from another person toileting, which includes using toliet, bedpan, or urinal?: A Little Help from another person bathing (including washing, rinsing, drying)?: A Little Help from another person to put on and taking  off regular upper body clothing?: A Little Help from another person to put on and taking off regular lower body clothing?: A Little 6 Click Score: 19   End of Session Equipment Utilized During Treatment: Gait belt;Rolling walker Nurse Communication: Other (comment) (confusion vs. hallucination? during session. see cognition.)  Activity Tolerance: Patient tolerated treatment well Patient left: in bed;with call bell/phone within reach;with bed alarm set  OT Visit  Diagnosis: Other abnormalities of gait and mobility (R26.89);Muscle weakness (generalized) (M62.81);History of falling (Z91.81);Ataxia, unspecified (R27.0);Pain                Time: 0929-1000 OT Time Calculation (min): 31 min Charges:  OT General Charges $OT Visit: 1 Procedure OT Evaluation $OT Eval Low Complexity: 1 Procedure OT Treatments $Self Care/Home Management : 8-22 mins G-Codes:       Pilar Grammes 01/08/2017, 11:04 AM

## 2017-01-09 LAB — CBC
HCT: 32.5 % — ABNORMAL LOW (ref 36.0–46.0)
Hemoglobin: 10.7 g/dL — ABNORMAL LOW (ref 12.0–15.0)
MCH: 30.7 pg (ref 26.0–34.0)
MCHC: 32.9 g/dL (ref 30.0–36.0)
MCV: 93.4 fL (ref 78.0–100.0)
PLATELETS: 225 10*3/uL (ref 150–400)
RBC: 3.48 MIL/uL — AB (ref 3.87–5.11)
RDW: 13.2 % (ref 11.5–15.5)
WBC: 5.7 10*3/uL (ref 4.0–10.5)

## 2017-01-09 LAB — BASIC METABOLIC PANEL
Anion gap: 7 (ref 5–15)
BUN: 8 mg/dL (ref 6–20)
CALCIUM: 8.2 mg/dL — AB (ref 8.9–10.3)
CO2: 24 mmol/L (ref 22–32)
CREATININE: 0.5 mg/dL (ref 0.44–1.00)
Chloride: 108 mmol/L (ref 101–111)
GFR calc Af Amer: >60 mL/min (ref >60)
Glucose, Bld: 114 mg/dL — ABNORMAL HIGH (ref 65–99)
POTASSIUM: 3.3 mmol/L — AB (ref 3.5–5.1)
SODIUM: 139 mmol/L (ref 135–145)

## 2017-01-09 MED ORDER — METRONIDAZOLE 500 MG PO TABS: 500.0000 mg | ORAL_TABLET | Freq: Three times a day (TID) | ORAL | 0 refills | Status: DC

## 2017-01-09 MED ORDER — SODIUM CHLORIDE 0.9 % IV SOLN: INTRAVENOUS | Status: DC

## 2017-01-09 MED ORDER — POTASSIUM CHLORIDE CRYS ER 20 MEQ PO TBCR
40.0000 meq | EXTENDED_RELEASE_TABLET | Freq: Once | ORAL | Status: AC
  Administered 2017-01-09: 40 meq via ORAL
  Filled 2017-01-09: qty 2

## 2017-01-09 MED ORDER — LEVOFLOXACIN 750 MG PO TABS: 750.0000 mg | ORAL_TABLET | Freq: Every day | ORAL | 0 refills | Status: DC

## 2017-01-09 MED ORDER — POTASSIUM CHLORIDE CRYS ER 20 MEQ PO TBCR: 20.0000 meq | EXTENDED_RELEASE_TABLET | Freq: Every day | ORAL | 0 refills | Status: AC

## 2017-01-09 NOTE — Discharge Summary (Signed)
Physician Discharge Summary  Gill Sabat ZOX:[id number] DOB: 12-25-1940 DOA: 01/05/2017  PCP: Sigmund Hazel, MD  Admit date: 01/05/2017 Discharge date: 01/09/2017  Recommendations for Outpatient Follow-up:  1. Pt will need to follow up with PCP in 1-2 weeks post discharge 2. Please obtain BMP to evaluate electrolytes and kidney function 3. Please also check CBC to evaluate Hg and Hct levels 4. Pt to complete therapy with Flagyl and Levaquin 5. 15 mm nodule mid pole left kidney, can have an outpt no nemergent MRI  Discharge Diagnoses:  Principal Problem:   Colitis Active Problems:   COPD, severe (HCC)   Dementia due to Parkinson's disease with behavioral disturbance (HCC)   Parkinson disease (HCC)   Essential hypertension   Hypothyroidism  Discharge Condition: Stable  Diet recommendation: Heart healthy diet discussed in details   History of present illness:  Pt is 76 yo female with known dementia, Parkinson's C, COPD, depression, and hypothyroidism presentedwith altered mental status, fever and nonbloody diarrhea for 3 days.  Assessment and plan: Sepsis secondary to colitis  - pet CT abd, colon wall thickening, inflammatory changes involving transverse colon, splenic flexure, descending and rectosigmoid colon - keep on Levaquin and Flagyl day #3 and complete therapy on discharge, 11 more days  - pt tolerating diet well, says she is ready to go home  Hypokalemia - supplemented prior to discharge   15 mm nodule mid pole left kidney - can have an outpt no nemergent MRI  Metabolic encephalopathy: - Dementia in the setting of systemic inflammatory response. - still with intermittent episodes of confusion - resolved   Hypothyroidism: - continue Synthroid   Parkinson's: - Continue Sinemet  COPD: - continue BD's as needed   DVT prophylaxis: Lovenox SQ Code Status: DNR Family Communication: pt at bedside, left number for family to call me back  Disposition  Plan: home   Consultants:   None  Procedures:   None  Antimicrobials:   Levaquin 8/16 -->  Flagyl 8/16 -->  Procedures/Studies: Ct Abdomen Pelvis W Contrast  Result Date: 01/05/2017 CLINICAL DATA:  Altered mental status, diarrhea and nausea EXAM: CT ABDOMEN AND PELVIS WITH CONTRAST TECHNIQUE: Multidetector CT imaging of the abdomen and pelvis was performed using the standard protocol following bolus administration of intravenous contrast. CONTRAST:  ISOVUE-300 IOPAMIDOL (ISOVUE-300) INJECTION 61% COMPARISON:  CT chest 01/15/2016 FINDINGS: Lower chest: Lung bases demonstrate no acute consolidation or pleural effusion. Heart size is within normal limits Hepatobiliary: Subcentimeter hypodensity in the posterior right hepatic lobe, too small to further characterize. Surgical clips in the gallbladder fossa. Negative for biliary dilatation Pancreas: Unremarkable. No pancreatic ductal dilatation or surrounding inflammatory changes. Spleen: Normal in size without focal abnormality. Adrenals/Urinary Tract: Adrenal glands are within normal limits. Multiple subcentimeter renal hypodensities too small to further characterize. Negative for hydronephrosis. Possible indeterminate 15 mm nodule mid pole left kidney. The bladder is unremarkable. Stomach/Bowel: The stomach is nonenlarged. Appendix not clearly visible. No dilated small bowel. Colon wall thickening with surrounding inflammation involving the transverse colon, splenic flexure, descending and rectosigmoid colon. Vascular/Lymphatic: Aortic atherosclerosis. Subcentimeter retroperitoneal lymph nodes. Reproductive: Status post hysterectomy. No adnexal masses. Other: Negative for free air.  No significant ascites. Musculoskeletal: Trace anterolisthesis of L4 on L5. Multilevel degenerative changes. IMPRESSION: 1. Colon wall thickening and associated inflammatory changes involving the transverse colon, splenic flexure, descending and rectosigmoid  colon consistent with colitis of infectious, inflammatory or ischemic etiology. Negative for free air. 2. Possible it indeterminate 15 mm nodule mid pole left kidney,  could further evaluate with nonemergent MRI Electronically Signed   By: Jasmine Pang M.D.   On: 01/05/2017 22:17   Dg Op Swallowing Func-medicare/speech Path  Result Date: 12/28/2016 Objective Swallowing Evaluation: Type of Study: MBS-Modified Barium Swallow Study Patient Details Name: Lisbet Busker MRN: [id number] Date of Birth: Jul 12, 1940 Today's Date: 12/28/2016 Time: SLP Start Time (ACUTE ONLY): 1127-SLP Stop Time (ACUTE ONLY): 1152 SLP Time Calculation (min) (ACUTE ONLY): 25 min Past Medical History: Past Medical History: Diagnosis Date . Anxiety  . Colon polyps  . COPD (chronic obstructive pulmonary disease) (HCC)  . Disorder of bone and cartilage, unspecified  . Glaucoma  . Glaucoma   unspecified . H/O falling  . Hiatal hernia  . Hypercholesteremia  . Major depressive disorder, single episode, unspecified  . Nicotine dependence   history . Thyroid disease hypothyroidism Past Surgical History: Past Surgical History: Procedure Laterality Date . ABDOMINAL HYSTERECTOMY   . APPENDECTOMY   . BACK SURGERY   . BLADDER AUGMENTATION   . CHOLECYSTECTOMY   . TONSILLECTOMY   HPI: Pt is a 76 yo female who presents for OP MBS to assess swallowing function due to subjective reports of food and liquid getting "stuck" as well as hoarseness that worsens in the . Upper GI revealed esophageal dysmotility and hiatal hernia. PMH includes parkinsonism, COPD, nocturnal hypoxemia. Subjective: pt alert, pleasant, describes food/water getting "stuck" Assessment / Plan / Recommendation CHL IP CLINICAL IMPRESSIONS 12/28/2016 Clinical Impression Pt presents with a functional oropharyngeal swallow with no aspiration and no residuals post-swallow, even when pt subjectively reported feeling like some of the solid food was "stuck". Pt did have a tendency to take very small  bites/sips and played with them in the anterior portion of her mouth before swallowing, but this appeared to be more volitional in nature given that she felt worried about swallowing. Suspect that her globus sensation may be related to her esophageal issues. Recommend to continue up to regular textures and thin liquids as tolerated. Esophageal precautions were provided. SLP Visit Diagnosis Dysphagia, unspecified (R13.10) Attention and concentration deficit following -- Frontal lobe and executive function deficit following -- Impact on safety and function No limitations   CHL IP TREATMENT RECOMMENDATION 12/28/2016 Treatment Recommendations No treatment recommended at this time   No flowsheet data found. CHL IP DIET RECOMMENDATION 12/28/2016 SLP Diet Recommendations Regular solids;Thin liquid Liquid Administration via Cup;Straw Medication Administration Whole meds with liquid Compensations Slow rate;Small sips/bites;Follow solids with liquid Postural Changes Remain semi-upright after after feeds/meals (Comment);Seated upright at 90 degrees   CHL IP OTHER RECOMMENDATIONS 12/28/2016 Recommended Consults -- Oral Care Recommendations Oral care BID Other Recommendations --   CHL IP FOLLOW UP RECOMMENDATIONS 12/28/2016 Follow up Recommendations None   No flowsheet data found.     CHL IP ORAL PHASE 12/28/2016 Oral Phase WFL Oral - Pudding Teaspoon -- Oral - Pudding Cup -- Oral - Honey Teaspoon -- Oral - Honey Cup -- Oral - Nectar Teaspoon -- Oral - Nectar Cup -- Oral - Nectar Straw -- Oral - Thin Teaspoon -- Oral - Thin Cup -- Oral - Thin Straw -- Oral - Puree -- Oral - Mech Soft -- Oral - Regular -- Oral - Multi-Consistency -- Oral - Pill -- Oral Phase - Comment --  CHL IP PHARYNGEAL PHASE 12/28/2016 Pharyngeal Phase WFL Pharyngeal- Pudding Teaspoon -- Pharyngeal -- Pharyngeal- Pudding Cup -- Pharyngeal -- Pharyngeal- Honey Teaspoon -- Pharyngeal -- Pharyngeal- Honey Cup -- Pharyngeal -- Pharyngeal- Nectar Teaspoon -- Pharyngeal --  Pharyngeal- Nectar Cup --  Pharyngeal -- Pharyngeal- Nectar Straw -- Pharyngeal -- Pharyngeal- Thin Teaspoon -- Pharyngeal -- Pharyngeal- Thin Cup -- Pharyngeal -- Pharyngeal- Thin Straw -- Pharyngeal -- Pharyngeal- Puree -- Pharyngeal -- Pharyngeal- Mechanical Soft -- Pharyngeal -- Pharyngeal- Regular -- Pharyngeal -- Pharyngeal- Multi-consistency -- Pharyngeal -- Pharyngeal- Pill -- Pharyngeal -- Pharyngeal Comment --  CHL IP CERVICAL ESOPHAGEAL PHASE 12/28/2016 Cervical Esophageal Phase WFL Pudding Teaspoon -- Pudding Cup -- Honey Teaspoon -- Honey Cup -- Nectar Teaspoon -- Nectar Cup -- Nectar Straw -- Thin Teaspoon -- Thin Cup -- Thin Straw -- Puree -- Mechanical Soft -- Regular -- Multi-consistency -- Pill -- Cervical Esophageal Comment -- CHL IP GO 12/28/2016 Functional Assessment Tool Used skilled clinical judgment Functional Limitations Swallowing Swallow Current Status (W0981) CI Swallow Goal Status (X9147) CI Swallow Discharge Status (W2956) CI Motor Speech Current Status (O1308) (None) Motor Speech Goal Status (M5784) (None) Motor Speech Goal Status (O9629) (None) Spoken Language Comprehension Current Status (B2841) (None) Spoken Language Comprehension Goal Status (L2440) (None) Spoken Language Comprehension Discharge Status (N0272) (None) Spoken Language Expression Current Status (Z3664) (None) Spoken Language Expression Goal Status (Q0347) (None) Spoken Language Expression Discharge Status (Q2595) (None) Attention Current Status (G3875) (None) Attention Goal Status (I4332) (None) Attention Discharge Status (R5188) (None) Memory Current Status (C1660) (None) Memory Goal Status (Y3016) (None) Memory Discharge Status (W1093) (None) Voice Current Status (A3557) (None) Voice Goal Status (D2202) (None) Voice Discharge Status (R4270) (None) Other Speech-Language Pathology Functional Limitation Current Status (W2376) (None) Other Speech-Language Pathology Functional Limitation Goal Status (E8315) (None) Other  Speech-Language Pathology Functional Limitation Discharge Status [phone]) (None) Maxcine Ham 12/28/2016, 1:09 PM  Maxcine Ham, M.A. CCC-SLP [phone]           CLINICAL DATA:  Globus sensation.  Hoarseness. EXAM: MODIFIED BARIUM SWALLOW TECHNIQUE: Different consistencies of barium were administered orally to the patient by the Speech Pathologist. Imaging of the pharynx was performed in the lateral projection. FLUOROSCOPY TIME:  Fluoroscopy Time:  1 minutes and 31 seconds Radiation Exposure Index (if provided by the fluoroscopic device): Number of Acquired Spot Images: 0 COMPARISON:  None. FINDINGS: Thin liquid- within normal limits Nectar thick liquid- not performed Honey- not performed Pure- within normal limits Cracker-not performed Pure with cracker- within normal limits Barium tablet -  not performed IMPRESSION: Normal examination. Please refer to the Speech Pathologists report for complete details and recommendations. Electronically Signed   By: Rudie Meyer M.D.   On: 12/28/2016 13:03   Dg Chest Portable 1 View  Result Date: 01/05/2017 CLINICAL DATA:  Sepsis. EXAM: PORTABLE CHEST 1 VIEW COMPARISON:  Chest CT 09/15/2015 FINDINGS: The cardiomediastinal contours are normal. Atherosclerosis of the aortic arch. Mild hyperinflation with biapical pleuroparenchymal scarring. Pulmonary vasculature is normal. No consolidation, pleural effusion, or pneumothorax. No acute osseous abnormalities are seen. Bones are under mineralized. Multiple overlying monitoring leads. IMPRESSION: 1. No acute abnormality. 2. Hyperinflation suggesting emphysema. Biapical pleuroparenchymal scarring. 3. Aortic atherosclerosis. Electronically Signed   By: Rubye Oaks M.D.   On: 01/05/2017 20:32   Discharge Exam: Vitals:   01/08/17 2114 01/09/17 0430  BP: (!) 118/52 139/64  Pulse: 74 78  Resp: 18 18  Temp: 98.2 F (36.8 C) 98.1 F (36.7 C)  SpO2: 95% 98%   Vitals:   01/08/17 1357 01/08/17 2114 01/08/17 2114  01/09/17 0430  BP: 109/64 (!) 118/52 (!) 118/52 139/64  Pulse: 76 74 74 78  Resp: 16 18 18 18   Temp: 99.5 F (37.5 C) 98.2 F (36.8 C) 98.2 F (36.8  C) 98.1 F (36.7 C)  TempSrc: Oral Oral Oral Oral  SpO2: 96% 95% 95% 98%  Weight:      Height:        General: Pt is alert, follows commands appropriately, not in acute distress Cardiovascular: Regular rate and rhythm, S1/S2 +, no murmurs, no rubs, no gallops Respiratory: Clear to auscultation bilaterally, no wheezing, no crackles, no rhonchi Abdominal: Soft, non tender, non distended, bowel sounds +, no guarding   Discharge Instructions  Discharge Instructions    Diet - low sodium heart healthy    Complete by:  As directed    Diet - low sodium heart healthy    Complete by:  As directed    Increase activity slowly    Complete by:  As directed    Increase activity slowly    Complete by:  As directed      Allergies as of 01/09/2017      Reactions   Codeine Other (See Comments)   Abdominal pain   Sulfa Antibiotics Swelling   Penicillins Rash      Medication List    TAKE these medications   AEROCHAMBER MV inhaler Use as instructed   aspirin EC 81 MG tablet Take 81 mg by mouth daily.   budesonide-formoterol 160-4.5 MCG/ACT inhaler Commonly known as:  SYMBICORT Inhale 2 puffs into the lungs 2 (two) times daily.   carbidopa-levodopa 25-100 MG tablet Commonly known as:  SINEMET IR Take 1 tablet by mouth 4 (four) times daily. At 8 AM, 12, 4 PM and 8 PM   citalopram 40 MG tablet Commonly known as:  CELEXA Take 40 mg by mouth daily.   donepezil 5 MG tablet Commonly known as:  ARICEPT Take 5 mg by mouth at bedtime.   gabapentin 100 MG capsule Commonly known as:  NEURONTIN Take 2 capsules (200 mg total) by mouth at bedtime. What changed:  how much to take   latanoprost 0.005 % ophthalmic solution Commonly known as:  XALATAN Place 1 drop into both eyes at bedtime.   levofloxacin 750 MG tablet Commonly known  as:  LEVAQUIN Take 1 tablet (750 mg total) by mouth daily at 6 PM.   levothyroxine 88 MCG tablet Commonly known as:  SYNTHROID, LEVOTHROID Take 44-88 mcg by mouth daily. Take on Monday, Wednesday and Friday.  Take 88 mcg all other days of the week   metroNIDAZOLE 500 MG tablet Commonly known as:  FLAGYL Take 1 tablet (500 mg total) by mouth every 8 (eight) hours.   omeprazole 20 MG capsule Commonly known as:  PRILOSEC Take 20 mg by mouth daily.   ONE-A-DAY WOMENS 50+ ADVANTAGE PO Take 1 tablet by mouth daily.   OXYGEN Inhale 2 L into the lungs at bedtime.   potassium chloride SA 20 MEQ tablet Commonly known as:  K-DUR,KLOR-CON Take 1 tablet (20 mEq total) by mouth daily. Take for 7 days   PROAIR HFA 108 (90 Base) MCG/ACT inhaler Generic drug:  albuterol Inhale 2 puffs into the lungs every 4 (four) hours as needed for wheezing or shortness of breath.            Durable Medical Equipment        Start     Ordered   01/09/17 1134  For home use only DME 3 n 1  Once     01/09/17 1134     Follow-up Information    Sigmund Hazel, MD Follow up.   Specialty:  Family Medicine Contact information: [address] [place] [address] [phone]        Dorothea Ogle, MD Follow up.   Specialty:  Internal Medicine Why:  call my cell phon eiwth questions  [phone] Contact information: [address] [place] [address] [phone]        [facility] - Dme Follow up.   Why:  3n1 will be delivered to your room prior to discharge   Contact information: [address] [place] [address] [phone]            The results of significant diagnostics from this hospitalization (including imaging, microbiology, ancillary and laboratory) are listed below for reference.     Microbiology: Recent Results (from the past 240 hour(s))  Urine Culture     Status: None   Collection Time: 01/05/17  8:33 PM   Result Value Ref Range Status   Specimen Description URINE, CATHETERIZED  Final   Special Requests NONE  Final   Culture NO GROWTH  Final   Report Status 01/07/2017 FINAL  Final  Gastrointestinal Panel by PCR , Stool     Status: None   Collection Time: 01/08/17  2:45 AM  Result Value Ref Range Status   Campylobacter species NOT DETECTED NOT DETECTED Final   Plesimonas shigelloides NOT DETECTED NOT DETECTED Final   Salmonella species NOT DETECTED NOT DETECTED Final   Yersinia enterocolitica NOT DETECTED NOT DETECTED Final   Vibrio species NOT DETECTED NOT DETECTED Final   Vibrio cholerae NOT DETECTED NOT DETECTED Final   Enteroaggregative E coli (EAEC) NOT DETECTED NOT DETECTED Final   Enteropathogenic E coli (EPEC) NOT DETECTED NOT DETECTED Final   Enterotoxigenic E coli (ETEC) NOT DETECTED NOT DETECTED Final   Shiga like toxin producing E coli (STEC) NOT DETECTED NOT DETECTED Final   Shigella/Enteroinvasive E coli (EIEC) NOT DETECTED NOT DETECTED Final   Cryptosporidium NOT DETECTED NOT DETECTED Final   Cyclospora cayetanensis NOT DETECTED NOT DETECTED Final   Entamoeba histolytica NOT DETECTED NOT DETECTED Final   Giardia lamblia NOT DETECTED NOT DETECTED Final   Adenovirus F40/41 NOT DETECTED NOT DETECTED Final   Astrovirus NOT DETECTED NOT DETECTED Final   Norovirus GI/GII NOT DETECTED NOT DETECTED Final   Rotavirus A NOT DETECTED NOT DETECTED Final   Sapovirus (I, II, IV, and V) NOT DETECTED NOT DETECTED Final     Labs: Basic Metabolic Panel:  Recent Labs Lab 01/05/17 1952 01/06/17 0616 01/07/17 0533 01/08/17 0449 01/09/17 0606  NA 138 138 139 140 139  K 3.3* 3.4* 3.0* 3.2* 3.3*  CL 104 106 111 111 108  CO2 25 23 24 23 24   GLUCOSE 125* 100* 108* 93 114*  BUN 22* 17 17 13 8   CREATININE 0.74 0.58 0.53 0.51 0.50  CALCIUM 9.0 8.5* 8.2* 8.0* 8.2*   Liver Function Tests:  Recent Labs Lab 01/05/17 1952  AST 18  ALT <5*  ALKPHOS 82  BILITOT 0.9  PROT 6.4*   ALBUMIN 3.5   CBC:  Recent Labs Lab 01/05/17 1952 01/06/17 0616 01/07/17 0533 01/08/17 0449 01/09/17 0606  WBC 17.2* 12.0* 9.3 6.1 5.7  NEUTROABS 14.9*  --   --   --   --   HGB 12.1 12.2 10.1* 9.9* 10.7*  HCT 36.7 37.3 30.0* 30.0* 32.5*  MCV 93.6 92.8 91.7 93.2 93.4  PLT 214 187 184 180 225   Cardiac Enzymes:  Recent Labs Lab 01/05/17 2310  TROPONINI <0.03   CBG:  Recent  Labs Lab 01/07/17 1704  GLUCAP 114*     SIGNED: Time coordinating discharge: 60 minutes  Debbora Presto, MD  [facility] 01/09/2017, 1:50 PM Pager [phone]  If 7PM-7AM, please contact night-coverage [email or web address] Password [email or web address]

## 2017-01-09 NOTE — Progress Notes (Signed)
Brittany Crosby to be D/C'd Home per MD order.  Discussed with the patient and all questions fully answered.  VSS, Skin clean, dry and intact without evidence of skin break down, no evidence of skin tears noted. IV catheter discontinued intact. Site without signs and symptoms of complications. Dressing and pressure applied.  An After Visit Summary was printed and given to the patient. Patient received prescription. Allergies as of 01/09/2017      Reactions   Codeine Other (See Comments)   Abdominal pain   Sulfa Antibiotics Swelling   Penicillins Rash      Medication List    TAKE these medications   AEROCHAMBER MV inhaler Use as instructed   aspirin EC 81 MG tablet Take 81 mg by mouth daily.   budesonide-formoterol 160-4.5 MCG/ACT inhaler Commonly known as:  SYMBICORT Inhale 2 puffs into the lungs 2 (two) times daily.   carbidopa-levodopa 25-100 MG tablet Commonly known as:  SINEMET IR Take 1 tablet by mouth 4 (four) times daily. At 8 AM, 12, 4 PM and 8 PM   citalopram 40 MG tablet Commonly known as:  CELEXA Take 40 mg by mouth daily.   donepezil 5 MG tablet Commonly known as:  ARICEPT Take 5 mg by mouth at bedtime.   gabapentin 100 MG capsule Commonly known as:  NEURONTIN Take 2 capsules (200 mg total) by mouth at bedtime. What changed:  how much to take   latanoprost 0.005 % ophthalmic solution Commonly known as:  XALATAN Place 1 drop into both eyes at bedtime.   levofloxacin 750 MG tablet Commonly known as:  LEVAQUIN Take 1 tablet (750 mg total) by mouth daily at 6 PM.   levothyroxine 88 MCG tablet Commonly known as:  SYNTHROID, LEVOTHROID Take 44-88 mcg by mouth daily. Take on Monday, Wednesday and Friday.  Take 88 mcg all other days of the week   metroNIDAZOLE 500 MG tablet Commonly known as:  FLAGYL Take 1 tablet (500 mg total) by mouth every 8 (eight) hours.   omeprazole 20 MG capsule Commonly known as:  PRILOSEC Take 20 mg by mouth daily.    ONE-A-DAY WOMENS 50+ ADVANTAGE PO Take 1 tablet by mouth daily.   OXYGEN Inhale 2 L into the lungs at bedtime.   potassium chloride SA 20 MEQ tablet Commonly known as:  K-DUR,KLOR-CON Take 1 tablet (20 mEq total) by mouth daily. Take for 7 days   PROAIR HFA 108 (90 Base) MCG/ACT inhaler Generic drug:  albuterol Inhale 2 puffs into the lungs every 4 (four) hours as needed for wheezing or shortness of breath.            Durable Medical Equipment        Start     Ordered   01/09/17 1134  For home use only DME 3 n 1  Once     01/09/17 1134     D/c education completed with patient/family including follow up instructions, medication list, d/c activities limitations if indicated, with other d/c instructions as indicated by MD - patient able to verbalize understanding, all questions fully answered.  Patient [facility] walker and bedside commode from [facility]   Patient instructed to return to ED, call 911, or call MD for any changes in condition.   Patient escorted via WC, and D/C home via private auto.  Casper Harrison Akiem Urieta 01/09/2017 2:41 PM

## 2017-01-09 NOTE — Discharge Instructions (Signed)
Colitis Colitis is inflammation of the colon. Colitis may last a short time (acute) or it may last a long time (chronic). What are the causes? This condition may be caused by:  Viruses.  Bacteria.  Reactions to medicine.  Certain autoimmune diseases, such as Crohn disease or ulcerative colitis.  What are the signs or symptoms? Symptoms of this condition include:  Diarrhea.  Passing bloody or tarry stool.  Pain.  Fever.  Vomiting.  Tiredness (fatigue).  Weight loss.  Bloating.  Sudden increase in abdominal pain.  Having fewer bowel movements than usual.  How is this diagnosed? This condition is diagnosed with a stool test or a blood test. You may also have other tests, including X-rays, a CT scan, or a colonoscopy. How is this treated? Treatment may include:  Resting the bowel. This involves not eating or drinking for a period of time.  Fluids that are given through an IV tube.  Medicine for pain and diarrhea.  Antibiotic medicines.  Cortisone medicines.  Surgery.  Follow these instructions at home: Eating and drinking  Follow instructions from your health care provider about eating or drinking restrictions.  Drink enough fluid to keep your urine clear or pale yellow.  Work with a dietitian to determine which foods cause your condition to flare up.  Avoid foods that cause flare-ups.  Eat a well-balanced diet. Medicines  Take over-the-counter and prescription medicines only as told by your health care provider.  If you were prescribed an antibiotic medicine, take it as told by your health care provider. Do not stop taking the antibiotic even if you start to feel better. General instructions  Keep all follow-up visits as told by your health care provider. This is important. Contact a health care provider if:  Your symptoms do not go away.  You develop new symptoms. Get help right away if:  You have a fever that does not go away with  treatment.  You develop chills.  You have extreme weakness, fainting, or dehydration.  You have repeated vomiting.  You develop severe pain in your abdomen.  You pass bloody or tarry stool. This information is not intended to replace advice given to you by your health care provider. Make sure you discuss any questions you have with your health care provider. Document Released: 06/17/2004 Document Revised: 10/16/2015 Document Reviewed: 09/02/2014 Elsevier Interactive Patient Education  2018 [employer]  

## 2017-01-10 DIAGNOSIS — G20 Parkinson's disease: Secondary | ICD-10-CM | POA: Diagnosis not present

## 2017-01-10 DIAGNOSIS — H409 Unspecified glaucoma: Secondary | ICD-10-CM | POA: Diagnosis not present

## 2017-01-10 DIAGNOSIS — F329 Major depressive disorder, single episode, unspecified: Secondary | ICD-10-CM | POA: Diagnosis not present

## 2017-01-10 DIAGNOSIS — E039 Hypothyroidism, unspecified: Secondary | ICD-10-CM | POA: Diagnosis not present

## 2017-01-10 DIAGNOSIS — F0281 Dementia in other diseases classified elsewhere with behavioral disturbance: Secondary | ICD-10-CM | POA: Diagnosis not present

## 2017-01-10 DIAGNOSIS — K529 Noninfective gastroenteritis and colitis, unspecified: Secondary | ICD-10-CM | POA: Diagnosis not present

## 2017-01-10 DIAGNOSIS — J449 Chronic obstructive pulmonary disease, unspecified: Secondary | ICD-10-CM | POA: Diagnosis not present

## 2017-01-11 DIAGNOSIS — G20 Parkinson's disease: Secondary | ICD-10-CM | POA: Diagnosis not present

## 2017-01-11 DIAGNOSIS — H409 Unspecified glaucoma: Secondary | ICD-10-CM | POA: Diagnosis not present

## 2017-01-11 DIAGNOSIS — K529 Noninfective gastroenteritis and colitis, unspecified: Secondary | ICD-10-CM | POA: Diagnosis not present

## 2017-01-11 DIAGNOSIS — F329 Major depressive disorder, single episode, unspecified: Secondary | ICD-10-CM | POA: Diagnosis not present

## 2017-01-11 DIAGNOSIS — F0281 Dementia in other diseases classified elsewhere with behavioral disturbance: Secondary | ICD-10-CM | POA: Diagnosis not present

## 2017-01-11 DIAGNOSIS — J449 Chronic obstructive pulmonary disease, unspecified: Secondary | ICD-10-CM | POA: Diagnosis not present

## 2017-01-14 DIAGNOSIS — G20 Parkinson's disease: Secondary | ICD-10-CM | POA: Diagnosis not present

## 2017-01-14 DIAGNOSIS — K529 Noninfective gastroenteritis and colitis, unspecified: Secondary | ICD-10-CM | POA: Diagnosis not present

## 2017-01-14 DIAGNOSIS — H409 Unspecified glaucoma: Secondary | ICD-10-CM | POA: Diagnosis not present

## 2017-01-14 DIAGNOSIS — J449 Chronic obstructive pulmonary disease, unspecified: Secondary | ICD-10-CM | POA: Diagnosis not present

## 2017-01-14 DIAGNOSIS — F0281 Dementia in other diseases classified elsewhere with behavioral disturbance: Secondary | ICD-10-CM | POA: Diagnosis not present

## 2017-01-14 DIAGNOSIS — F329 Major depressive disorder, single episode, unspecified: Secondary | ICD-10-CM | POA: Diagnosis not present

## 2017-01-17 ENCOUNTER — Ambulatory Visit (INDEPENDENT_AMBULATORY_CARE_PROVIDER_SITE_OTHER)
Admission: RE | Admit: 2017-01-17 | Discharge: 2017-01-17 | Disposition: A | Discharge status: Home / Self Care | Payer: Medicare Other | Source: Ambulatory Visit | Attending: Acute Care | Admitting: Acute Care

## 2017-01-17 DIAGNOSIS — Z87891 Personal history of nicotine dependence: Secondary | ICD-10-CM | POA: Diagnosis not present

## 2017-01-18 DIAGNOSIS — K529 Noninfective gastroenteritis and colitis, unspecified: Secondary | ICD-10-CM | POA: Diagnosis not present

## 2017-01-18 DIAGNOSIS — H409 Unspecified glaucoma: Secondary | ICD-10-CM | POA: Diagnosis not present

## 2017-01-18 DIAGNOSIS — J449 Chronic obstructive pulmonary disease, unspecified: Secondary | ICD-10-CM | POA: Diagnosis not present

## 2017-01-18 DIAGNOSIS — G20 Parkinson's disease: Secondary | ICD-10-CM | POA: Diagnosis not present

## 2017-01-18 DIAGNOSIS — F0281 Dementia in other diseases classified elsewhere with behavioral disturbance: Secondary | ICD-10-CM | POA: Diagnosis not present

## 2017-01-18 DIAGNOSIS — F329 Major depressive disorder, single episode, unspecified: Secondary | ICD-10-CM | POA: Diagnosis not present

## 2017-01-19 DIAGNOSIS — N2889 Other specified disorders of kidney and ureter: Secondary | ICD-10-CM | POA: Diagnosis not present

## 2017-01-19 DIAGNOSIS — Z23 Encounter for immunization: Secondary | ICD-10-CM | POA: Diagnosis not present

## 2017-01-19 DIAGNOSIS — K529 Noninfective gastroenteritis and colitis, unspecified: Secondary | ICD-10-CM | POA: Diagnosis not present

## 2017-01-20 ENCOUNTER — Other Ambulatory Visit: Payer: Self-pay | Admitting: Acute Care

## 2017-01-20 DIAGNOSIS — F0281 Dementia in other diseases classified elsewhere with behavioral disturbance: Secondary | ICD-10-CM | POA: Diagnosis not present

## 2017-01-20 DIAGNOSIS — F329 Major depressive disorder, single episode, unspecified: Secondary | ICD-10-CM | POA: Diagnosis not present

## 2017-01-20 DIAGNOSIS — H409 Unspecified glaucoma: Secondary | ICD-10-CM | POA: Diagnosis not present

## 2017-01-20 DIAGNOSIS — G20 Parkinson's disease: Secondary | ICD-10-CM | POA: Diagnosis not present

## 2017-01-20 DIAGNOSIS — Z87891 Personal history of nicotine dependence: Secondary | ICD-10-CM

## 2017-01-20 DIAGNOSIS — J449 Chronic obstructive pulmonary disease, unspecified: Secondary | ICD-10-CM | POA: Diagnosis not present

## 2017-01-20 DIAGNOSIS — Z122 Encounter for screening for malignant neoplasm of respiratory organs: Secondary | ICD-10-CM

## 2017-01-20 DIAGNOSIS — K529 Noninfective gastroenteritis and colitis, unspecified: Secondary | ICD-10-CM | POA: Diagnosis not present

## 2017-01-21 DIAGNOSIS — H409 Unspecified glaucoma: Secondary | ICD-10-CM | POA: Diagnosis not present

## 2017-01-21 DIAGNOSIS — F0281 Dementia in other diseases classified elsewhere with behavioral disturbance: Secondary | ICD-10-CM | POA: Diagnosis not present

## 2017-01-21 DIAGNOSIS — J449 Chronic obstructive pulmonary disease, unspecified: Secondary | ICD-10-CM | POA: Diagnosis not present

## 2017-01-21 DIAGNOSIS — K529 Noninfective gastroenteritis and colitis, unspecified: Secondary | ICD-10-CM | POA: Diagnosis not present

## 2017-01-21 DIAGNOSIS — G20 Parkinson's disease: Secondary | ICD-10-CM | POA: Diagnosis not present

## 2017-01-21 DIAGNOSIS — F329 Major depressive disorder, single episode, unspecified: Secondary | ICD-10-CM | POA: Diagnosis not present

## 2017-01-24 DIAGNOSIS — H409 Unspecified glaucoma: Secondary | ICD-10-CM | POA: Diagnosis not present

## 2017-01-24 DIAGNOSIS — K529 Noninfective gastroenteritis and colitis, unspecified: Secondary | ICD-10-CM | POA: Diagnosis not present

## 2017-01-24 DIAGNOSIS — G20 Parkinson's disease: Secondary | ICD-10-CM | POA: Diagnosis not present

## 2017-01-24 DIAGNOSIS — F0281 Dementia in other diseases classified elsewhere with behavioral disturbance: Secondary | ICD-10-CM | POA: Diagnosis not present

## 2017-01-24 DIAGNOSIS — F329 Major depressive disorder, single episode, unspecified: Secondary | ICD-10-CM | POA: Diagnosis not present

## 2017-01-24 DIAGNOSIS — J449 Chronic obstructive pulmonary disease, unspecified: Secondary | ICD-10-CM | POA: Diagnosis not present

## 2017-01-25 ENCOUNTER — Other Ambulatory Visit: Payer: Self-pay | Admitting: Neurology

## 2017-01-25 DIAGNOSIS — J449 Chronic obstructive pulmonary disease, unspecified: Secondary | ICD-10-CM | POA: Diagnosis not present

## 2017-01-25 DIAGNOSIS — G20 Parkinson's disease: Secondary | ICD-10-CM | POA: Diagnosis not present

## 2017-01-25 DIAGNOSIS — F329 Major depressive disorder, single episode, unspecified: Secondary | ICD-10-CM | POA: Diagnosis not present

## 2017-01-25 DIAGNOSIS — K529 Noninfective gastroenteritis and colitis, unspecified: Secondary | ICD-10-CM | POA: Diagnosis not present

## 2017-01-25 DIAGNOSIS — F0281 Dementia in other diseases classified elsewhere with behavioral disturbance: Secondary | ICD-10-CM | POA: Diagnosis not present

## 2017-01-25 DIAGNOSIS — H409 Unspecified glaucoma: Secondary | ICD-10-CM | POA: Diagnosis not present

## 2017-01-26 DIAGNOSIS — J449 Chronic obstructive pulmonary disease, unspecified: Secondary | ICD-10-CM | POA: Diagnosis not present

## 2017-01-26 DIAGNOSIS — F329 Major depressive disorder, single episode, unspecified: Secondary | ICD-10-CM | POA: Diagnosis not present

## 2017-01-26 DIAGNOSIS — K529 Noninfective gastroenteritis and colitis, unspecified: Secondary | ICD-10-CM | POA: Diagnosis not present

## 2017-01-26 DIAGNOSIS — F0281 Dementia in other diseases classified elsewhere with behavioral disturbance: Secondary | ICD-10-CM | POA: Diagnosis not present

## 2017-01-26 DIAGNOSIS — H409 Unspecified glaucoma: Secondary | ICD-10-CM | POA: Diagnosis not present

## 2017-01-26 DIAGNOSIS — G20 Parkinson's disease: Secondary | ICD-10-CM | POA: Diagnosis not present

## 2017-01-27 DIAGNOSIS — F0281 Dementia in other diseases classified elsewhere with behavioral disturbance: Secondary | ICD-10-CM | POA: Diagnosis not present

## 2017-01-27 DIAGNOSIS — K529 Noninfective gastroenteritis and colitis, unspecified: Secondary | ICD-10-CM | POA: Diagnosis not present

## 2017-01-27 DIAGNOSIS — G20 Parkinson's disease: Secondary | ICD-10-CM | POA: Diagnosis not present

## 2017-01-27 DIAGNOSIS — J449 Chronic obstructive pulmonary disease, unspecified: Secondary | ICD-10-CM | POA: Diagnosis not present

## 2017-01-27 DIAGNOSIS — F329 Major depressive disorder, single episode, unspecified: Secondary | ICD-10-CM | POA: Diagnosis not present

## 2017-01-27 DIAGNOSIS — H409 Unspecified glaucoma: Secondary | ICD-10-CM | POA: Diagnosis not present

## 2017-01-28 DIAGNOSIS — H409 Unspecified glaucoma: Secondary | ICD-10-CM | POA: Diagnosis not present

## 2017-01-28 DIAGNOSIS — G20 Parkinson's disease: Secondary | ICD-10-CM | POA: Diagnosis not present

## 2017-01-28 DIAGNOSIS — F329 Major depressive disorder, single episode, unspecified: Secondary | ICD-10-CM | POA: Diagnosis not present

## 2017-01-28 DIAGNOSIS — F0281 Dementia in other diseases classified elsewhere with behavioral disturbance: Secondary | ICD-10-CM | POA: Diagnosis not present

## 2017-01-28 DIAGNOSIS — J449 Chronic obstructive pulmonary disease, unspecified: Secondary | ICD-10-CM | POA: Diagnosis not present

## 2017-01-28 DIAGNOSIS — K529 Noninfective gastroenteritis and colitis, unspecified: Secondary | ICD-10-CM | POA: Diagnosis not present

## 2017-01-31 DIAGNOSIS — W19XXXA Unspecified fall, initial encounter: Secondary | ICD-10-CM | POA: Diagnosis not present

## 2017-01-31 DIAGNOSIS — R0781 Pleurodynia: Secondary | ICD-10-CM | POA: Diagnosis not present

## 2017-02-01 DIAGNOSIS — K529 Noninfective gastroenteritis and colitis, unspecified: Secondary | ICD-10-CM | POA: Diagnosis not present

## 2017-02-01 DIAGNOSIS — H409 Unspecified glaucoma: Secondary | ICD-10-CM | POA: Diagnosis not present

## 2017-02-01 DIAGNOSIS — G20 Parkinson's disease: Secondary | ICD-10-CM | POA: Diagnosis not present

## 2017-02-01 DIAGNOSIS — F329 Major depressive disorder, single episode, unspecified: Secondary | ICD-10-CM | POA: Diagnosis not present

## 2017-02-01 DIAGNOSIS — J449 Chronic obstructive pulmonary disease, unspecified: Secondary | ICD-10-CM | POA: Diagnosis not present

## 2017-02-01 DIAGNOSIS — F0281 Dementia in other diseases classified elsewhere with behavioral disturbance: Secondary | ICD-10-CM | POA: Diagnosis not present

## 2017-02-02 DIAGNOSIS — F0281 Dementia in other diseases classified elsewhere with behavioral disturbance: Secondary | ICD-10-CM | POA: Diagnosis not present

## 2017-02-02 DIAGNOSIS — H409 Unspecified glaucoma: Secondary | ICD-10-CM | POA: Diagnosis not present

## 2017-02-02 DIAGNOSIS — J449 Chronic obstructive pulmonary disease, unspecified: Secondary | ICD-10-CM | POA: Diagnosis not present

## 2017-02-02 DIAGNOSIS — K529 Noninfective gastroenteritis and colitis, unspecified: Secondary | ICD-10-CM | POA: Diagnosis not present

## 2017-02-02 DIAGNOSIS — G20 Parkinson's disease: Secondary | ICD-10-CM | POA: Diagnosis not present

## 2017-02-02 DIAGNOSIS — F329 Major depressive disorder, single episode, unspecified: Secondary | ICD-10-CM | POA: Diagnosis not present

## 2017-02-03 ENCOUNTER — Encounter (HOSPITAL_COMMUNITY): Payer: Self-pay

## 2017-02-03 ENCOUNTER — Emergency Department (HOSPITAL_COMMUNITY): Payer: Medicare Other

## 2017-02-03 ENCOUNTER — Inpatient Hospital Stay (HOSPITAL_COMMUNITY)
Admission: EM | Admit: 2017-02-03 | Discharge: 2017-02-05 | DRG: 371 | Disposition: A | Discharge status: Home Health | Payer: Medicare Other | Attending: Internal Medicine | Admitting: Internal Medicine

## 2017-02-03 DIAGNOSIS — Z7982 Long term (current) use of aspirin: Secondary | ICD-10-CM | POA: Diagnosis not present

## 2017-02-03 DIAGNOSIS — Z79899 Other long term (current) drug therapy: Secondary | ICD-10-CM

## 2017-02-03 DIAGNOSIS — G9349 Other encephalopathy: Secondary | ICD-10-CM | POA: Diagnosis present

## 2017-02-03 DIAGNOSIS — K7682 Hepatic encephalopathy: Secondary | ICD-10-CM

## 2017-02-03 DIAGNOSIS — A419 Sepsis, unspecified organism: Secondary | ICD-10-CM | POA: Diagnosis not present

## 2017-02-03 DIAGNOSIS — E039 Hypothyroidism, unspecified: Secondary | ICD-10-CM | POA: Diagnosis present

## 2017-02-03 DIAGNOSIS — Z86010 Personal history of colonic polyps: Secondary | ICD-10-CM | POA: Diagnosis not present

## 2017-02-03 DIAGNOSIS — A0472 Enterocolitis due to Clostridium difficile, not specified as recurrent: Principal | ICD-10-CM

## 2017-02-03 DIAGNOSIS — Z8249 Family history of ischemic heart disease and other diseases of the circulatory system: Secondary | ICD-10-CM | POA: Diagnosis not present

## 2017-02-03 DIAGNOSIS — I10 Essential (primary) hypertension: Secondary | ICD-10-CM | POA: Diagnosis not present

## 2017-02-03 DIAGNOSIS — F02818 Dementia in other diseases classified elsewhere, unspecified severity, with other behavioral disturbance: Secondary | ICD-10-CM | POA: Diagnosis present

## 2017-02-03 DIAGNOSIS — K7200 Acute and subacute hepatic failure without coma: Secondary | ICD-10-CM | POA: Diagnosis not present

## 2017-02-03 DIAGNOSIS — Z87891 Personal history of nicotine dependence: Secondary | ICD-10-CM

## 2017-02-03 DIAGNOSIS — Z9181 History of falling: Secondary | ICD-10-CM

## 2017-02-03 DIAGNOSIS — N390 Urinary tract infection, site not specified: Secondary | ICD-10-CM | POA: Diagnosis not present

## 2017-02-03 DIAGNOSIS — Z66 Do not resuscitate: Secondary | ICD-10-CM | POA: Diagnosis present

## 2017-02-03 DIAGNOSIS — K529 Noninfective gastroenteritis and colitis, unspecified: Secondary | ICD-10-CM | POA: Diagnosis not present

## 2017-02-03 DIAGNOSIS — H409 Unspecified glaucoma: Secondary | ICD-10-CM | POA: Diagnosis present

## 2017-02-03 DIAGNOSIS — E860 Dehydration: Secondary | ICD-10-CM | POA: Diagnosis present

## 2017-02-03 DIAGNOSIS — Z9981 Dependence on supplemental oxygen: Secondary | ICD-10-CM

## 2017-02-03 DIAGNOSIS — F329 Major depressive disorder, single episode, unspecified: Secondary | ICD-10-CM | POA: Diagnosis present

## 2017-02-03 DIAGNOSIS — Z7951 Long term (current) use of inhaled steroids: Secondary | ICD-10-CM | POA: Diagnosis not present

## 2017-02-03 DIAGNOSIS — F0281 Dementia in other diseases classified elsewhere with behavioral disturbance: Secondary | ICD-10-CM

## 2017-02-03 DIAGNOSIS — Z87440 Personal history of urinary (tract) infections: Secondary | ICD-10-CM | POA: Diagnosis not present

## 2017-02-03 DIAGNOSIS — G20 Parkinson's disease: Secondary | ICD-10-CM | POA: Diagnosis not present

## 2017-02-03 DIAGNOSIS — J449 Chronic obstructive pulmonary disease, unspecified: Secondary | ICD-10-CM | POA: Diagnosis not present

## 2017-02-03 LAB — COMPREHENSIVE METABOLIC PANEL
ALK PHOS: 63 U/L (ref 38–126)
ALT: 12 U/L — ABNORMAL LOW (ref 14–54)
ANION GAP: 10 (ref 5–15)
AST: 25 U/L (ref 15–41)
Albumin: 3.8 g/dL (ref 3.5–5.0)
BUN: 27 mg/dL — ABNORMAL HIGH (ref 6–20)
CALCIUM: 9.1 mg/dL (ref 8.9–10.3)
CO2: 23 mmol/L (ref 22–32)
Chloride: 105 mmol/L (ref 101–111)
Creatinine, Ser: 0.5 mg/dL (ref 0.44–1.00)
GFR calc Af Amer: >60 mL/min (ref >60)
GFR calc non Af Amer: >60 mL/min (ref >60)
GLUCOSE: 112 mg/dL — AB (ref 65–99)
Potassium: 4 mmol/L (ref 3.5–5.1)
SODIUM: 138 mmol/L (ref 135–145)
Total Bilirubin: 1 mg/dL (ref 0.3–1.2)
Total Protein: 6.6 g/dL (ref 6.5–8.1)

## 2017-02-03 LAB — CBC WITH DIFFERENTIAL/PLATELET
BASOS ABS: 0 10*3/uL (ref 0.0–0.1)
Basophils Relative: 0 %
EOS ABS: 0 10*3/uL (ref 0.0–0.7)
EOS PCT: 0 %
HCT: 38.9 % (ref 36.0–46.0)
HEMOGLOBIN: 12.7 g/dL (ref 12.0–15.0)
LYMPHS ABS: 0.5 10*3/uL — AB (ref 0.7–4.0)
Lymphocytes Relative: 4 %
MCH: 31.4 pg (ref 26.0–34.0)
MCHC: 32.6 g/dL (ref 30.0–36.0)
MCV: 96.3 fL (ref 78.0–100.0)
Monocytes Absolute: 0.4 10*3/uL (ref 0.1–1.0)
Monocytes Relative: 4 %
Neutro Abs: 10.5 10*3/uL — ABNORMAL HIGH (ref 1.7–7.7)
Neutrophils Relative %: 92 %
PLATELETS: 151 10*3/uL (ref 150–400)
RBC: 4.04 MIL/uL (ref 3.87–5.11)
RDW: 13.9 % (ref 11.5–15.5)
WBC: 11.4 10*3/uL — ABNORMAL HIGH (ref 4.0–10.5)

## 2017-02-03 LAB — URINALYSIS, ROUTINE W REFLEX MICROSCOPIC
Bilirubin Urine: NEGATIVE
Glucose, UA: NEGATIVE mg/dL
HGB URINE DIPSTICK: NEGATIVE
Ketones, ur: 80 mg/dL — AB
LEUKOCYTES UA: NEGATIVE
NITRITE: NEGATIVE
PROTEIN: NEGATIVE mg/dL
SPECIFIC GRAVITY, URINE: 1.02 (ref 1.005–1.030)
pH: 5 (ref 5.0–8.0)

## 2017-02-03 LAB — C DIFFICILE QUICK SCREEN W PCR REFLEX
C DIFFICILE (CDIFF) INTERP: DETECTED
C DIFFICILE (CDIFF) TOXIN: POSITIVE — AB
C DIFFICLE (CDIFF) ANTIGEN: POSITIVE — AB

## 2017-02-03 LAB — I-STAT CG4 LACTIC ACID, ED: Lactic Acid, Venous: 1.02 mmol/L (ref 0.5–1.9)

## 2017-02-03 MED ORDER — VANCOMYCIN HCL IN DEXTROSE 1-5 GM/200ML-% IV SOLN
1000.0000 mg | Freq: Once | INTRAVENOUS | Status: AC
  Administered 2017-02-03: 1000 mg via INTRAVENOUS
  Filled 2017-02-03: qty 200

## 2017-02-03 MED ORDER — VANCOMYCIN 50 MG/ML ORAL SOLUTION
125.0000 mg | Freq: Four times a day (QID) | ORAL | Status: DC
  Administered 2017-02-03 – 2017-02-05 (×6): 125 mg via ORAL
  Filled 2017-02-03 (×7): qty 2.5

## 2017-02-03 MED ORDER — ACETAMINOPHEN 650 MG RE SUPP: 650.0000 mg | Freq: Four times a day (QID) | RECTAL | Status: DC | PRN

## 2017-02-03 MED ORDER — ONDANSETRON HCL 4 MG/2ML IJ SOLN
4.0000 mg | Freq: Four times a day (QID) | INTRAMUSCULAR | Status: DC | PRN
Start: 2017-02-03 — End: 2017-02-05
  Administered 2017-02-03: 4 mg via INTRAVENOUS
  Filled 2017-02-03: qty 2

## 2017-02-03 MED ORDER — GABAPENTIN 100 MG PO CAPS
200.0000 mg | ORAL_CAPSULE | Freq: Every day | ORAL | Status: DC
  Administered 2017-02-03 – 2017-02-04 (×2): 200 mg via ORAL
  Filled 2017-02-03 (×2): qty 2

## 2017-02-03 MED ORDER — CITALOPRAM HYDROBROMIDE 20 MG PO TABS
40.0000 mg | ORAL_TABLET | Freq: Every day | ORAL | Status: DC
  Administered 2017-02-04 – 2017-02-05 (×2): 40 mg via ORAL
  Filled 2017-02-03 (×2): qty 2

## 2017-02-03 MED ORDER — VANCOMYCIN 50 MG/ML ORAL SOLUTION
500.0000 mg | Freq: Once | ORAL | Status: AC
  Administered 2017-02-03: 500 mg via ORAL
  Filled 2017-02-03: qty 10

## 2017-02-03 MED ORDER — SODIUM CHLORIDE 0.9 % IV BOLUS (SEPSIS)
1000.0000 mL | Freq: Once | INTRAVENOUS | Status: AC
  Administered 2017-02-03: 1000 mL via INTRAVENOUS

## 2017-02-03 MED ORDER — SODIUM CHLORIDE 0.9 % IV BOLUS (SEPSIS): 30.0000 mL/kg | Freq: Once | INTRAVENOUS | Status: DC

## 2017-02-03 MED ORDER — MOMETASONE FURO-FORMOTEROL FUM 200-5 MCG/ACT IN AERO
2.0000 | INHALATION_SPRAY | Freq: Two times a day (BID) | RESPIRATORY_TRACT | Status: DC
  Administered 2017-02-03 – 2017-02-05 (×4): 2 via RESPIRATORY_TRACT
  Filled 2017-02-03: qty 8.8

## 2017-02-03 MED ORDER — LEVOTHYROXINE SODIUM 88 MCG PO TABS
44.0000 ug | ORAL_TABLET | ORAL | Status: DC
  Administered 2017-02-04: 44 ug via ORAL
  Filled 2017-02-03: qty 1

## 2017-02-03 MED ORDER — LATANOPROST 0.005 % OP SOLN
1.0000 [drp] | Freq: Every day | OPHTHALMIC | Status: DC
  Administered 2017-02-03 – 2017-02-04 (×2): 1 [drp] via OPHTHALMIC
  Filled 2017-02-03: qty 2.5

## 2017-02-03 MED ORDER — HEPARIN SODIUM (PORCINE) 5000 UNIT/ML IJ SOLN
5000.0000 [IU] | Freq: Three times a day (TID) | INTRAMUSCULAR | Status: DC
  Administered 2017-02-03 – 2017-02-05 (×5): 5000 [IU] via SUBCUTANEOUS
  Filled 2017-02-03 (×4): qty 1

## 2017-02-03 MED ORDER — LEVOTHYROXINE SODIUM 88 MCG PO TABS
88.0000 ug | ORAL_TABLET | ORAL | Status: DC
  Administered 2017-02-05: 88 ug via ORAL
  Filled 2017-02-03: qty 1

## 2017-02-03 MED ORDER — ONDANSETRON HCL 4 MG PO TABS: 4.0000 mg | ORAL_TABLET | Freq: Four times a day (QID) | ORAL | Status: DC | PRN

## 2017-02-03 MED ORDER — ASPIRIN EC 81 MG PO TBEC
81.0000 mg | DELAYED_RELEASE_TABLET | Freq: Every day | ORAL | Status: DC
  Administered 2017-02-04 – 2017-02-05 (×2): 81 mg via ORAL
  Filled 2017-02-03 (×2): qty 1

## 2017-02-03 MED ORDER — METRONIDAZOLE IN NACL 5-0.79 MG/ML-% IV SOLN
500.0000 mg | Freq: Once | INTRAVENOUS | Status: AC
  Administered 2017-02-03: 500 mg via INTRAVENOUS
  Filled 2017-02-03: qty 100

## 2017-02-03 MED ORDER — SODIUM CHLORIDE 0.9 % IV SOLN: 1000.0000 mL | INTRAVENOUS | Status: DC

## 2017-02-03 MED ORDER — LEVOTHYROXINE SODIUM 88 MCG PO TABS: 44.0000 ug | ORAL_TABLET | Freq: Every day | ORAL | Status: DC

## 2017-02-03 MED ORDER — ALBUTEROL SULFATE HFA 108 (90 BASE) MCG/ACT IN AERS
2.0000 | INHALATION_SPRAY | RESPIRATORY_TRACT | Status: DC | PRN
Start: 2017-02-03 — End: 2017-02-03

## 2017-02-03 MED ORDER — DONEPEZIL HCL 10 MG PO TABS
5.0000 mg | ORAL_TABLET | Freq: Every day | ORAL | Status: DC
  Administered 2017-02-03 – 2017-02-04 (×2): 5 mg via ORAL
  Filled 2017-02-03 (×2): qty 1

## 2017-02-03 MED ORDER — SODIUM CHLORIDE 0.9 % IV SOLN
INTRAVENOUS | Status: AC
  Administered 2017-02-03: 21:00:00 via INTRAVENOUS

## 2017-02-03 MED ORDER — CARBIDOPA-LEVODOPA 25-100 MG PO TABS
1.0000 | ORAL_TABLET | Freq: Four times a day (QID) | ORAL | Status: DC
  Administered 2017-02-03 – 2017-02-05 (×6): 1 via ORAL
  Filled 2017-02-03 (×6): qty 1

## 2017-02-03 MED ORDER — ACETAMINOPHEN 325 MG PO TABS: 650.0000 mg | ORAL_TABLET | Freq: Four times a day (QID) | ORAL | Status: DC | PRN

## 2017-02-03 MED ORDER — VANCOMYCIN 50 MG/ML ORAL SOLUTION
500.0000 mg | Freq: Four times a day (QID) | ORAL | Status: DC
  Filled 2017-02-03 (×2): qty 10

## 2017-02-03 MED ORDER — ALBUTEROL SULFATE (2.5 MG/3ML) 0.083% IN NEBU: 2.5000 mg | INHALATION_SOLUTION | RESPIRATORY_TRACT | Status: DC | PRN

## 2017-02-03 NOTE — ED Notes (Signed)
Patient was soiled with BM upon arrival

## 2017-02-03 NOTE — Progress Notes (Signed)
[facility]  Patient Status: Active (receiving services up to time of hospitalization)  [facility] is providing the following services: ST  If patient discharges after hours, please call [phone].   Avie EchevariaKaren Nussbaum 02/03/2017, 2:01 PM

## 2017-02-03 NOTE — ED Notes (Signed)
Pt has soiled herself and has been cleaned >4 times.

## 2017-02-03 NOTE — ED Notes (Signed)
Bed: [id number] Expected date:  Expected time:  Means of arrival:  Comments: EMS 76 y/o UTI

## 2017-02-03 NOTE — ED Triage Notes (Signed)
Per EMS, pt is coming from home after husband called and complained of AMS and burning with urination. Pt has hx of dementia and COPD.

## 2017-02-03 NOTE — H&P (Addendum)
History and Physical    Brittany Crosby ZOX:[id number] DOB: Sep 21, 1940 DOA: 02/03/2017  PCP: Sigmund Hazel, MD  Patient coming from: home  I have personally briefly reviewed patient's old medical records in [facility]  Chief Complaint: Diarrhea  HPI: Brittany Crosby is a 76 y.o. female with medical history significant of past medical history of dementia and Parkinson, COPD 2 L oxygen dependent, recently discharged from the hospital on 01/09/2017 on oral Levaquin for infectious colitis history from the patient is limited due to her dementia, there is nobody at bedside, so most of the information was obtained from the records, which related that her husband called EMS as she had foul-smelling urine multiple watery liquid stools and altered mental status, when EMS got to the house he had a temperature 101 with a heart rate in the low 100s, EMS started fluid resuscitation. As per chart her husband relate that she's had more than 10 bowel movements in a day for the last 3 days.Marland Kitchen He relates no blood. He relates she's been having chills at home, denies any nausea or vomiting.  ED Course:  Vital signs remain stable blood pressure is stable at 120/57 with a heart rate in the 90s, she was fluid resuscitated with 3 makes poor K by the emergency room physician: Basic metabolic panel shows a sodium 811 potassium 4.0 and a creatinine of 0.5, she has a mild leukocytosis of 11.4 with a left shift, rectal tube had to be placed at multiple bowel movements she was having in the ED. C. difficile PCR was positive for antigen and toxin  Review of Systems: As per HPI otherwise 10 point review of systems negative.   Past Medical History:  Diagnosis Date  . Anxiety   . Colon polyps   . COPD (chronic obstructive pulmonary disease) (HCC)   . Disorder of bone and cartilage, unspecified   . Glaucoma   . Glaucoma    unspecified  . H/O falling   . Hiatal hernia   . Hypercholesteremia   . Major depressive  disorder, single episode, unspecified   . Nicotine dependence    history  . On home oxygen therapy    "2L at night when she sleeps" (01/06/2017)  . Thyroid disease hypothyroidism    Past Surgical History:  Procedure Laterality Date  . ABDOMINAL HYSTERECTOMY    . APPENDECTOMY    . BACK SURGERY    . BLADDER AUGMENTATION    . CHOLECYSTECTOMY    . TONSILLECTOMY       reports that she quit smoking about 5 years ago. Her smoking use included Cigarettes. She has a 37.50 pack-year smoking history. She has never used smokeless tobacco. She reports that she does not drink alcohol or use drugs.  Allergies  Allergen Reactions  . Codeine Other (See Comments)    Abdominal pain  . Sulfa Antibiotics Swelling  . Penicillins Rash    Family History  Problem Relation Age of Onset  . Dementia Mother   . Heart attack Father     Prior to Admission medications   Medication Sig Start Date End Date Taking? Authorizing Provider  aspirin EC 81 MG tablet Take 81 mg by mouth daily.   Yes [provider]  budesonide-formoterol (SYMBICORT) 160-4.5 MCG/ACT inhaler Inhale 2 puffs into the lungs 2 (two) times daily. 03/10/16  Yes Lupita Leash, MD  carbidopa-levodopa (SINEMET IR) 25-100 MG tablet Take 1 tablet by mouth 4 (four) times daily. At 8 AM, 12, 4 PM and 8  PM 09/14/16  Yes Huston Foley, MD  citalopram (CELEXA) 40 MG tablet Take 40 mg by mouth daily. 11/08/14  Yes [provider]  donepezil (ARICEPT) 5 MG tablet Take 5 mg by mouth at bedtime.   Yes [provider]  latanoprost (XALATAN) 0.005 % ophthalmic solution Place 1 drop into both eyes at bedtime.   Yes [provider]  levothyroxine (SYNTHROID, LEVOTHROID) 88 MCG tablet Take 44-88 mcg by mouth daily. Take on Monday, Wednesday and Friday.  Take 88 mcg all other days of the week   Yes [provider]  Multiple Vitamins-Minerals (ONE-A-DAY WOMENS 50+ ADVANTAGE PO) Take 1 tablet by mouth daily.    Yes [provider]  omeprazole (PRILOSEC) 20 MG capsule Take 20 mg by mouth daily.   Yes [provider]  OXYGEN Inhale 2 L into the lungs at bedtime.   Yes [provider]  PROAIR HFA 108 (90 BASE) MCG/ACT inhaler Inhale 2 puffs into the lungs every 4 (four) hours as needed for wheezing or shortness of breath.  04/29/14  Yes [provider]  gabapentin (NEURONTIN) 100 MG capsule Take 2 capsules (200 mg total) by mouth at bedtime. Patient not taking: Reported on 02/03/2017 09/14/16   Huston Foley, MD  levofloxacin (LEVAQUIN) 750 MG tablet Take 1 tablet (750 mg total) by mouth daily at 6 PM. Patient not taking: Reported on 02/03/2017 01/09/17   Dorothea Ogle, MD  metroNIDAZOLE (FLAGYL) 500 MG tablet Take 1 tablet (500 mg total) by mouth every 8 (eight) hours. Patient not taking: Reported on 02/03/2017 01/09/17   Dorothea Ogle, MD  potassium chloride SA (K-DUR,KLOR-CON) 20 MEQ tablet Take 1 tablet (20 mEq total) by mouth daily. Take for 7 days Patient not taking: Reported on 02/03/2017 01/09/17   Dorothea Ogle, MD  Spacer/Aero-Holding Chambers (AEROCHAMBER MV) inhaler Use as instructed 01/01/14   Lupita Leash, MD    Physical Exam: Vitals:   02/03/17 1221 02/03/17 1242 02/03/17 1500 02/03/17 1530  BP:  (!) 120/59 128/63 (!) 121/57  Pulse:  90 92 92  Resp:  (!) 22 17 (!) 21  Temp:  99.8 F (37.7 C)    TempSrc:  Oral    SpO2: 99% 99% 97% 97%    Constitutional: NAD, calm, comfortable Vitals:   02/03/17 1221 02/03/17 1242 02/03/17 1500 02/03/17 1530  BP:  (!) 120/59 128/63 (!) 121/57  Pulse:  90 92 92  Resp:  (!) 22 17 (!) 21  Temp:  99.8 F (37.7 C)    TempSrc:  Oral    SpO2: 99% 99% 97% 97%   Eyes: PERRL, lids and conjunctivae normal ENMT: Mucous membranes are moist. Posterior pharynx clear of any exudate or lesions.Normal dentition.  Neck: normal, supple, no masses, no thyromegaly Respiratory: clear to auscultation bilaterally, no wheezing, no  crackles.  Cardiovascular: Regular rate and rhythm S1-S2 no murmurs or gallops..  Abdomen: Positive bowel sounds with diffuse tenderness no rebound or guarding. Musculoskeletal: no clubbing / cyanosis. No joint deformity upper and lower extremities. Good ROM, no contractures. Normal muscle tone.  Skin: No rashes or ulcerations. Neurologic: CN 2-12 grossly intact. Sensation intact, DTR normal. Strength 5/5 in all 4.    Labs on Admission: I have personally reviewed following labs and imaging studies  CBC:  Recent Labs Lab 02/03/17 1354  WBC 11.4*  NEUTROABS 10.5*  HGB 12.7  HCT 38.9  MCV 96.3  PLT 151   Basic Metabolic Panel:  Recent Labs  Lab 02/03/17 1354  NA 138  K 4.0  CL 105  CO2 23  GLUCOSE 112*  BUN 27*  CREATININE 0.50  CALCIUM 9.1   GFR: CrCl cannot be calculated (Unknown ideal weight.). Liver Function Tests:  Recent Labs Lab 02/03/17 1354  AST 25  ALT 12*  ALKPHOS 63  BILITOT 1.0  PROT 6.6  ALBUMIN 3.8   No results for input(s): LIPASE, AMYLASE in the last 168 hours. No results for input(s): AMMONIA in the last 168 hours. Coagulation Profile: No results for input(s): INR, PROTIME in the last 168 hours. Cardiac Enzymes: No results for input(s): CKTOTAL, CKMB, CKMBINDEX, TROPONINI in the last 168 hours. BNP (last 3 results) No results for input(s): PROBNP in the last 8760 hours. HbA1C: No results for input(s): HGBA1C in the last 72 hours. CBG: No results for input(s): GLUCAP in the last 168 hours. Lipid Profile: No results for input(s): CHOL, HDL, LDLCALC, TRIG, CHOLHDL, LDLDIRECT in the last 72 hours. Thyroid Function Tests: No results for input(s): TSH, T4TOTAL, FREET4, T3FREE, THYROIDAB in the last 72 hours. Anemia Panel: No results for input(s): VITAMINB12, FOLATE, FERRITIN, TIBC, IRON, RETICCTPCT in the last 72 hours. Urine analysis:    Component Value Date/Time   COLORURINE AMBER (A) 02/03/2017 1626   APPEARANCEUR HAZY (A) 02/03/2017  1626   LABSPEC 1.020 02/03/2017 1626   PHURINE 5.0 02/03/2017 1626   GLUCOSEU NEGATIVE 02/03/2017 1626   HGBUR NEGATIVE 02/03/2017 1626   BILIRUBINUR NEGATIVE 02/03/2017 1626   KETONESUR 80 (A) 02/03/2017 1626   PROTEINUR NEGATIVE 02/03/2017 1626   NITRITE NEGATIVE 02/03/2017 1626   LEUKOCYTESUR NEGATIVE 02/03/2017 1626    Radiological Exams on Admission: Dg Chest 2 View  Result Date: 02/03/2017 CLINICAL DATA:  Sepsis. EXAM: CHEST  2 VIEW COMPARISON:  January 31, 2017. FINDINGS: The cardiomediastinal silhouette is normal in size. Normal pulmonary vascularity. Unchanged lung hyperinflation with emphysematous changes in the bilateral upper lobes. No focal consolidation, pleural effusion, or pneumothorax. No acute osseous abnormality. IMPRESSION: COPD.  No active cardiopulmonary disease. Electronically Signed   By: Obie DredgeWilliam T Derry M.D.   On: 02/03/2017 16:43    EKG: Independently reviewed. Sinus rhythm no T wave abnormalities poor R-wave progression.  Assessment/Plan C. difficile colitis: Likely due to recent exposure to Levaquin. We'll put her on isolation precaution, started on oral Vanc. Continue IV fluids, I agree with fluid resuscitation. I agree with rectal tube can probably dc in the morning. We'll place a Foley.  Acute encephalopathy: According to notes and the patient has been she's been confused likely due to hypovolemia and or infectious etiology. Well-hydrated and reevaluate in the morning. There has been no history of falls.  COPD, severe (HCC) Stable wheezing on physical exam, continue oxygen.  Dementia due to Parkinson's disease with behavioral disturbance (HCC) Continue Namenda carbidopa and all other neuropsychiatric medications.  Essential hypertension She is on no activity as her medications at home, we'll continue to monitor.  Hypothyroidism Continue Synthroid.   DVT prophylaxis: Lovenox Code Status: full Family Communication: none Disposition Plan:    Consults called: none Admission status: inpatient   Marinda ElkFELIZ ORTIZ, ABRAHAM MD [facility] Pager [phone]  If 7PM-7AM, please contact night-coverage [email or web address] Password [email or web address]  02/03/2017, 5:57 PM

## 2017-02-03 NOTE — ED Notes (Signed)
Attempted to call report. Nurse not available at this time.

## 2017-02-03 NOTE — ED Provider Notes (Signed)
[facility]-EMERGENCY DEPT Provider Note   CSN: [id number] Arrival date & time: 02/03/17  1211     History   Chief Complaint Diarrhea Altered Mental Status  HPI  Brittany Crosby is a 76 y.o. female history of dementia, Parkinson's and COPD, who presents via EMS after husband called concerned with altered mental status, odor to urine, and loose stools. EMS reports that when they arrived at the residence home health nurse reported a fever of 101 and was tachycardic to the low 100s during transport, patient received 250 mL fluid bolus en route. EMS reports husband was concerned because her mental status seemed to be altered from her baseline, and she seemed to be weaker and having more trouble with her gait than usual. She has a history of UTIs in the past and has had multiple bouts of sepsis. Patient has had multiple episodes of incontinence of her bowels since EMS arrived. Patient was discharged from the hospital on 8/19 after admission for sepsis with colitis.   History from patient is limited due to dementia, but patient reports she fell at home today, she does not think she hit her head, she did not lose consciousness, she reports she fell onto her left side and has some pain there, denies pain at the hip. Patient is not currently on blood thinners. Patient reports she's had some fevers and chills at home, is unable to recall measured temperature. Patient reports some general abdominal pain, denies nausea or vomiting but reports diarrhea for the past 3 days. Patient denies burning with urination or increased urinary frequency. Patient denies chest pain or shortness of breath, patient reports she uses 2 L of oxygen at home only at night when she sleeping. Patient denies any pain in her joints.        Past Medical History:  Diagnosis Date  . Anxiety   . Colon polyps   . COPD (chronic obstructive pulmonary disease) (HCC)   . Disorder of bone and cartilage, unspecified   . Glaucoma   .  Glaucoma    unspecified  . H/O falling   . Hiatal hernia   . Hypercholesteremia   . Major depressive disorder, single episode, unspecified   . Nicotine dependence    history  . On home oxygen therapy    "2L at night when she sleeps" (01/06/2017)  . Thyroid disease hypothyroidism    Patient Active Problem List   Diagnosis Date Noted  . Dementia due to Parkinson's disease with behavioral disturbance (HCC) 01/06/2017  . Parkinson disease (HCC) 01/06/2017  . Essential hypertension 01/06/2017  . Hypothyroidism 01/06/2017  . Colitis 01/05/2017  . Fatigue 04/22/2016  . Nocturnal hypoxemia 04/22/2016  . COPD, severe (HCC) 01/01/2014  . Glaucoma 01/01/2014  . Tobacco abuse 01/01/2014    Past Surgical History:  Procedure Laterality Date  . ABDOMINAL HYSTERECTOMY    . APPENDECTOMY    . BACK SURGERY    . BLADDER AUGMENTATION    . CHOLECYSTECTOMY    . TONSILLECTOMY      OB History    No data available       Home Medications    Prior to Admission medications   Medication Sig Start Date End Date Taking? Authorizing Provider  aspirin EC 81 MG tablet Take 81 mg by mouth daily.    [provider]  budesonide-formoterol (SYMBICORT) 160-4.5 MCG/ACT inhaler Inhale 2 puffs into the lungs 2 (two) times daily. 03/10/16   Lupita Leash, MD  carbidopa-levodopa (SINEMET IR) 25-100 MG tablet Take  1 tablet by mouth 4 (four) times daily. At 8 AM, 12, 4 PM and 8 PM 09/14/16   Huston FoleyAthar, Saima, MD  citalopram (CELEXA) 40 MG tablet Take 40 mg by mouth daily. 11/08/14   [provider]  donepezil (ARICEPT) 5 MG tablet Take 5 mg by mouth at bedtime.    [provider]  gabapentin (NEURONTIN) 100 MG capsule Take 2 capsules (200 mg total) by mouth at bedtime. Patient taking differently: Take 100 mg by mouth at bedtime.  09/14/16   Huston FoleyAthar, Saima, MD  latanoprost (XALATAN) 0.005 % ophthalmic solution Place 1 drop into both eyes at bedtime.    [provider]    levofloxacin (LEVAQUIN) 750 MG tablet Take 1 tablet (750 mg total) by mouth daily at 6 PM. 01/09/17   Dorothea OgleMyers, Iskra M, MD  levothyroxine (SYNTHROID, LEVOTHROID) 88 MCG tablet Take 44-88 mcg by mouth daily. Take 44mcg on Monday, Wednesday and Friday.  Take 88 mcg all other days of the week    [provider]  metroNIDAZOLE (FLAGYL) 500 MG tablet Take 1 tablet (500 mg total) by mouth every 8 (eight) hours. 01/09/17   Dorothea OgleMyers, Iskra M, MD  Multiple Vitamins-Minerals (ONE-A-DAY WOMENS 50+ ADVANTAGE PO) Take 1 tablet by mouth daily.    [provider]  omeprazole (PRILOSEC) 20 MG capsule Take 20 mg by mouth daily.    [provider]  OXYGEN Inhale 2 L into the lungs at bedtime.    [provider]  potassium chloride SA (K-DUR,KLOR-CON) 20 MEQ tablet Take 1 tablet (20 mEq total) by mouth daily. Take for 7 days 01/09/17   Dorothea OgleMyers, Iskra M, MD  [facility] HFA 108 [phone](90 BASE) MCG/ACT inhaler Inhale 2 puffs into the lungs every 4 (four) hours as needed for wheezing or shortness of breath.  04/29/14   [provider]  Spacer/Aero-Holding Chambers (AEROCHAMBER MV) inhaler Use as instructed 01/01/14   Lupita LeashMcQuaid, Douglas B, MD    Family History Family History  Problem Relation Age of Onset  . Dementia Mother   . Heart attack Father     Social History Social History  Substance Use Topics  . Smoking status: Former Smoker    Packs/day: 1.25    Years: 30.00    Types: Cigarettes    Quit date: 11/22/2011  . Smokeless tobacco: Never Used     Comment: Remains smoke free  . Alcohol use No     Allergies   Codeine; Sulfa antibiotics; and Penicillins   Review of Systems Review of Systems  Constitutional: Positive for chills and fever.  HENT: Negative for congestion, rhinorrhea and sore throat.   Eyes: Negative for photophobia and visual disturbance.  Respiratory: Negative for cough, chest tightness and shortness of breath.   Cardiovascular: Negative for chest pain.   Gastrointestinal: Positive for abdominal pain and diarrhea. Negative for nausea and vomiting.  Genitourinary: Negative for difficulty urinating and dysuria.  Musculoskeletal: Negative for arthralgias.  Skin: Negative for rash.  Neurological: Positive for weakness. Negative for dizziness, numbness and headaches.     Physical Exam Updated Vital Signs BP (!) 120/59 (BP Location: Right Arm)   Pulse 90   Temp 99.8 F (37.7 C) (Oral)   Resp (!) 22   SpO2 99%   Physical Exam  Constitutional: She appears well-developed and well-nourished. No distress.  HENT:  Head: Normocephalic and atraumatic.  Eyes: Pupils are equal, round, and reactive to light. EOM are normal. Right eye exhibits no discharge. Left eye exhibits no discharge.  Cardiovascular: Normal rate, regular rhythm, normal heart sounds and intact distal pulses.   Pulmonary/Chest: Effort normal and breath sounds normal. No respiratory distress.  Mild tachypnea, normal effort, no accessory muscle use, no wheezes or rales noted  Abdominal: Soft.  Bowel sounds hyperactive, abdomen soft, TTP throughout,   Neurological: She is alert. Coordination normal.  Speech is clear, able to follow commands CN III-XII intact Normal strength in upper and lower extremities bilaterally including dorsiflexion and plantar flexion, strong and equal grip strength Sensation normal to light and sharp touch    Skin: She is not diaphoretic.  Psychiatric: She has a normal mood and affect. Her behavior is normal.  Nursing note and vitals reviewed.    ED Treatments / Results  Labs (all labs ordered are listed, but only abnormal results are displayed) Labs Reviewed  COMPREHENSIVE METABOLIC PANEL - Abnormal; Notable for the following:       Result Value   Glucose, Bld 112 (*)    BUN 27 (*)    ALT 12 (*)    All other components within normal limits  CBC WITH DIFFERENTIAL/PLATELET - Abnormal; Notable for the following:    WBC 11.4 (*)    Neutro Abs  10.5 (*)    Lymphs Abs 0.5 (*)    All other components within normal limits  CULTURE, BLOOD (ROUTINE X 2)  CULTURE, BLOOD (ROUTINE X 2)  GASTROINTESTINAL PANEL BY PCR, STOOL (REPLACES STOOL CULTURE)  C DIFFICILE QUICK SCREEN W PCR REFLEX  URINALYSIS, ROUTINE W REFLEX MICROSCOPIC  I-STAT CG4 LACTIC ACID, ED    EKG  EKG Interpretation None       Radiology Dg Chest 2 View  Result Date: 02/03/2017 CLINICAL DATA:  Sepsis. EXAM: CHEST  2 VIEW COMPARISON:  January 31, 2017. FINDINGS: The cardiomediastinal silhouette is normal in size. Normal pulmonary vascularity. Unchanged lung hyperinflation with emphysematous changes in the bilateral upper lobes. No focal consolidation, pleural effusion, or pneumothorax. No acute osseous abnormality. IMPRESSION: COPD.  No active cardiopulmonary disease. Electronically Signed   By: Obie Dredge M.D.   On: 02/03/2017 16:43    Procedures Procedures (including critical care time)  Medications Ordered in ED Medications - No data to display  Medications  sodium chloride 0.9 % bolus 1,671 mL (not administered)  mometasone-formoterol (DULERA) 200-5 MCG/ACT inhaler 2 puff (2 puffs Inhalation Given 02/03/17 2142)  0.9 %  sodium chloride infusion ( Intravenous New Bag/Given 02/03/17 2111)  vancomycin (VANCOCIN) IVPB 1000 mg/200 mL premix (0 mg Intravenous Stopped 02/03/17 1756)  sodium chloride 0.9 % bolus 1,000 mL (1,000 mLs Intravenous New Bag/Given 02/03/17 1647)  metroNIDAZOLE (FLAGYL) IVPB 500 mg (0 mg Intravenous Stopped 02/03/17 2035)      Initial Impression / Assessment and Plan / ED Course  I have reviewed the triage vital signs and the nursing notes.  Pertinent labs & imaging results that were available during my care of the patient were reviewed by me and considered in my medical decision making (see chart for details).  Patient presents with diarrhea and weakness, Family reports a fever of 101 obtained at home just prior to arrival, on  initial eval patient is mildly tachypneic, pulse in the mid 90s, afebrile here, and she is not hypotensive. Concern for sepsis as well as C. difficile, given recent treatment with Levaquin and Flagyl for infectious colitis. Initial labs show mild leukocytosis, patient discussed with Dr. Donnald Garre, will start fluids and IV vancomycin empirically, awaiting stool studies, and chest x-ray to identify infection source.  Patient is not hypotensive and lactic acid is WNL, 30 ml/kg fluid bolus not warranted at this time.  Patient is having profuse diarrhea since arriving to the emergency Department, very high suspicion for C. Difficile will add Flagyl to antibiotic regimen. Rectal tube placed.  Patient is positive for C. Diff, placed on enteric precautions. No evidence of urinary tract infection or pneumonia, altered mental status may be due to dehydration, continue fluids. No electrolyte derangements.   Patient warrants admission for C. Diff treatment, Patient discussed with Dr. Donnald Garre, who saw patient as well and agrees with plan. Hospitalist service consulted for admission, Dr. David Stall  who will see and admit patient.  . Final Clinical Impressions(s) / ED Diagnoses   Final diagnoses:  Enteritis due to Clostridium difficile  Dehydration    New Prescriptions New Prescriptions   No medications on file     Legrand Rams 02/04/17 1610    Arby Barrette, MD 02/04/17 [phone]

## 2017-02-03 NOTE — ED Notes (Signed)
Date and time results received: 02/03/17 1630   Test: C diff Critical Value: positive  Name of Provider Notified: Donnald GarrePfeiffer, MD  Orders Received? Or Actions Taken?: Isolation orders placed. Orders to treat already present.

## 2017-02-04 LAB — GASTROINTESTINAL PANEL BY PCR, STOOL (REPLACES STOOL CULTURE)

## 2017-02-04 LAB — CBC
HEMATOCRIT: 33.3 % — AB (ref 36.0–46.0)
Hemoglobin: 11.2 g/dL — ABNORMAL LOW (ref 12.0–15.0)
MCH: 32.2 pg (ref 26.0–34.0)
MCHC: 33.6 g/dL (ref 30.0–36.0)
MCV: 95.7 fL (ref 78.0–100.0)
PLATELETS: 147 10*3/uL — AB (ref 150–400)
RBC: 3.48 MIL/uL — ABNORMAL LOW (ref 3.87–5.11)
RDW: 14 % (ref 11.5–15.5)
WBC: 7.3 10*3/uL (ref 4.0–10.5)

## 2017-02-04 LAB — BASIC METABOLIC PANEL
ANION GAP: 7 (ref 5–15)
BUN: 20 mg/dL (ref 6–20)
CO2: 23 mmol/L (ref 22–32)
CREATININE: 0.47 mg/dL (ref 0.44–1.00)
Calcium: 8.1 mg/dL — ABNORMAL LOW (ref 8.9–10.3)
Chloride: 109 mmol/L (ref 101–111)
Glucose, Bld: 91 mg/dL (ref 65–99)
Potassium: 3.1 mmol/L — ABNORMAL LOW (ref 3.5–5.1)
Sodium: 139 mmol/L (ref 135–145)

## 2017-02-04 MED ORDER — POTASSIUM CHLORIDE CRYS ER 20 MEQ PO TBCR
40.0000 meq | EXTENDED_RELEASE_TABLET | Freq: Two times a day (BID) | ORAL | Status: AC
  Administered 2017-02-04 (×2): 40 meq via ORAL
  Filled 2017-02-04 (×2): qty 2

## 2017-02-04 MED ORDER — POTASSIUM CHLORIDE CRYS ER 10 MEQ PO TBCR
30.0000 meq | EXTENDED_RELEASE_TABLET | Freq: Once | ORAL | Status: AC
  Administered 2017-02-04: 08:00:00 30 meq via ORAL
  Filled 2017-02-04: qty 1

## 2017-02-04 NOTE — Care Management Note (Signed)
Case Management Note  Patient Details  Name: Denelle Capurro MRN: [id number] Date of Birth: January 14, 1941  Subjective/Objective:                  c-diff colitis  Action/Plan: Date:  February 04, 2017 Chart reviewed for concurrent status and case management needs.  Will continue to follow patient progress.  Discharge Planning: following for needs  Expected discharge date: [id number]  Marcelle Smiling, BSN, [place], [place]   [phone]   Expected Discharge Date:   (unknown)               Expected Discharge Plan:  Home/Self Care  In-House Referral:     Discharge planning Services  CM Consult  Post Acute Care Choice:    Choice offered to:     DME Arranged:    DME Agency:     HH Arranged:    HH Agency:     Status of Service:  In process, will continue to follow  If discussed at Long Length of Stay Meetings, dates discussed:    Additional Comments:  Golda Acre, RN 02/04/2017, 9:29 AM

## 2017-02-04 NOTE — Progress Notes (Signed)
[facility] PROGRESS NOTE    Progress Note  Brittany Crosby  JYN:[id number] DOB: [birth year] DOA: 02/03/2017 PCP: Sigmund Hazel, MD     Brief Narrative:   Brittany Crosby is an 76 y.o. female past medical history of dementia oxygen dependent COPD recently discharged from the hospital on Levaquin comes in with C. difficile colitis.  Assessment/Plan:   C. difficile colitis: Likely due to antibiotics. Keep patient in context continue oral vancomycin. Discontinue Foley. Physical therapy consulted.  Acute encephalopathy: Slowly improving.  Severe COPD: Continue oxygen seems to be at baseline.  Dementia due to Parkinson's disease with behavioral disturbances: continue current medications.  Essential hypertension: Blood pressure seems to be stable off her antihypertensive medication.    DVT prophylaxis: lovenox Family Communication:none Disposition Plan/Barrier to D/C: home once stool form Code Status:     Code Status Orders        Start     Ordered   02/03/17 2101  Do not attempt resuscitation (DNR)  Continuous    Question Answer Comment  In the event of cardiac or respiratory ARREST Do not call a "code blue"   In the event of cardiac or respiratory ARREST Do not perform Intubation, CPR, defibrillation or ACLS   In the event of cardiac or respiratory ARREST Use medication by any route, position, wound care, and other measures to relive pain and suffering. May use oxygen, suction and manual treatment of airway obstruction as needed for comfort.      02/03/17 2100    Code Status History    Date Active Date Inactive Code Status Order ID Comments User Context   01/06/2017  3:01 AM 01/09/2017  5:56 PM DNR [id number]  Alberteen Sam, MD Inpatient    Advance Directive Documentation     Most Recent Value  Type of Advance Directive  Healthcare Power of Attorney  Pre-existing out of facility DNR order (yellow form or pink MOST form)  -  "MOST" Form in Place?   -        IV Access:    Peripheral IV   Procedures and diagnostic studies:   Dg Chest 2 View  Result Date: 02/03/2017 CLINICAL DATA:  Sepsis. EXAM: CHEST  2 VIEW COMPARISON:  January 31, 2017. FINDINGS: The cardiomediastinal silhouette is normal in size. Normal pulmonary vascularity. Unchanged lung hyperinflation with emphysematous changes in the bilateral upper lobes. No focal consolidation, pleural effusion, or pneumothorax. No acute osseous abnormality. IMPRESSION: COPD.  No active cardiopulmonary disease. Electronically Signed   By: Obie Dredge M.D.   On: 02/03/2017 16:43     Medical Consultants:    None.  Anti-Infectives:   Oral vanc  Subjective:    Jazzy Roll sugar she feels great better than yesterday would like to eat.  Objective:    Vitals:   02/03/17 2108 02/03/17 2142 02/03/17 2159 02/04/17 0505  BP:   (!) 118/57 (!) 105/59  Pulse:   87 77  Resp:   20 18  Temp:   98.7 F (37.1 C) 98.7 F (37.1 C)  TempSrc:   Oral Oral  SpO2:  95% 98% 92%  Weight: 55.7 kg (122 lb 12.7 oz)       Intake/Output Summary (Last 24 hours) at 02/04/17 0737 Last data filed at 02/04/17 0600  Gross per 24 hour  Intake           440.83 ml  Output              300 ml  Net  140.83 ml   Filed Weights   02/03/17 2108  Weight: 55.7 kg (122 lb 12.7 oz)    Exam: General exam: In no acute distress. Respiratory system: Good air movement and clear to auscultation. Cardiovascular system:Regular in rhythm with positive S1-S2. Gastrointestinal system: Positive bowel sounds soft nontender nondistended.  Central nervous system: Alert awake and oriented 3. Extremities: No pedal edema. Skin: No rashes, lesions or ulcers   Data Reviewed:    Labs: Basic Metabolic Panel:  Recent Labs Lab 02/03/17 1354 02/04/17 0553  NA 138 139  K 4.0 3.1*  CL 105 109  CO2 23 23  GLUCOSE 112* 91  BUN 27* 20  CREATININE 0.50 0.47  CALCIUM 9.1 8.1*    GFR Estimated Creatinine Clearance: 51 mL/min (by C-G formula based on SCr of 0.47 mg/dL). Liver Function Tests:  Recent Labs Lab 02/03/17 1354  AST 25  ALT 12*  ALKPHOS 63  BILITOT 1.0  PROT 6.6  ALBUMIN 3.8   No results for input(s): LIPASE, AMYLASE in the last 168 hours. No results for input(s): AMMONIA in the last 168 hours. Coagulation profile No results for input(s): INR, PROTIME in the last 168 hours.  CBC:  Recent Labs Lab 02/03/17 1354 02/04/17 0553  WBC 11.4* 7.3  NEUTROABS 10.5*  --   HGB 12.7 11.2*  HCT 38.9 33.3*  MCV 96.3 95.7  PLT 151 147*   Cardiac Enzymes: No results for input(s): CKTOTAL, CKMB, CKMBINDEX, TROPONINI in the last 168 hours. BNP (last 3 results) No results for input(s): PROBNP in the last 8760 hours. CBG: No results for input(s): GLUCAP in the last 168 hours. D-Dimer: No results for input(s): DDIMER in the last 72 hours. Hgb A1c: No results for input(s): HGBA1C in the last 72 hours. Lipid Profile: No results for input(s): CHOL, HDL, LDLCALC, TRIG, CHOLHDL, LDLDIRECT in the last 72 hours. Thyroid function studies: No results for input(s): TSH, T4TOTAL, T3FREE, THYROIDAB in the last 72 hours.  Invalid input(s): FREET3 Anemia work up: No results for input(s): VITAMINB12, FOLATE, FERRITIN, TIBC, IRON, RETICCTPCT in the last 72 hours. Sepsis Labs:  Recent Labs Lab 02/03/17 1354 02/03/17 1355 02/04/17 0553  WBC 11.4*  --  7.3  LATICACIDVEN  --  1.02  --    Microbiology Recent Results (from the past 240 hour(s))  C difficile quick scan w PCR reflex     Status: Abnormal   Collection Time: 02/03/17  2:07 PM  Result Value Ref Range Status   C Diff antigen POSITIVE (A) NEGATIVE Final   C Diff toxin POSITIVE (A) NEGATIVE Final   C Diff interpretation Toxin producing C. difficile detected.  Final    Comment: CRITICAL RESULT CALLED TO, READ BACK BY AND VERIFIED WITH: M.ROSSER RN AT 1630 ON 02/03/17 BY S.VANHOORNE MLS       Medications:   . aspirin EC  81 mg Oral Daily  . carbidopa-levodopa  1 tablet Oral QID  . citalopram  40 mg Oral Daily  . donepezil  5 mg Oral QHS  . gabapentin  200 mg Oral QHS  . heparin  5,000 Units Subcutaneous Q8H  . latanoprost  1 drop Both Eyes QHS  . levothyroxine  44 mcg Oral Once per day on Mon Wed Fri  . [START ON 02/05/2017] levothyroxine  88 mcg Oral Once per day on Sun Tue Thu Sat  . mometasone-formoterol  2 puff Inhalation BID  . potassium chloride  30 mEq Oral Once  . vancomycin  125 mg Oral  QID   Continuous Infusions: . sodium chloride 50 mL/hr at 02/03/17 2111  . sodium chloride        LOS: 1 day   Marinda Elk  [facility] Pager [phone]  *Please refer to [email or web address], password [email or web address] to get updated schedule on who will round on this patient, as hospitalists switch teams weekly. If 7PM-7AM, please contact night-coverage at [email or web address], password [email or web address] for any overnight needs.  02/04/2017, 7:37 AM

## 2017-02-04 NOTE — Evaluation (Signed)
Physical Therapy Evaluation Patient Details Name: Brittany Crosby MRN: [id number] DOB: 1940/06/03 Today's Date: 02/04/2017   History of Present Illness  The patient is a 76 year old female past medical history significant for dementia, Parkinson's C, COPD, depression, and hypothyroidism who presents with , fever and nonbloody diarrhea. S/P EGD 8/17.  positive  for c diff  Clinical Impression  The  Patient's mobility is limited this visit due to discomfort of flexiseal when sitting. No family present for  DC planning. Pt admitted with above diagnosis. Pt currently with functional limitations due to the deficits listed below (see PT Problem List). Pt will benefit from skilled PT to increase their independence and safety with mobility to allow discharge to the venue listed below.       Follow Up Recommendations SNF;Supervision/Assistance - 24 hour  Vs. HHPT if able to  Ambulate.    Equipment Recommendations  None recommended by PT    Recommendations for Other Services       Precautions / Restrictions Precautions Precautions: Fall Precaution Comments: flexiseal, fall Restrictions Weight Bearing Restrictions: No      Mobility  Bed Mobility Overal bed mobility: Needs Assistance Bed Mobility: Supine to Sit;Sit to Supine     Supine to sit: Max assist Sit to supine: Max assist   General bed mobility comments: use of bed pad to turn and slide patient with rectal tube. Patient with complaints of increased pain related to rectal tube so unable to sit for any time.  Assisted back to supine using bed pads to slide and turn patient.  Transfers                 General transfer comment: unable  Ambulation/Gait                Stairs            Wheelchair Mobility    Modified Rankin (Stroke Patients Only)       Balance Overall balance assessment: History of Falls;Needs assistance Sitting-balance support: Feet unsupported;Bilateral upper extremity  supported Sitting balance-Leahy Scale: Poor Sitting balance - Comments: shifted from rectal tube                                     Pertinent Vitals/Pain Pain Assessment: Faces Faces Pain Scale: Hurts even more Pain Location: rectal tube Pain Descriptors / Indicators: Burning;Pressure Pain Intervention(s): Repositioned;Limited activity within patient's tolerance    Home Living Family/patient expects to be discharged to:: Private residence Living Arrangements: Spouse/significant other Available Help at Discharge: Family;Available 24 hours/day Type of Home: House Home Access: Stairs to enter Entrance Stairs-Rails: None Entrance Stairs-Number of Steps: 2 Home Layout: One level Home Equipment: Shower seat - built in;Walker - 2 wheels;Walker - 4 wheels;Cane - single point      Prior Function Level of Independence: Independent with assistive device(s)         Comments: mostly sponge bathes recently due to fear of falling, taken form previous encounter.     Hand Dominance        Extremity/Trunk Assessment   Upper Extremity Assessment Upper Extremity Assessment: Generalized weakness    Lower Extremity Assessment Lower Extremity Assessment: Generalized weakness    Cervical / Trunk Assessment Cervical / Trunk Assessment: Kyphotic  Communication   Communication: No difficulties  Cognition Arousal/Alertness: Awake/alert Behavior During Therapy: WFL for tasks assessed/performed Overall Cognitive Status: Within Functional Limits for tasks assessed  General Comments      Exercises     Assessment/Plan    PT Assessment Patient needs continued PT services  PT Problem List Decreased strength;Decreased activity tolerance;Decreased balance;Decreased mobility;Decreased knowledge of precautions;Decreased safety awareness;Decreased knowledge of use of DME       PT Treatment Interventions DME  instruction;Gait training;Functional mobility training;Therapeutic activities;Therapeutic exercise;Patient/family education    PT Goals (Current goals can be found in the Care Plan section)  Acute Rehab PT Goals Patient Stated Goal: agreed to sitting,  PT Goal Formulation: With patient Time For Goal Achievement: 02/18/17 Potential to Achieve Goals: Good    Frequency Min 3X/week   Barriers to discharge        Co-evaluation               AM-PAC PT "6 Clicks" Daily Activity  Outcome Measure Difficulty turning over in bed (including adjusting bedclothes, sheets and blankets)?: Unable Difficulty moving from lying on back to sitting on the side of the bed? : Unable Difficulty sitting down on and standing up from a chair with arms (e.g., wheelchair, bedside commode, etc,.)?: Unable Help needed moving to and from a bed to chair (including a wheelchair)?: Total Help needed walking in hospital room?: Total Help needed climbing 3-5 steps with a railing? : Total 6 Click Score: 6    End of Session   Activity Tolerance: Patient limited by pain Patient left: in bed;with call bell/phone within reach;with bed alarm set Nurse Communication: Mobility status PT Visit Diagnosis: Unsteadiness on feet (R26.81);Pain    Time: 4540-9811 PT Time Calculation (min) (ACUTE ONLY): 29 min   Charges:     PT Treatments $Therapeutic Activity: 8-22 mins   PT G Codes:       Rada Hay 02/04/2017, 10:10 AM Blanchard Kelch PT [phone]

## 2017-02-05 DIAGNOSIS — E860 Dehydration: Secondary | ICD-10-CM

## 2017-02-05 MED ORDER — VANCOMYCIN HCL 250 MG PO CAPS: 250.0000 mg | ORAL_CAPSULE | Freq: Four times a day (QID) | ORAL | 0 refills | Status: DC

## 2017-02-05 MED ORDER — VANCOMYCIN HCL 250 MG PO CAPS: 250.0000 mg | ORAL_CAPSULE | Freq: Four times a day (QID) | ORAL | 0 refills | Status: AC

## 2017-02-05 MED ORDER — VANCOMYCIN 50 MG/ML ORAL SOLUTION: 125.0000 mg | Freq: Four times a day (QID) | ORAL | 0 refills | Status: DC

## 2017-02-05 NOTE — Discharge Summary (Signed)
Physician Discharge Summary  Brittany Crosby WUJ:[id number] DOB: [birth year] DOA: 02/03/2017  PCP: Sigmund Hazel, MD  Admit date: 02/03/2017 Discharge date: 02/05/2017  Admitted From: home Disposition:  Home  Recommendations for Outpatient Follow-up:  1. Follow up with PCP in 1-2 weeks 2. Please obtain BMP/CBC in one week 3. She will go home with home health and physical therapy.  Home Health:Yes Equipment/Devices:none  Discharge Condition:STable CODE STATUS:DNR Diet recommendation: Heart Healthy   Brief/Interim Summary: 76 y.o. female past medical history of dementia oxygen dependent COPD recently discharged from the hospital on Levaquin comes in with C. difficile colitis.  Discharge Diagnoses:  Active Problems:   COPD, severe (HCC)   Dementia due to Parkinson's disease with behavioral disturbance (HCC)   Essential hypertension   Hypothyroidism   C. difficile colitis   Acute hepatic encephalopathy  C. difficile colitis: Likely due to antibiotics, she was admitted to the hospital flexes he was placed she was started on oral vancomycin. Once her stools were more formed she was discharged home. Physical therapy evaluated her the recommended home with physical therapy.  Acute encephalopathy: In the setting of dementia/Parkinson's. She improved to baseline according to her significant other.  Severe COPD: Stable.  Dementia due to Parkinson's disease with behavioral disturbances:   she remained stable in the hospital no changes were made.  Discharge Instructions  Discharge Instructions    Diet - low sodium heart healthy    Complete by:  As directed    Increase activity slowly    Complete by:  As directed      Allergies as of 02/05/2017      Reactions   Codeine Other (See Comments)   Abdominal pain   Sulfa Antibiotics Swelling   Penicillins Rash      Medication List    STOP taking these medications   levofloxacin 750 MG tablet Commonly known as:  LEVAQUIN    metroNIDAZOLE 500 MG tablet Commonly known as:  FLAGYL     TAKE these medications   AEROCHAMBER MV inhaler Use as instructed   aspirin EC 81 MG tablet Take 81 mg by mouth daily.   budesonide-formoterol 160-4.5 MCG/ACT inhaler Commonly known as:  SYMBICORT Inhale 2 puffs into the lungs 2 (two) times daily.   carbidopa-levodopa 25-100 MG tablet Commonly known as:  SINEMET IR Take 1 tablet by mouth 4 (four) times daily. At 8 AM, 12, 4 PM and 8 PM   citalopram 40 MG tablet Commonly known as:  CELEXA Take 40 mg by mouth daily.   donepezil 5 MG tablet Commonly known as:  ARICEPT Take 5 mg by mouth at bedtime.   gabapentin 100 MG capsule Commonly known as:  NEURONTIN Take 2 capsules (200 mg total) by mouth at bedtime.   latanoprost 0.005 % ophthalmic solution Commonly known as:  XALATAN Place 1 drop into both eyes at bedtime.   levothyroxine 88 MCG tablet Commonly known as:  SYNTHROID, LEVOTHROID Take 44-88 mcg by mouth daily. Take on Monday, Wednesday and Friday.  Take 88 mcg all other days of the week   omeprazole 20 MG capsule Commonly known as:  PRILOSEC Take 20 mg by mouth daily.   ONE-A-DAY WOMENS 50+ ADVANTAGE PO Take 1 tablet by mouth daily.   OXYGEN Inhale 2 L into the lungs at bedtime.   potassium chloride SA 20 MEQ tablet Commonly known as:  K-DUR,KLOR-CON Take 1 tablet (20 mEq total) by mouth daily. Take for 7 days   PROAIR HFA 108 (90 Base)  MCG/ACT inhaler Generic drug:  albuterol Inhale 2 puffs into the lungs every 4 (four) hours as needed for wheezing or shortness of breath.   vancomycin 50 mg/mL oral solution Commonly known as:  VANCOCIN Take 2.5 mLs (125 mg total) by mouth 4 (four) times daily.            Discharge Care Instructions        Start     Ordered   02/05/17 0000  vancomycin (VANCOCIN) 50 mg/mL oral solution  4 times daily     02/05/17 0818   02/05/17 0000  Increase activity slowly     02/05/17 0818   02/05/17 0000   Diet - low sodium heart healthy     02/05/17 0818      Allergies  Allergen Reactions  . Codeine Other (See Comments)    Abdominal pain  . Sulfa Antibiotics Swelling  . Penicillins Rash    Consultations:  None   Procedures/Studies: Dg Chest 2 View  Result Date: 02/03/2017 CLINICAL DATA:  Sepsis. EXAM: CHEST  2 VIEW COMPARISON:  January 31, 2017. FINDINGS: The cardiomediastinal silhouette is normal in size. Normal pulmonary vascularity. Unchanged lung hyperinflation with emphysematous changes in the bilateral upper lobes. No focal consolidation, pleural effusion, or pneumothorax. No acute osseous abnormality. IMPRESSION: COPD.  No active cardiopulmonary disease. Electronically Signed   By: Obie Dredge M.D.   On: 02/03/2017 16:43   Ct Chest Lung Cancer Screening Low Dose Wo Contrast  Result Date: 01/17/2017 CLINICAL DATA:  76 year old female former smoker (quit in 2013) with 30 pack-year history of smoking. Lung cancer screening examination. EXAM: CT CHEST WITHOUT CONTRAST LOW-DOSE FOR LUNG CANCER SCREENING TECHNIQUE: Multidetector CT imaging of the chest was performed following the standard protocol without IV contrast. COMPARISON:  Low-dose lung cancer screening chest CT 01/15/2016. FINDINGS: Cardiovascular: Heart size is normal. There is no significant pericardial fluid, thickening or pericardial calcification. Aortic atherosclerosis. No coronary artery calcifications. Mediastinum/Nodes: No pathologically enlarged mediastinal or hilar lymph nodes. Please note that accurate exclusion of hilar adenopathy is limited on noncontrast CT scans. Esophagus is unremarkable in appearance. No axillary lymphadenopathy. Lungs/Pleura: Multiple tiny pulmonary nodules scattered throughout the lungs bilaterally, similar to prior examinations, with the largest of these in the periphery of the right upper lobe (axial image 55 of series 3) with a volume derived mean diameter of 3.9 mm. No larger more  suspicious appearing pulmonary nodules or masses are noted. No acute consolidative airspace disease. No pleural effusions. Diffuse bronchial wall thickening with mild centrilobular and paraseptal emphysema. Extensive bilateral apical nodular pleuroparenchymal thickening, most compatible with chronic scarring, similar to prior studies. Upper Abdomen: Aortic atherosclerosis.  Status post cholecystectomy. Musculoskeletal: There are no aggressive appearing lytic or blastic lesions noted in the visualized portions of the skeleton. IMPRESSION: 1. Lung-RADS 2, benign appearance or behavior. Continue annual screening with low-dose chest CT without contrast in 12 months. 2. Mild diffuse bronchial wall thickening with mild centrilobular and paraseptal emphysema; imaging findings suggestive of underlying COPD. 3. Aortic atherosclerosis. Aortic Atherosclerosis (ICD10-I70.0) and Emphysema (ICD10-J43.9). Electronically Signed   By: Trudie Reed M.D.   On: 01/17/2017 16:26     Subjective: No new complains.  Discharge Exam: Vitals:   02/05/17 0531 02/05/17 0818  BP: (!) 146/84   Pulse: 88   Resp: 16   Temp: 98.2 F (36.8 C)   SpO2: 94% 97%   Vitals:   02/04/17 2053 02/04/17 2100 02/05/17 0531 02/05/17 0818  BP: 109/83  (!) 146/84  Pulse: 85  88   Resp: 16  16   Temp: 98.1 F (36.7 C)  98.2 F (36.8 C)   TempSrc: Oral  Oral   SpO2: 94% 93% 94% 97%  Weight:        General: Pt is alert, awake, not in acute distress Cardiovascular: RRR, S1/S2 +, no rubs, no gallops Respiratory: CTA bilaterally, no wheezing, no rhonchi Abdominal: Soft, NT, ND, bowel sounds + Extremities: no edema, no cyanosis    The results of significant diagnostics from this hospitalization (including imaging, microbiology, ancillary and laboratory) are listed below for reference.     Microbiology: Recent Results (from the past 240 hour(s))  Gastrointestinal Panel by PCR , Stool     Status: None   Collection Time:  02/03/17  2:07 PM  Result Value Ref Range Status   Campylobacter species NOT DETECTED NOT DETECTED Final   Plesimonas shigelloides NOT DETECTED NOT DETECTED Final   Salmonella species NOT DETECTED NOT DETECTED Final   Yersinia enterocolitica NOT DETECTED NOT DETECTED Final   Vibrio species NOT DETECTED NOT DETECTED Final   Vibrio cholerae NOT DETECTED NOT DETECTED Final   Enteroaggregative E coli (EAEC) NOT DETECTED NOT DETECTED Final   Enteropathogenic E coli (EPEC) NOT DETECTED NOT DETECTED Final   Enterotoxigenic E coli (ETEC) NOT DETECTED NOT DETECTED Final   Shiga like toxin producing E coli (STEC) NOT DETECTED NOT DETECTED Final   Shigella/Enteroinvasive E coli (EIEC) NOT DETECTED NOT DETECTED Final   Cryptosporidium NOT DETECTED NOT DETECTED Final   Cyclospora cayetanensis NOT DETECTED NOT DETECTED Final   Entamoeba histolytica NOT DETECTED NOT DETECTED Final   Giardia lamblia NOT DETECTED NOT DETECTED Final   Adenovirus F40/41 NOT DETECTED NOT DETECTED Final   Astrovirus NOT DETECTED NOT DETECTED Final   Norovirus GI/GII NOT DETECTED NOT DETECTED Final   Rotavirus A NOT DETECTED NOT DETECTED Final   Sapovirus (I, II, IV, and V) NOT DETECTED NOT DETECTED Final  C difficile quick scan w PCR reflex     Status: Abnormal   Collection Time: 02/03/17  2:07 PM  Result Value Ref Range Status   C Diff antigen POSITIVE (A) NEGATIVE Final   C Diff toxin POSITIVE (A) NEGATIVE Final   C Diff interpretation Toxin producing C. difficile detected.  Final    Comment: CRITICAL RESULT CALLED TO, READ BACK BY AND VERIFIED WITH: M.ROSSER RN AT 1630 ON 02/03/17 BY S.VANHOORNE MLS   Blood Culture (routine x 2)     Status: None (Preliminary result)   Collection Time: 02/03/17  2:11 PM  Result Value Ref Range Status   Specimen Description BLOOD RIGHT ANTECUBITAL  Final   Special Requests   Final    BOTTLES DRAWN AEROBIC AND ANAEROBIC Blood Culture adequate volume   Culture   Final    NO GROWTH <  24 HOURS Performed at [facility], [address] [place], [place] [address]    Report Status PENDING  Incomplete  Blood Culture (routine x 2)     Status: None (Preliminary result)   Collection Time: 02/03/17  3:09 PM  Result Value Ref Range Status   Specimen Description BLOOD LEFT HAND  Final   Special Requests IN PEDIATRIC BOTTLE Blood Culture adequate volume  Final   Culture   Final    NO GROWTH < 24 HOURS Performed at [facility], [address] [place], [place] [address]    Report Status PENDING  Incomplete  Labs: BNP (last 3 results) No results for input(s): BNP in the last 8760 hours. Basic Metabolic Panel:  Recent Labs Lab 02/03/17 1354 02/04/17 0553  NA 138 139  K 4.0 3.1*  CL 105 109  CO2 23 23  GLUCOSE 112* 91  BUN 27* 20  CREATININE 0.50 0.47  CALCIUM 9.1 8.1*   Liver Function Tests:  Recent Labs Lab 02/03/17 1354  AST 25  ALT 12*  ALKPHOS 63  BILITOT 1.0  PROT 6.6  ALBUMIN 3.8   No results for input(s): LIPASE, AMYLASE in the last 168 hours. No results for input(s): AMMONIA in the last 168 hours. CBC:  Recent Labs Lab 02/03/17 1354 02/04/17 0553  WBC 11.4* 7.3  NEUTROABS 10.5*  --   HGB 12.7 11.2*  HCT 38.9 33.3*  MCV 96.3 95.7  PLT 151 147*   Cardiac Enzymes: No results for input(s): CKTOTAL, CKMB, CKMBINDEX, TROPONINI in the last 168 hours. BNP: Invalid input(s): POCBNP CBG: No results for input(s): GLUCAP in the last 168 hours. D-Dimer No results for input(s): DDIMER in the last 72 hours. Hgb A1c No results for input(s): HGBA1C in the last 72 hours. Lipid Profile No results for input(s): CHOL, HDL, LDLCALC, TRIG, CHOLHDL, LDLDIRECT in the last 72 hours. Thyroid function studies No results for input(s): TSH, T4TOTAL, T3FREE, THYROIDAB in the last 72 hours.  Invalid input(s): FREET3 Anemia work up No results for input(s): VITAMINB12, FOLATE, FERRITIN, TIBC, IRON, RETICCTPCT in the last 72  hours. Urinalysis    Component Value Date/Time   COLORURINE AMBER (A) 02/03/2017 1626   APPEARANCEUR HAZY (A) 02/03/2017 1626   LABSPEC 1.020 02/03/2017 1626   PHURINE 5.0 02/03/2017 1626   GLUCOSEU NEGATIVE 02/03/2017 1626   HGBUR NEGATIVE 02/03/2017 1626   BILIRUBINUR NEGATIVE 02/03/2017 1626   KETONESUR 80 (A) 02/03/2017 1626   PROTEINUR NEGATIVE 02/03/2017 1626   NITRITE NEGATIVE 02/03/2017 1626   LEUKOCYTESUR NEGATIVE 02/03/2017 1626   Sepsis Labs Invalid input(s): PROCALCITONIN,  WBC,  LACTICIDVEN Microbiology Recent Results (from the past 240 hour(s))  Gastrointestinal Panel by PCR , Stool     Status: None   Collection Time: 02/03/17  2:07 PM  Result Value Ref Range Status   Campylobacter species NOT DETECTED NOT DETECTED Final   Plesimonas shigelloides NOT DETECTED NOT DETECTED Final   Salmonella species NOT DETECTED NOT DETECTED Final   Yersinia enterocolitica NOT DETECTED NOT DETECTED Final   Vibrio species NOT DETECTED NOT DETECTED Final   Vibrio cholerae NOT DETECTED NOT DETECTED Final   Enteroaggregative E coli (EAEC) NOT DETECTED NOT DETECTED Final   Enteropathogenic E coli (EPEC) NOT DETECTED NOT DETECTED Final   Enterotoxigenic E coli (ETEC) NOT DETECTED NOT DETECTED Final   Shiga like toxin producing E coli (STEC) NOT DETECTED NOT DETECTED Final   Shigella/Enteroinvasive E coli (EIEC) NOT DETECTED NOT DETECTED Final   Cryptosporidium NOT DETECTED NOT DETECTED Final   Cyclospora cayetanensis NOT DETECTED NOT DETECTED Final   Entamoeba histolytica NOT DETECTED NOT DETECTED Final   Giardia lamblia NOT DETECTED NOT DETECTED Final   Adenovirus F40/41 NOT DETECTED NOT DETECTED Final   Astrovirus NOT DETECTED NOT DETECTED Final   Norovirus GI/GII NOT DETECTED NOT DETECTED Final   Rotavirus A NOT DETECTED NOT DETECTED Final   Sapovirus (I, II, IV, and V) NOT DETECTED NOT DETECTED Final  C difficile quick scan w PCR reflex     Status: Abnormal   Collection Time:  02/03/17  2:07 PM  Result Value Ref  Range Status   C Diff antigen POSITIVE (A) NEGATIVE Final   C Diff toxin POSITIVE (A) NEGATIVE Final   C Diff interpretation Toxin producing C. difficile detected.  Final    Comment: CRITICAL RESULT CALLED TO, READ BACK BY AND VERIFIED WITH: M.ROSSER RN AT 1630 ON 02/03/17 BY S.VANHOORNE MLS   Blood Culture (routine x 2)     Status: None (Preliminary result)   Collection Time: 02/03/17  2:11 PM  Result Value Ref Range Status   Specimen Description BLOOD RIGHT ANTECUBITAL  Final   Special Requests   Final    BOTTLES DRAWN AEROBIC AND ANAEROBIC Blood Culture adequate volume   Culture   Final    NO GROWTH < 24 HOURS Performed at [facility], [address], [place], [place] [address]    Report Status PENDING  Incomplete  Blood Culture (routine x 2)     Status: None (Preliminary result)   Collection Time: 02/03/17  3:09 PM  Result Value Ref Range Status   Specimen Description BLOOD LEFT HAND  Final   Special Requests IN PEDIATRIC BOTTLE Blood Culture adequate volume  Final   Culture   Final    NO GROWTH < 24 HOURS Performed at [facility], [address] [place], [place] [address]    Report Status PENDING  Incomplete     Time coordinating discharge: Over 30 minutes  SIGNED:   Marinda Elk, MD  [facility] 02/05/2017, 8:19 AM Pager   If 7PM-7AM, please contact night-coverage [email or web address] Password [email or web address]

## 2017-02-05 NOTE — Care Management Note (Addendum)
Case Management Note  Patient Details  Name: Silvia Hightower MRN: [id number] Date of Birth: [birth year]  Subjective/Objective:    COPD, c diff colitis, Acute encephalopathy                Action/Plan: Discharge Planning: NCM spoke to pt's so, Theodoro Grist. States she lives in the home with him. She has oxygen with [facility] and [facility]W at home. Pt was active with [facility] for [facility]. SO, Dave agreeable to [facility] for [facility]. States he does not know if he can manage her at home. Attempted to call daughter's Selena Batten and Pam to assist with dc plan.   PCP Sigmund Hazel MD   Expected Discharge Date:  02/05/17               Expected Discharge Plan:  Home w Home Health Services  In-House Referral:  NA  Discharge planning Services  CM Consult  Post Acute Care Choice:  Home Health, Resumption of Svcs/PTA Provider Choice offered to:  Spouse  DME Arranged:  N/A DME Agency:  NA  HH Arranged:  PT, RN, Nurse's Aide, SW, OT HH Agency:  [facility]  Status of Service:  Completed, signed off  If discussed at Long Length of Stay Meetings, dates discussed:    Additional Comments:  Elliot Cousin, RN 02/05/2017, 10:10 AM

## 2017-02-05 NOTE — Progress Notes (Signed)
The patient is discharged to home. DC instructions given to female family member in  Room. Prescription for vancomycin given. No concerns voiced. Female family member in to transport pt to home. Pt left unit in wheelchair pushed by nurse tech accompanied by female family member. Left in stable condition. Pt's cell phone retrieved from room and placed at the nursing station for female family member to pick up later. Female family member called to inquire about cell phone and said he would be back later to pick phone up.  Derinda Sis.

## 2017-02-05 NOTE — Progress Notes (Signed)
[facility] PROGRESS NOTE    Progress Note  Brittany Crosby  ZOX:[id number] DOB: 03-20-1941 DOA: 02/03/2017 PCP: Sigmund Hazel, MD     Brief Narrative:   Brittany Crosby is an 76 y.o. female past medical history of dementia oxygen dependent COPD recently discharged from the hospital on Levaquin comes in with C. difficile colitis.  Assessment/Plan:   C. difficile colitis: Likely due to antibiotics. Continue oral vancomycin her diarrhea has slowed down and are now more formed. Physical therapy evaluated the patient they recommended skilled nursing facility. Awaiting placement.  Acute encephalopathy: Slowly improving.  Severe COPD: Continue oxygen seems to be at baseline.  Dementia due to Parkinson's disease with behavioral disturbances: continue current medications.  Essential hypertension: Blood pressure seems to be stable off her antihypertensive medication.    DVT prophylaxis: lovenox Family Communication:none Disposition Plan/Barrier to D/C: Awaiting placement. Code Status:     Code Status Orders        Start     Ordered   02/03/17 2101  Do not attempt resuscitation (DNR)  Continuous    Question Answer Comment  In the event of cardiac or respiratory ARREST Do not call a "code blue"   In the event of cardiac or respiratory ARREST Do not perform Intubation, CPR, defibrillation or ACLS   In the event of cardiac or respiratory ARREST Use medication by any route, position, wound care, and other measures to relive pain and suffering. May use oxygen, suction and manual treatment of airway obstruction as needed for comfort.      02/03/17 2100    Code Status History    Date Active Date Inactive Code Status Order ID Comments User Context   01/06/2017  3:01 AM 01/09/2017  5:56 PM DNR [id number]  Alberteen Sam, MD Inpatient    Advance Directive Documentation     Most Recent Value  Type of Advance Directive  Healthcare Power of Attorney  Pre-existing  out of facility DNR order (yellow form or pink MOST form)  -  "MOST" Form in Place?  -        IV Access:    Peripheral IV   Procedures and diagnostic studies:   Dg Chest 2 View  Result Date: 02/03/2017 CLINICAL DATA:  Sepsis. EXAM: CHEST  2 VIEW COMPARISON:  January 31, 2017. FINDINGS: The cardiomediastinal silhouette is normal in size. Normal pulmonary vascularity. Unchanged lung hyperinflation with emphysematous changes in the bilateral upper lobes. No focal consolidation, pleural effusion, or pneumothorax. No acute osseous abnormality. IMPRESSION: COPD.  No active cardiopulmonary disease. Electronically Signed   By: Obie Dredge M.D.   On: 02/03/2017 16:43     Medical Consultants:    None.  Anti-Infectives:   Oral vanc  Subjective:    Brittany Crosby she relates she feels well no new complaints.  Objective:    Vitals:   02/04/17 1300 02/04/17 2053 02/04/17 2100 02/05/17 0531  BP: (!) 111/47 109/83  (!) 146/84  Pulse: 98 85  88  Resp: Temp: 98.1 F (36.7 C) 98.1 F (36.7 C)  98.2 F (36.8 C)  TempSrc: Oral Oral  Oral  SpO2: 96% 94% 93% 94%  Weight:        Intake/Output Summary (Last 24 hours) at 02/05/17 0802 Last data filed at 02/04/17 1800  Gross per 24 hour  Intake           326.67 ml  Output  300 ml  Net            26.67 ml   Filed Weights   02/03/17 2108  Weight: 55.7 kg (122 lb 12.7 oz)    Exam: General exam: In no acute distress. Respiratory system: Good air movement and clear to auscultation. Cardiovascular system:Regular in rhythm with positive S1-S2. Gastrointestinal system: Positive bowel sounds soft nontender nondistended.  Central nervous system: Alert awake and oriented 3. Extremities: No pedal edema. Skin: No rashes, lesions or ulcers   Data Reviewed:    Labs: Basic Metabolic Panel:  Recent Labs Lab 02/03/17 1354 02/04/17 0553  NA 138 139  K 4.0 3.1*  CL 105 109  CO2 23 23  GLUCOSE  112* 91  BUN 27* 20  CREATININE 0.50 0.47  CALCIUM 9.1 8.1*   GFR Estimated Creatinine Clearance: 51 mL/min (by C-G formula based on SCr of 0.47 mg/dL). Liver Function Tests:  Recent Labs Lab 02/03/17 1354  AST 25  ALT 12*  ALKPHOS 63  BILITOT 1.0  PROT 6.6  ALBUMIN 3.8   No results for input(s): LIPASE, AMYLASE in the last 168 hours. No results for input(s): AMMONIA in the last 168 hours. Coagulation profile No results for input(s): INR, PROTIME in the last 168 hours.  CBC:  Recent Labs Lab 02/03/17 1354 02/04/17 0553  WBC 11.4* 7.3  NEUTROABS 10.5*  --   HGB 12.7 11.2*  HCT 38.9 33.3*  MCV 96.3 95.7  PLT 151 147*   Cardiac Enzymes: No results for input(s): CKTOTAL, CKMB, CKMBINDEX, TROPONINI in the last 168 hours. BNP (last 3 results) No results for input(s): PROBNP in the last 8760 hours. CBG: No results for input(s): GLUCAP in the last 168 hours. D-Dimer: No results for input(s): DDIMER in the last 72 hours. Hgb A1c: No results for input(s): HGBA1C in the last 72 hours. Lipid Profile: No results for input(s): CHOL, HDL, LDLCALC, TRIG, CHOLHDL, LDLDIRECT in the last 72 hours. Thyroid function studies: No results for input(s): TSH, T4TOTAL, T3FREE, THYROIDAB in the last 72 hours.  Invalid input(s): FREET3 Anemia work up: No results for input(s): VITAMINB12, FOLATE, FERRITIN, TIBC, IRON, RETICCTPCT in the last 72 hours. Sepsis Labs:  Recent Labs Lab 02/03/17 1354 02/03/17 1355 02/04/17 0553  WBC 11.4*  --  7.3  LATICACIDVEN  --  1.02  --    Microbiology Recent Results (from the past 240 hour(s))  Gastrointestinal Panel by PCR , Stool     Status: None   Collection Time: 02/03/17  2:07 PM  Result Value Ref Range Status   Campylobacter species NOT DETECTED NOT DETECTED Final   Plesimonas shigelloides NOT DETECTED NOT DETECTED Final   Salmonella species NOT DETECTED NOT DETECTED Final   Yersinia enterocolitica NOT DETECTED NOT DETECTED Final    Vibrio species NOT DETECTED NOT DETECTED Final   Vibrio cholerae NOT DETECTED NOT DETECTED Final   Enteroaggregative E coli (EAEC) NOT DETECTED NOT DETECTED Final   Enteropathogenic E coli (EPEC) NOT DETECTED NOT DETECTED Final   Enterotoxigenic E coli (ETEC) NOT DETECTED NOT DETECTED Final   Shiga like toxin producing E coli (STEC) NOT DETECTED NOT DETECTED Final   Shigella/Enteroinvasive E coli (EIEC) NOT DETECTED NOT DETECTED Final   Cryptosporidium NOT DETECTED NOT DETECTED Final   Cyclospora cayetanensis NOT DETECTED NOT DETECTED Final   Entamoeba histolytica NOT DETECTED NOT DETECTED Final   Giardia lamblia NOT DETECTED NOT DETECTED Final   Adenovirus F40/41 NOT DETECTED NOT DETECTED Final   Astrovirus NOT  DETECTED NOT DETECTED Final   Norovirus GI/GII NOT DETECTED NOT DETECTED Final   Rotavirus A NOT DETECTED NOT DETECTED Final   Sapovirus (I, II, IV, and V) NOT DETECTED NOT DETECTED Final  C difficile quick scan w PCR reflex     Status: Abnormal   Collection Time: 02/03/17  2:07 PM  Result Value Ref Range Status   C Diff antigen POSITIVE (A) NEGATIVE Final   C Diff toxin POSITIVE (A) NEGATIVE Final   C Diff interpretation Toxin producing C. difficile detected.  Final    Comment: CRITICAL RESULT CALLED TO, READ BACK BY AND VERIFIED WITH: M.ROSSER RN AT 1630 ON 02/03/17 BY S.VANHOORNE MLS   Blood Culture (routine x 2)     Status: None (Preliminary result)   Collection Time: 02/03/17  2:11 PM  Result Value Ref Range Status   Specimen Description BLOOD RIGHT ANTECUBITAL  Final   Special Requests   Final    BOTTLES DRAWN AEROBIC AND ANAEROBIC Blood Culture adequate volume   Culture   Final    NO GROWTH < 24 HOURS Performed at [facility], [address], [place], [place] [address]    Report Status PENDING  Incomplete  Blood Culture (routine x 2)     Status: None (Preliminary result)   Collection Time: 02/03/17  3:09 PM  Result Value Ref Range Status   Specimen  Description BLOOD LEFT HAND  Final   Special Requests IN PEDIATRIC BOTTLE Blood Culture adequate volume  Final   Culture   Final    NO GROWTH < 24 HOURS Performed at [facility], [address], [place], [place] [address]    Report Status PENDING  Incomplete     Medications:   . aspirin EC  81 mg Oral Daily  . carbidopa-levodopa  1 tablet Oral QID  . citalopram  40 mg Oral Daily  . donepezil  5 mg Oral QHS  . gabapentin  200 mg Oral QHS  . heparin  5,000 Units Subcutaneous Q8H  . latanoprost  1 drop Both Eyes QHS  . levothyroxine  44 mcg Oral Once per day on Mon Wed Fri  . levothyroxine  88 mcg Oral Once per day on Sun Tue Thu Sat  . mometasone-formoterol  2 puff Inhalation BID  . vancomycin  125 mg Oral QID   Continuous Infusions: . sodium chloride        LOS: 2 days   Marinda Elk  [facility] Pager [phone]  *Please refer to [email or web address], password [email or web address] to get updated schedule on who will round on this patient, as hospitalists switch teams weekly. If 7PM-7AM, please contact night-coverage at [email or web address], password [email or web address] for any overnight needs.  02/05/2017, 8:02 AM

## 2017-02-06 DIAGNOSIS — J449 Chronic obstructive pulmonary disease, unspecified: Secondary | ICD-10-CM | POA: Diagnosis not present

## 2017-02-06 DIAGNOSIS — H409 Unspecified glaucoma: Secondary | ICD-10-CM | POA: Diagnosis not present

## 2017-02-06 DIAGNOSIS — K529 Noninfective gastroenteritis and colitis, unspecified: Secondary | ICD-10-CM | POA: Diagnosis not present

## 2017-02-06 DIAGNOSIS — F329 Major depressive disorder, single episode, unspecified: Secondary | ICD-10-CM | POA: Diagnosis not present

## 2017-02-06 DIAGNOSIS — G20 Parkinson's disease: Secondary | ICD-10-CM | POA: Diagnosis not present

## 2017-02-06 DIAGNOSIS — F0281 Dementia in other diseases classified elsewhere with behavioral disturbance: Secondary | ICD-10-CM | POA: Diagnosis not present

## 2017-02-07 DIAGNOSIS — H409 Unspecified glaucoma: Secondary | ICD-10-CM | POA: Diagnosis not present

## 2017-02-07 DIAGNOSIS — J449 Chronic obstructive pulmonary disease, unspecified: Secondary | ICD-10-CM | POA: Diagnosis not present

## 2017-02-07 DIAGNOSIS — F329 Major depressive disorder, single episode, unspecified: Secondary | ICD-10-CM | POA: Diagnosis not present

## 2017-02-07 DIAGNOSIS — K529 Noninfective gastroenteritis and colitis, unspecified: Secondary | ICD-10-CM | POA: Diagnosis not present

## 2017-02-07 DIAGNOSIS — G20 Parkinson's disease: Secondary | ICD-10-CM | POA: Diagnosis not present

## 2017-02-07 DIAGNOSIS — F0281 Dementia in other diseases classified elsewhere with behavioral disturbance: Secondary | ICD-10-CM | POA: Diagnosis not present

## 2017-02-08 DIAGNOSIS — F329 Major depressive disorder, single episode, unspecified: Secondary | ICD-10-CM | POA: Diagnosis not present

## 2017-02-08 DIAGNOSIS — H409 Unspecified glaucoma: Secondary | ICD-10-CM | POA: Diagnosis not present

## 2017-02-08 DIAGNOSIS — K529 Noninfective gastroenteritis and colitis, unspecified: Secondary | ICD-10-CM | POA: Diagnosis not present

## 2017-02-08 DIAGNOSIS — F0281 Dementia in other diseases classified elsewhere with behavioral disturbance: Secondary | ICD-10-CM | POA: Diagnosis not present

## 2017-02-08 DIAGNOSIS — G20 Parkinson's disease: Secondary | ICD-10-CM | POA: Diagnosis not present

## 2017-02-08 DIAGNOSIS — J449 Chronic obstructive pulmonary disease, unspecified: Secondary | ICD-10-CM | POA: Diagnosis not present

## 2017-02-08 LAB — CULTURE, BLOOD (ROUTINE X 2)
CULTURE: NO GROWTH
CULTURE: NO GROWTH
Special Requests: ADEQUATE
Special Requests: ADEQUATE

## 2017-02-09 DIAGNOSIS — K529 Noninfective gastroenteritis and colitis, unspecified: Secondary | ICD-10-CM | POA: Diagnosis not present

## 2017-02-09 DIAGNOSIS — F329 Major depressive disorder, single episode, unspecified: Secondary | ICD-10-CM | POA: Diagnosis not present

## 2017-02-09 DIAGNOSIS — H409 Unspecified glaucoma: Secondary | ICD-10-CM | POA: Diagnosis not present

## 2017-02-09 DIAGNOSIS — J449 Chronic obstructive pulmonary disease, unspecified: Secondary | ICD-10-CM | POA: Diagnosis not present

## 2017-02-09 DIAGNOSIS — G20 Parkinson's disease: Secondary | ICD-10-CM | POA: Diagnosis not present

## 2017-02-09 DIAGNOSIS — F0281 Dementia in other diseases classified elsewhere with behavioral disturbance: Secondary | ICD-10-CM | POA: Diagnosis not present

## 2017-02-10 ENCOUNTER — Other Ambulatory Visit: Payer: Self-pay | Admitting: Family Medicine

## 2017-02-10 ENCOUNTER — Ambulatory Visit
Admission: RE | Admit: 2017-02-10 | Discharge: 2017-02-10 | Disposition: A | Discharge status: Home / Self Care | Payer: Medicare Other | Source: Ambulatory Visit | Attending: Family Medicine | Admitting: Family Medicine

## 2017-02-10 DIAGNOSIS — F329 Major depressive disorder, single episode, unspecified: Secondary | ICD-10-CM | POA: Diagnosis not present

## 2017-02-10 DIAGNOSIS — K529 Noninfective gastroenteritis and colitis, unspecified: Secondary | ICD-10-CM | POA: Diagnosis not present

## 2017-02-10 DIAGNOSIS — F0280 Dementia in other diseases classified elsewhere without behavioral disturbance: Secondary | ICD-10-CM | POA: Diagnosis not present

## 2017-02-10 DIAGNOSIS — H409 Unspecified glaucoma: Secondary | ICD-10-CM | POA: Diagnosis not present

## 2017-02-10 DIAGNOSIS — M79605 Pain in left leg: Secondary | ICD-10-CM

## 2017-02-10 DIAGNOSIS — M7989 Other specified soft tissue disorders: Secondary | ICD-10-CM | POA: Diagnosis not present

## 2017-02-10 DIAGNOSIS — G9340 Encephalopathy, unspecified: Secondary | ICD-10-CM | POA: Diagnosis not present

## 2017-02-10 DIAGNOSIS — G20 Parkinson's disease: Secondary | ICD-10-CM | POA: Diagnosis not present

## 2017-02-10 DIAGNOSIS — J449 Chronic obstructive pulmonary disease, unspecified: Secondary | ICD-10-CM | POA: Diagnosis not present

## 2017-02-10 DIAGNOSIS — F0281 Dementia in other diseases classified elsewhere with behavioral disturbance: Secondary | ICD-10-CM | POA: Diagnosis not present

## 2017-02-10 DIAGNOSIS — A0472 Enterocolitis due to Clostridium difficile, not specified as recurrent: Secondary | ICD-10-CM | POA: Diagnosis not present

## 2017-02-10 DIAGNOSIS — I82442 Acute embolism and thrombosis of left tibial vein: Secondary | ICD-10-CM | POA: Diagnosis not present

## 2017-02-11 DIAGNOSIS — G20 Parkinson's disease: Secondary | ICD-10-CM | POA: Diagnosis not present

## 2017-02-11 DIAGNOSIS — H409 Unspecified glaucoma: Secondary | ICD-10-CM | POA: Diagnosis not present

## 2017-02-11 DIAGNOSIS — F329 Major depressive disorder, single episode, unspecified: Secondary | ICD-10-CM | POA: Diagnosis not present

## 2017-02-11 DIAGNOSIS — F0281 Dementia in other diseases classified elsewhere with behavioral disturbance: Secondary | ICD-10-CM | POA: Diagnosis not present

## 2017-02-11 DIAGNOSIS — K529 Noninfective gastroenteritis and colitis, unspecified: Secondary | ICD-10-CM | POA: Diagnosis not present

## 2017-02-11 DIAGNOSIS — J449 Chronic obstructive pulmonary disease, unspecified: Secondary | ICD-10-CM | POA: Diagnosis not present

## 2017-02-15 DIAGNOSIS — F0281 Dementia in other diseases classified elsewhere with behavioral disturbance: Secondary | ICD-10-CM | POA: Diagnosis not present

## 2017-02-15 DIAGNOSIS — G20 Parkinson's disease: Secondary | ICD-10-CM | POA: Diagnosis not present

## 2017-02-15 DIAGNOSIS — K529 Noninfective gastroenteritis and colitis, unspecified: Secondary | ICD-10-CM | POA: Diagnosis not present

## 2017-02-15 DIAGNOSIS — H409 Unspecified glaucoma: Secondary | ICD-10-CM | POA: Diagnosis not present

## 2017-02-15 DIAGNOSIS — F329 Major depressive disorder, single episode, unspecified: Secondary | ICD-10-CM | POA: Diagnosis not present

## 2017-02-15 DIAGNOSIS — J449 Chronic obstructive pulmonary disease, unspecified: Secondary | ICD-10-CM | POA: Diagnosis not present

## 2017-02-16 DIAGNOSIS — R531 Weakness: Secondary | ICD-10-CM | POA: Diagnosis not present

## 2017-02-16 DIAGNOSIS — G20 Parkinson's disease: Secondary | ICD-10-CM | POA: Diagnosis not present

## 2017-02-16 DIAGNOSIS — J432 Centrilobular emphysema: Secondary | ICD-10-CM | POA: Diagnosis not present

## 2017-02-16 DIAGNOSIS — H2510 Age-related nuclear cataract, unspecified eye: Secondary | ICD-10-CM | POA: Diagnosis not present

## 2017-02-16 DIAGNOSIS — A0472 Enterocolitis due to Clostridium difficile, not specified as recurrent: Secondary | ICD-10-CM | POA: Diagnosis not present

## 2017-02-16 DIAGNOSIS — I82442 Acute embolism and thrombosis of left tibial vein: Secondary | ICD-10-CM | POA: Diagnosis not present

## 2017-02-17 DIAGNOSIS — F0281 Dementia in other diseases classified elsewhere with behavioral disturbance: Secondary | ICD-10-CM | POA: Diagnosis not present

## 2017-02-17 DIAGNOSIS — K529 Noninfective gastroenteritis and colitis, unspecified: Secondary | ICD-10-CM | POA: Diagnosis not present

## 2017-02-17 DIAGNOSIS — J449 Chronic obstructive pulmonary disease, unspecified: Secondary | ICD-10-CM | POA: Diagnosis not present

## 2017-02-17 DIAGNOSIS — H409 Unspecified glaucoma: Secondary | ICD-10-CM | POA: Diagnosis not present

## 2017-02-17 DIAGNOSIS — G20 Parkinson's disease: Secondary | ICD-10-CM | POA: Diagnosis not present

## 2017-02-17 DIAGNOSIS — F329 Major depressive disorder, single episode, unspecified: Secondary | ICD-10-CM | POA: Diagnosis not present

## 2017-02-18 DIAGNOSIS — K529 Noninfective gastroenteritis and colitis, unspecified: Secondary | ICD-10-CM | POA: Diagnosis not present

## 2017-02-18 DIAGNOSIS — J449 Chronic obstructive pulmonary disease, unspecified: Secondary | ICD-10-CM | POA: Diagnosis not present

## 2017-02-18 DIAGNOSIS — G20 Parkinson's disease: Secondary | ICD-10-CM | POA: Diagnosis not present

## 2017-02-18 DIAGNOSIS — F0281 Dementia in other diseases classified elsewhere with behavioral disturbance: Secondary | ICD-10-CM | POA: Diagnosis not present

## 2017-02-18 DIAGNOSIS — H409 Unspecified glaucoma: Secondary | ICD-10-CM | POA: Diagnosis not present

## 2017-02-18 DIAGNOSIS — F329 Major depressive disorder, single episode, unspecified: Secondary | ICD-10-CM | POA: Diagnosis not present

## 2017-02-20 DIAGNOSIS — F329 Major depressive disorder, single episode, unspecified: Secondary | ICD-10-CM | POA: Diagnosis not present

## 2017-02-20 DIAGNOSIS — F0281 Dementia in other diseases classified elsewhere with behavioral disturbance: Secondary | ICD-10-CM | POA: Diagnosis not present

## 2017-02-20 DIAGNOSIS — H409 Unspecified glaucoma: Secondary | ICD-10-CM | POA: Diagnosis not present

## 2017-02-20 DIAGNOSIS — J449 Chronic obstructive pulmonary disease, unspecified: Secondary | ICD-10-CM | POA: Diagnosis not present

## 2017-02-20 DIAGNOSIS — G20 Parkinson's disease: Secondary | ICD-10-CM | POA: Diagnosis not present

## 2017-02-20 DIAGNOSIS — K529 Noninfective gastroenteritis and colitis, unspecified: Secondary | ICD-10-CM | POA: Diagnosis not present

## 2017-02-21 DIAGNOSIS — F329 Major depressive disorder, single episode, unspecified: Secondary | ICD-10-CM | POA: Diagnosis not present

## 2017-02-21 DIAGNOSIS — G20 Parkinson's disease: Secondary | ICD-10-CM | POA: Diagnosis not present

## 2017-02-21 DIAGNOSIS — J449 Chronic obstructive pulmonary disease, unspecified: Secondary | ICD-10-CM | POA: Diagnosis not present

## 2017-02-21 DIAGNOSIS — H409 Unspecified glaucoma: Secondary | ICD-10-CM | POA: Diagnosis not present

## 2017-02-21 DIAGNOSIS — F0281 Dementia in other diseases classified elsewhere with behavioral disturbance: Secondary | ICD-10-CM | POA: Diagnosis not present

## 2017-02-21 DIAGNOSIS — K529 Noninfective gastroenteritis and colitis, unspecified: Secondary | ICD-10-CM | POA: Diagnosis not present

## 2017-02-22 DIAGNOSIS — K529 Noninfective gastroenteritis and colitis, unspecified: Secondary | ICD-10-CM | POA: Diagnosis not present

## 2017-02-22 DIAGNOSIS — G20 Parkinson's disease: Secondary | ICD-10-CM | POA: Diagnosis not present

## 2017-02-22 DIAGNOSIS — F0281 Dementia in other diseases classified elsewhere with behavioral disturbance: Secondary | ICD-10-CM | POA: Diagnosis not present

## 2017-02-22 DIAGNOSIS — H409 Unspecified glaucoma: Secondary | ICD-10-CM | POA: Diagnosis not present

## 2017-02-22 DIAGNOSIS — R1310 Dysphagia, unspecified: Secondary | ICD-10-CM | POA: Diagnosis not present

## 2017-02-22 DIAGNOSIS — J449 Chronic obstructive pulmonary disease, unspecified: Secondary | ICD-10-CM | POA: Diagnosis not present

## 2017-02-22 DIAGNOSIS — F329 Major depressive disorder, single episode, unspecified: Secondary | ICD-10-CM | POA: Diagnosis not present

## 2017-02-23 DIAGNOSIS — K529 Noninfective gastroenteritis and colitis, unspecified: Secondary | ICD-10-CM | POA: Diagnosis not present

## 2017-02-23 DIAGNOSIS — G20 Parkinson's disease: Secondary | ICD-10-CM | POA: Diagnosis not present

## 2017-02-23 DIAGNOSIS — H409 Unspecified glaucoma: Secondary | ICD-10-CM | POA: Diagnosis not present

## 2017-02-23 DIAGNOSIS — F329 Major depressive disorder, single episode, unspecified: Secondary | ICD-10-CM | POA: Diagnosis not present

## 2017-02-23 DIAGNOSIS — F0281 Dementia in other diseases classified elsewhere with behavioral disturbance: Secondary | ICD-10-CM | POA: Diagnosis not present

## 2017-02-23 DIAGNOSIS — J449 Chronic obstructive pulmonary disease, unspecified: Secondary | ICD-10-CM | POA: Diagnosis not present

## 2017-02-24 ENCOUNTER — Telehealth: Payer: Self-pay | Admitting: Neurology

## 2017-02-24 DIAGNOSIS — K529 Noninfective gastroenteritis and colitis, unspecified: Secondary | ICD-10-CM | POA: Diagnosis not present

## 2017-02-24 DIAGNOSIS — G20 Parkinson's disease: Secondary | ICD-10-CM | POA: Diagnosis not present

## 2017-02-24 DIAGNOSIS — F329 Major depressive disorder, single episode, unspecified: Secondary | ICD-10-CM | POA: Diagnosis not present

## 2017-02-24 DIAGNOSIS — H409 Unspecified glaucoma: Secondary | ICD-10-CM | POA: Diagnosis not present

## 2017-02-24 DIAGNOSIS — J449 Chronic obstructive pulmonary disease, unspecified: Secondary | ICD-10-CM | POA: Diagnosis not present

## 2017-02-24 DIAGNOSIS — F0281 Dementia in other diseases classified elsewhere with behavioral disturbance: Secondary | ICD-10-CM | POA: Diagnosis not present

## 2017-02-24 NOTE — Telephone Encounter (Signed)
I am not sure, what the question is: may have to hold PT until cleared by PCP for PT after recent hospital stay.

## 2017-02-24 NOTE — Telephone Encounter (Signed)
Any acute illness, stressor, etc may make PD symptoms worse.  Nothing further needed. FU as planned in Dec.

## 2017-02-24 NOTE — Telephone Encounter (Signed)
Physical Therapist Emelia Salisbury with [facility] has called to relay the following:  Pt has recovered from septtis, 2 weeks ago pt had CDiff, started an antibiotic. Pt had DVT on left leg, started Xeralto, since then to right leg has increased tone and weakness.  PT Lanora Manis states she doesn't know what to do, please call her at [phone]

## 2017-02-24 NOTE — Telephone Encounter (Addendum)
I called Lanora Manis, PT with [facility] back. She reports that after pt's hospitalization, pt's right leg tone and strength have changed. DVT was on the left leg. The only two new med are xarelto and an abx. She is unsure what is causing the change in the right leg, but feels that it is neurological. Pt's PCP is aware of this problem as well, per Lanora Manis. Lanora Manis does not feel that this is an emergency. I advised Lanora Manis that I will look for a sooner appt for pt with Dr. Frances Furbish. Lanora Manis feels that this is a good idea. Right now, there is not anything available until November.

## 2017-02-28 DIAGNOSIS — K529 Noninfective gastroenteritis and colitis, unspecified: Secondary | ICD-10-CM | POA: Diagnosis not present

## 2017-02-28 DIAGNOSIS — J449 Chronic obstructive pulmonary disease, unspecified: Secondary | ICD-10-CM | POA: Diagnosis not present

## 2017-02-28 DIAGNOSIS — G20 Parkinson's disease: Secondary | ICD-10-CM | POA: Diagnosis not present

## 2017-02-28 DIAGNOSIS — F329 Major depressive disorder, single episode, unspecified: Secondary | ICD-10-CM | POA: Diagnosis not present

## 2017-02-28 DIAGNOSIS — F0281 Dementia in other diseases classified elsewhere with behavioral disturbance: Secondary | ICD-10-CM | POA: Diagnosis not present

## 2017-02-28 DIAGNOSIS — H409 Unspecified glaucoma: Secondary | ICD-10-CM | POA: Diagnosis not present

## 2017-03-01 DIAGNOSIS — F0281 Dementia in other diseases classified elsewhere with behavioral disturbance: Secondary | ICD-10-CM | POA: Diagnosis not present

## 2017-03-01 DIAGNOSIS — K529 Noninfective gastroenteritis and colitis, unspecified: Secondary | ICD-10-CM | POA: Diagnosis not present

## 2017-03-01 DIAGNOSIS — F329 Major depressive disorder, single episode, unspecified: Secondary | ICD-10-CM | POA: Diagnosis not present

## 2017-03-01 DIAGNOSIS — J449 Chronic obstructive pulmonary disease, unspecified: Secondary | ICD-10-CM | POA: Diagnosis not present

## 2017-03-01 DIAGNOSIS — H409 Unspecified glaucoma: Secondary | ICD-10-CM | POA: Diagnosis not present

## 2017-03-01 DIAGNOSIS — G20 Parkinson's disease: Secondary | ICD-10-CM | POA: Diagnosis not present

## 2017-03-02 DIAGNOSIS — G20 Parkinson's disease: Secondary | ICD-10-CM | POA: Diagnosis not present

## 2017-03-02 DIAGNOSIS — J449 Chronic obstructive pulmonary disease, unspecified: Secondary | ICD-10-CM | POA: Diagnosis not present

## 2017-03-02 DIAGNOSIS — F0281 Dementia in other diseases classified elsewhere with behavioral disturbance: Secondary | ICD-10-CM | POA: Diagnosis not present

## 2017-03-02 DIAGNOSIS — F329 Major depressive disorder, single episode, unspecified: Secondary | ICD-10-CM | POA: Diagnosis not present

## 2017-03-02 DIAGNOSIS — H409 Unspecified glaucoma: Secondary | ICD-10-CM | POA: Diagnosis not present

## 2017-03-02 DIAGNOSIS — K529 Noninfective gastroenteritis and colitis, unspecified: Secondary | ICD-10-CM | POA: Diagnosis not present

## 2017-03-03 DIAGNOSIS — G20 Parkinson's disease: Secondary | ICD-10-CM | POA: Diagnosis not present

## 2017-03-03 DIAGNOSIS — H409 Unspecified glaucoma: Secondary | ICD-10-CM | POA: Diagnosis not present

## 2017-03-03 DIAGNOSIS — K529 Noninfective gastroenteritis and colitis, unspecified: Secondary | ICD-10-CM | POA: Diagnosis not present

## 2017-03-03 DIAGNOSIS — J449 Chronic obstructive pulmonary disease, unspecified: Secondary | ICD-10-CM | POA: Diagnosis not present

## 2017-03-03 DIAGNOSIS — F329 Major depressive disorder, single episode, unspecified: Secondary | ICD-10-CM | POA: Diagnosis not present

## 2017-03-03 DIAGNOSIS — F0281 Dementia in other diseases classified elsewhere with behavioral disturbance: Secondary | ICD-10-CM | POA: Diagnosis not present

## 2017-03-07 DIAGNOSIS — F329 Major depressive disorder, single episode, unspecified: Secondary | ICD-10-CM | POA: Diagnosis not present

## 2017-03-07 DIAGNOSIS — F0281 Dementia in other diseases classified elsewhere with behavioral disturbance: Secondary | ICD-10-CM | POA: Diagnosis not present

## 2017-03-07 DIAGNOSIS — H409 Unspecified glaucoma: Secondary | ICD-10-CM | POA: Diagnosis not present

## 2017-03-07 DIAGNOSIS — J449 Chronic obstructive pulmonary disease, unspecified: Secondary | ICD-10-CM | POA: Diagnosis not present

## 2017-03-07 DIAGNOSIS — K529 Noninfective gastroenteritis and colitis, unspecified: Secondary | ICD-10-CM | POA: Diagnosis not present

## 2017-03-07 DIAGNOSIS — G20 Parkinson's disease: Secondary | ICD-10-CM | POA: Diagnosis not present

## 2017-03-08 DIAGNOSIS — F329 Major depressive disorder, single episode, unspecified: Secondary | ICD-10-CM | POA: Diagnosis not present

## 2017-03-08 DIAGNOSIS — G20 Parkinson's disease: Secondary | ICD-10-CM | POA: Diagnosis not present

## 2017-03-08 DIAGNOSIS — H409 Unspecified glaucoma: Secondary | ICD-10-CM | POA: Diagnosis not present

## 2017-03-08 DIAGNOSIS — K529 Noninfective gastroenteritis and colitis, unspecified: Secondary | ICD-10-CM | POA: Diagnosis not present

## 2017-03-08 DIAGNOSIS — F0281 Dementia in other diseases classified elsewhere with behavioral disturbance: Secondary | ICD-10-CM | POA: Diagnosis not present

## 2017-03-08 DIAGNOSIS — J449 Chronic obstructive pulmonary disease, unspecified: Secondary | ICD-10-CM | POA: Diagnosis not present

## 2017-03-09 DIAGNOSIS — H409 Unspecified glaucoma: Secondary | ICD-10-CM | POA: Diagnosis not present

## 2017-03-09 DIAGNOSIS — F329 Major depressive disorder, single episode, unspecified: Secondary | ICD-10-CM | POA: Diagnosis not present

## 2017-03-09 DIAGNOSIS — F0281 Dementia in other diseases classified elsewhere with behavioral disturbance: Secondary | ICD-10-CM | POA: Diagnosis not present

## 2017-03-09 DIAGNOSIS — G20 Parkinson's disease: Secondary | ICD-10-CM | POA: Diagnosis not present

## 2017-03-09 DIAGNOSIS — K529 Noninfective gastroenteritis and colitis, unspecified: Secondary | ICD-10-CM | POA: Diagnosis not present

## 2017-03-09 DIAGNOSIS — J449 Chronic obstructive pulmonary disease, unspecified: Secondary | ICD-10-CM | POA: Diagnosis not present

## 2017-04-18 DIAGNOSIS — J449 Chronic obstructive pulmonary disease, unspecified: Secondary | ICD-10-CM | POA: Diagnosis not present

## 2017-04-18 DIAGNOSIS — I82442 Acute embolism and thrombosis of left tibial vein: Secondary | ICD-10-CM | POA: Diagnosis not present

## 2017-04-18 DIAGNOSIS — E039 Hypothyroidism, unspecified: Secondary | ICD-10-CM | POA: Diagnosis not present

## 2017-04-18 DIAGNOSIS — F329 Major depressive disorder, single episode, unspecified: Secondary | ICD-10-CM | POA: Diagnosis not present

## 2017-04-18 DIAGNOSIS — L89311 Pressure ulcer of right buttock, stage 1: Secondary | ICD-10-CM | POA: Diagnosis not present

## 2017-05-02 ENCOUNTER — Telehealth: Payer: Self-pay | Admitting: *Deleted

## 2017-05-02 NOTE — Telephone Encounter (Signed)
Spoke to pt and relayed that cancelled her appt Tuesday 12/11 due to weather and would r/s.  She did not make appt as [place] and would call back.  I did place on waitlist.

## 2017-05-03 ENCOUNTER — Ambulatory Visit: Payer: Medicare Other | Admitting: Adult Health

## 2017-05-04 ENCOUNTER — Ambulatory Visit: Payer: Self-pay | Admitting: Adult Health

## 2017-05-05 DIAGNOSIS — G20 Parkinson's disease: Secondary | ICD-10-CM | POA: Diagnosis not present

## 2017-05-05 DIAGNOSIS — R2681 Unsteadiness on feet: Secondary | ICD-10-CM | POA: Diagnosis not present

## 2017-05-05 DIAGNOSIS — M6281 Muscle weakness (generalized): Secondary | ICD-10-CM | POA: Diagnosis not present

## 2017-05-05 DIAGNOSIS — R2689 Other abnormalities of gait and mobility: Secondary | ICD-10-CM | POA: Diagnosis not present

## 2017-05-06 DIAGNOSIS — M6281 Muscle weakness (generalized): Secondary | ICD-10-CM | POA: Diagnosis not present

## 2017-05-06 DIAGNOSIS — R2681 Unsteadiness on feet: Secondary | ICD-10-CM | POA: Diagnosis not present

## 2017-05-06 DIAGNOSIS — G20 Parkinson's disease: Secondary | ICD-10-CM | POA: Diagnosis not present

## 2017-05-06 DIAGNOSIS — R2689 Other abnormalities of gait and mobility: Secondary | ICD-10-CM | POA: Diagnosis not present

## 2017-05-09 DIAGNOSIS — G20 Parkinson's disease: Secondary | ICD-10-CM | POA: Diagnosis not present

## 2017-05-09 DIAGNOSIS — R2689 Other abnormalities of gait and mobility: Secondary | ICD-10-CM | POA: Diagnosis not present

## 2017-05-09 DIAGNOSIS — M6281 Muscle weakness (generalized): Secondary | ICD-10-CM | POA: Diagnosis not present

## 2017-05-09 DIAGNOSIS — R2681 Unsteadiness on feet: Secondary | ICD-10-CM | POA: Diagnosis not present

## 2017-05-11 DIAGNOSIS — R2681 Unsteadiness on feet: Secondary | ICD-10-CM | POA: Diagnosis not present

## 2017-05-11 DIAGNOSIS — G20 Parkinson's disease: Secondary | ICD-10-CM | POA: Diagnosis not present

## 2017-05-11 DIAGNOSIS — R2689 Other abnormalities of gait and mobility: Secondary | ICD-10-CM | POA: Diagnosis not present

## 2017-05-11 DIAGNOSIS — M6281 Muscle weakness (generalized): Secondary | ICD-10-CM | POA: Diagnosis not present

## 2017-05-12 DIAGNOSIS — G20 Parkinson's disease: Secondary | ICD-10-CM | POA: Diagnosis not present

## 2017-05-12 DIAGNOSIS — R2681 Unsteadiness on feet: Secondary | ICD-10-CM | POA: Diagnosis not present

## 2017-05-12 DIAGNOSIS — R2689 Other abnormalities of gait and mobility: Secondary | ICD-10-CM | POA: Diagnosis not present

## 2017-05-12 DIAGNOSIS — M6281 Muscle weakness (generalized): Secondary | ICD-10-CM | POA: Diagnosis not present

## 2017-05-13 ENCOUNTER — Telehealth: Payer: Self-pay | Admitting: Neurology

## 2017-05-13 NOTE — Telephone Encounter (Signed)
Pt's husband called she is having an increase in hallucinations, she thinks her husband is doing things he is not. She did not believe the hallucinations were real until after being hospitalized in August. The episodes have increased. Pt's husband is concerned she will call the police bc of what she sees. Please call to advise asap.

## 2017-05-15 DIAGNOSIS — M6281 Muscle weakness (generalized): Secondary | ICD-10-CM | POA: Diagnosis not present

## 2017-05-15 DIAGNOSIS — R2689 Other abnormalities of gait and mobility: Secondary | ICD-10-CM | POA: Diagnosis not present

## 2017-05-15 DIAGNOSIS — R2681 Unsteadiness on feet: Secondary | ICD-10-CM | POA: Diagnosis not present

## 2017-05-15 DIAGNOSIS — G20 Parkinson's disease: Secondary | ICD-10-CM | POA: Diagnosis not present

## 2017-05-18 MED ORDER — PIMAVANSERIN TARTRATE 17 MG PO TABS: 17.0000 mg | ORAL_TABLET | Freq: Every day | ORAL | 1 refills | Status: DC

## 2017-05-18 NOTE — Telephone Encounter (Signed)
I called and talked to the patient.  The patient is having events of what she thinks are hallucinations, she indicates that she will go to sleep at night and wake up and thinks that she has seen something or something has happened.  She may be having vivid dreams that she cannot tell the difference between reality or the dream.  I talk with husband.  The husband indicates that she is having problems with hallucinations and delusional thinking throughout the day.  This is not just at nighttime.  The patient is on Celexa which has an interaction with prolonged QT interval with both Seroquel and Nuplazid.  They will drop the Celexa dose to 20 mg daily for 2 weeks and then stop it.  Will start Nuplazid in low-dose.

## 2017-05-19 DIAGNOSIS — R2681 Unsteadiness on feet: Secondary | ICD-10-CM | POA: Diagnosis not present

## 2017-05-19 DIAGNOSIS — G20 Parkinson's disease: Secondary | ICD-10-CM | POA: Diagnosis not present

## 2017-05-19 DIAGNOSIS — R2689 Other abnormalities of gait and mobility: Secondary | ICD-10-CM | POA: Diagnosis not present

## 2017-05-19 DIAGNOSIS — M6281 Muscle weakness (generalized): Secondary | ICD-10-CM | POA: Diagnosis not present

## 2017-05-20 DIAGNOSIS — R2681 Unsteadiness on feet: Secondary | ICD-10-CM | POA: Diagnosis not present

## 2017-05-20 DIAGNOSIS — M6281 Muscle weakness (generalized): Secondary | ICD-10-CM | POA: Diagnosis not present

## 2017-05-20 DIAGNOSIS — G20 Parkinson's disease: Secondary | ICD-10-CM | POA: Diagnosis not present

## 2017-05-20 DIAGNOSIS — R2689 Other abnormalities of gait and mobility: Secondary | ICD-10-CM | POA: Diagnosis not present

## 2017-05-23 DIAGNOSIS — M6281 Muscle weakness (generalized): Secondary | ICD-10-CM | POA: Diagnosis not present

## 2017-05-23 DIAGNOSIS — G20 Parkinson's disease: Secondary | ICD-10-CM | POA: Diagnosis not present

## 2017-05-23 DIAGNOSIS — R2689 Other abnormalities of gait and mobility: Secondary | ICD-10-CM | POA: Diagnosis not present

## 2017-05-23 DIAGNOSIS — R2681 Unsteadiness on feet: Secondary | ICD-10-CM | POA: Diagnosis not present

## 2017-05-25 ENCOUNTER — Telehealth: Payer: Self-pay | Admitting: *Deleted

## 2017-05-25 DIAGNOSIS — R2681 Unsteadiness on feet: Secondary | ICD-10-CM | POA: Diagnosis not present

## 2017-05-25 DIAGNOSIS — M6281 Muscle weakness (generalized): Secondary | ICD-10-CM | POA: Diagnosis not present

## 2017-05-25 DIAGNOSIS — G20 Parkinson's disease: Secondary | ICD-10-CM | POA: Diagnosis not present

## 2017-05-25 DIAGNOSIS — R2689 Other abnormalities of gait and mobility: Secondary | ICD-10-CM | POA: Diagnosis not present

## 2017-05-25 NOTE — Telephone Encounter (Signed)
Received fax notification from [facility] that rx nuplazid pending benefit investigation. No action required, FYI.

## 2017-05-27 DIAGNOSIS — G20 Parkinson's disease: Secondary | ICD-10-CM | POA: Diagnosis not present

## 2017-05-27 DIAGNOSIS — M6281 Muscle weakness (generalized): Secondary | ICD-10-CM | POA: Diagnosis not present

## 2017-05-27 DIAGNOSIS — R2689 Other abnormalities of gait and mobility: Secondary | ICD-10-CM | POA: Diagnosis not present

## 2017-05-27 DIAGNOSIS — R2681 Unsteadiness on feet: Secondary | ICD-10-CM | POA: Diagnosis not present

## 2017-05-31 ENCOUNTER — Telehealth: Payer: Self-pay | Admitting: Adult Health

## 2017-05-31 MED ORDER — PIMAVANSERIN TARTRATE 10 MG PO TABS: 10.0000 mg | ORAL_TABLET | Freq: Every day | ORAL | 1 refills | Status: AC

## 2017-05-31 NOTE — Addendum Note (Signed)
Addended by: York SpanielWILLIS, CHARLES K on: 05/31/2017 04:39 PM   Modules accepted: Orders

## 2017-05-31 NOTE — Telephone Encounter (Signed)
Nuplazid is no longer available at the 17 mg dosing, only available at 10 mg and 34 mg.  Patient was on 17 mg daily, I will switch her to the 10 mg daily dosing.

## 2017-05-31 NOTE — Telephone Encounter (Signed)
Jessica from [facility] has called stating that re: pt:'s Pimavanserin Tartrate (NUPLAZID) 17 MG TABS the mfg no longer makes this in 17 mg  Only as 10 mg and 34 mg.  Shanda BumpsJessica states a new prescription is needed urgently, please call [phone] or fax [phone]  E-Fax [address] [place], [place]

## 2017-06-01 NOTE — Telephone Encounter (Signed)
Brittany Crosby with [facility] calling saying they got the fax that was sent but need a PA. Brittany Crosby said to fax it to [phone]

## 2017-06-01 NOTE — Telephone Encounter (Signed)
Faxed printed/signed rx Nuplazid 10mg  to [facility] at [phone]. Received fax confirmation.

## 2017-06-01 NOTE — Telephone Encounter (Signed)
Brittany PancoastMary Clare- Megan last saw the patient. This is a Dr. Frances FurbishAthar patient. Not a patient of Dr. Anne HahnWillis'. PA needed on rx Nuplazid 10mg 

## 2017-06-01 NOTE — Telephone Encounter (Signed)
Brittany Crosby/[facility] called said they have not rec'd the fax for the refill. She was advised it was faxed and confirmation rec'd, she is asking it be refaxed to [phone]

## 2017-06-02 NOTE — Telephone Encounter (Signed)
[facility] stating Nuplazid requires PA. Called [employer], spoke with Janelle to answer clinical questions.  Will be 24-72 hours for decision. Ref # [id number].  [phone] [employer] clinical pharmacy review number for questions or to check status.

## 2017-06-06 DIAGNOSIS — R441 Visual hallucinations: Secondary | ICD-10-CM | POA: Diagnosis not present

## 2017-06-06 DIAGNOSIS — E039 Hypothyroidism, unspecified: Secondary | ICD-10-CM | POA: Diagnosis not present

## 2017-06-06 DIAGNOSIS — D649 Anemia, unspecified: Secondary | ICD-10-CM | POA: Diagnosis not present

## 2017-06-06 DIAGNOSIS — F0390 Unspecified dementia without behavioral disturbance: Secondary | ICD-10-CM | POA: Diagnosis not present

## 2017-06-06 DIAGNOSIS — R197 Diarrhea, unspecified: Secondary | ICD-10-CM | POA: Diagnosis not present

## 2017-06-06 DIAGNOSIS — Z86718 Personal history of other venous thrombosis and embolism: Secondary | ICD-10-CM | POA: Diagnosis not present

## 2017-06-06 DIAGNOSIS — G20 Parkinson's disease: Secondary | ICD-10-CM | POA: Diagnosis not present

## 2017-06-06 NOTE — Telephone Encounter (Signed)
Received [facility] from [facility] advising Nuplazid is covered x 2 years for 30 day supply at a time under Medicare Part D. It will not be covered for a 90 day supply.  The patient should have received copy of letter as well. Left number for any questions.  OMB approval No. [id number], expires 07/22/2018

## 2017-06-08 DIAGNOSIS — R441 Visual hallucinations: Secondary | ICD-10-CM | POA: Diagnosis not present

## 2017-06-08 DIAGNOSIS — F0390 Unspecified dementia without behavioral disturbance: Secondary | ICD-10-CM | POA: Diagnosis not present

## 2017-06-08 DIAGNOSIS — Z86718 Personal history of other venous thrombosis and embolism: Secondary | ICD-10-CM | POA: Diagnosis not present

## 2017-06-08 DIAGNOSIS — G20 Parkinson's disease: Secondary | ICD-10-CM | POA: Diagnosis not present

## 2017-06-08 DIAGNOSIS — R197 Diarrhea, unspecified: Secondary | ICD-10-CM | POA: Diagnosis not present

## 2017-06-08 DIAGNOSIS — E039 Hypothyroidism, unspecified: Secondary | ICD-10-CM | POA: Diagnosis not present

## 2017-06-09 DIAGNOSIS — K5730 Diverticulosis of large intestine without perforation or abscess without bleeding: Secondary | ICD-10-CM | POA: Diagnosis not present

## 2017-06-09 DIAGNOSIS — N390 Urinary tract infection, site not specified: Secondary | ICD-10-CM | POA: Diagnosis not present

## 2017-06-09 DIAGNOSIS — K838 Other specified diseases of biliary tract: Secondary | ICD-10-CM | POA: Diagnosis not present

## 2017-06-09 DIAGNOSIS — E876 Hypokalemia: Secondary | ICD-10-CM | POA: Diagnosis not present

## 2017-06-09 DIAGNOSIS — N281 Cyst of kidney, acquired: Secondary | ICD-10-CM | POA: Diagnosis not present

## 2017-06-09 DIAGNOSIS — Z0389 Encounter for observation for other suspected diseases and conditions ruled out: Secondary | ICD-10-CM | POA: Diagnosis not present

## 2017-06-09 DIAGNOSIS — N368 Other specified disorders of urethra: Secondary | ICD-10-CM | POA: Diagnosis not present

## 2017-06-09 DIAGNOSIS — G20 Parkinson's disease: Secondary | ICD-10-CM | POA: Diagnosis not present

## 2017-06-09 DIAGNOSIS — Z743 Need for continuous supervision: Secondary | ICD-10-CM | POA: Diagnosis not present

## 2017-06-09 DIAGNOSIS — E860 Dehydration: Secondary | ICD-10-CM | POA: Diagnosis not present

## 2017-06-09 DIAGNOSIS — B9689 Other specified bacterial agents as the cause of diseases classified elsewhere: Secondary | ICD-10-CM | POA: Diagnosis not present

## 2017-06-09 DIAGNOSIS — A0471 Enterocolitis due to Clostridium difficile, recurrent: Secondary | ICD-10-CM | POA: Diagnosis not present

## 2017-06-09 DIAGNOSIS — R531 Weakness: Secondary | ICD-10-CM | POA: Diagnosis not present

## 2017-06-09 DIAGNOSIS — R197 Diarrhea, unspecified: Secondary | ICD-10-CM | POA: Diagnosis not present

## 2017-06-09 DIAGNOSIS — K529 Noninfective gastroenteritis and colitis, unspecified: Secondary | ICD-10-CM | POA: Diagnosis not present

## 2017-06-09 DIAGNOSIS — R109 Unspecified abdominal pain: Secondary | ICD-10-CM | POA: Diagnosis not present

## 2017-06-09 DIAGNOSIS — Z681 Body mass index (BMI) 19 or less, adult: Secondary | ICD-10-CM | POA: Diagnosis not present

## 2017-06-09 DIAGNOSIS — N9489 Other specified conditions associated with female genital organs and menstrual cycle: Secondary | ICD-10-CM | POA: Diagnosis not present

## 2017-06-09 DIAGNOSIS — G301 Alzheimer's disease with late onset: Secondary | ICD-10-CM | POA: Diagnosis not present

## 2017-06-09 DIAGNOSIS — E46 Unspecified protein-calorie malnutrition: Secondary | ICD-10-CM | POA: Diagnosis not present

## 2017-06-09 DIAGNOSIS — K8689 Other specified diseases of pancreas: Secondary | ICD-10-CM | POA: Diagnosis not present

## 2017-06-09 DIAGNOSIS — F0280 Dementia in other diseases classified elsewhere without behavioral disturbance: Secondary | ICD-10-CM | POA: Diagnosis not present

## 2017-06-10 DIAGNOSIS — E039 Hypothyroidism, unspecified: Secondary | ICD-10-CM | POA: Diagnosis present

## 2017-06-10 DIAGNOSIS — A0472 Enterocolitis due to Clostridium difficile, not specified as recurrent: Secondary | ICD-10-CM | POA: Diagnosis not present

## 2017-06-10 DIAGNOSIS — E46 Unspecified protein-calorie malnutrition: Secondary | ICD-10-CM | POA: Diagnosis present

## 2017-06-10 DIAGNOSIS — W19XXXD Unspecified fall, subsequent encounter: Secondary | ICD-10-CM | POA: Diagnosis not present

## 2017-06-10 DIAGNOSIS — Z7901 Long term (current) use of anticoagulants: Secondary | ICD-10-CM | POA: Diagnosis not present

## 2017-06-10 DIAGNOSIS — R0602 Shortness of breath: Secondary | ICD-10-CM | POA: Diagnosis not present

## 2017-06-10 DIAGNOSIS — G20 Parkinson's disease: Secondary | ICD-10-CM | POA: Diagnosis present

## 2017-06-10 DIAGNOSIS — E876 Hypokalemia: Secondary | ICD-10-CM | POA: Diagnosis not present

## 2017-06-10 DIAGNOSIS — A0471 Enterocolitis due to Clostridium difficile, recurrent: Secondary | ICD-10-CM | POA: Diagnosis not present

## 2017-06-10 DIAGNOSIS — G309 Alzheimer's disease, unspecified: Secondary | ICD-10-CM | POA: Diagnosis present

## 2017-06-10 DIAGNOSIS — Z0389 Encounter for observation for other suspected diseases and conditions ruled out: Secondary | ICD-10-CM | POA: Diagnosis not present

## 2017-06-10 DIAGNOSIS — J449 Chronic obstructive pulmonary disease, unspecified: Secondary | ICD-10-CM | POA: Diagnosis present

## 2017-06-10 DIAGNOSIS — M6281 Muscle weakness (generalized): Secondary | ICD-10-CM | POA: Diagnosis not present

## 2017-06-10 DIAGNOSIS — K5730 Diverticulosis of large intestine without perforation or abscess without bleeding: Secondary | ICD-10-CM | POA: Diagnosis present

## 2017-06-10 DIAGNOSIS — F329 Major depressive disorder, single episode, unspecified: Secondary | ICD-10-CM | POA: Diagnosis present

## 2017-06-10 DIAGNOSIS — Z86718 Personal history of other venous thrombosis and embolism: Secondary | ICD-10-CM | POA: Diagnosis not present

## 2017-06-10 DIAGNOSIS — R4182 Altered mental status, unspecified: Secondary | ICD-10-CM | POA: Diagnosis not present

## 2017-06-10 DIAGNOSIS — Z888 Allergy status to other drugs, medicaments and biological substances status: Secondary | ICD-10-CM | POA: Diagnosis not present

## 2017-06-10 DIAGNOSIS — R197 Diarrhea, unspecified: Secondary | ICD-10-CM | POA: Diagnosis not present

## 2017-06-10 DIAGNOSIS — Z681 Body mass index (BMI) 19 or less, adult: Secondary | ICD-10-CM | POA: Diagnosis not present

## 2017-06-10 DIAGNOSIS — Z880 Allergy status to penicillin: Secondary | ICD-10-CM | POA: Diagnosis not present

## 2017-06-10 DIAGNOSIS — F0390 Unspecified dementia without behavioral disturbance: Secondary | ICD-10-CM | POA: Diagnosis not present

## 2017-06-10 DIAGNOSIS — F0280 Dementia in other diseases classified elsewhere without behavioral disturbance: Secondary | ICD-10-CM | POA: Diagnosis present

## 2017-06-10 DIAGNOSIS — Z885 Allergy status to narcotic agent status: Secondary | ICD-10-CM | POA: Diagnosis not present

## 2017-06-10 DIAGNOSIS — G301 Alzheimer's disease with late onset: Secondary | ICD-10-CM | POA: Diagnosis present

## 2017-06-10 DIAGNOSIS — K6289 Other specified diseases of anus and rectum: Secondary | ICD-10-CM | POA: Diagnosis present

## 2017-06-10 DIAGNOSIS — K219 Gastro-esophageal reflux disease without esophagitis: Secondary | ICD-10-CM | POA: Diagnosis present

## 2017-06-10 DIAGNOSIS — Z87891 Personal history of nicotine dependence: Secondary | ICD-10-CM | POA: Diagnosis not present

## 2017-06-10 DIAGNOSIS — Z79899 Other long term (current) drug therapy: Secondary | ICD-10-CM | POA: Diagnosis not present

## 2017-06-15 DIAGNOSIS — G309 Alzheimer's disease, unspecified: Secondary | ICD-10-CM | POA: Diagnosis present

## 2017-06-15 DIAGNOSIS — A0472 Enterocolitis due to Clostridium difficile, not specified as recurrent: Secondary | ICD-10-CM | POA: Diagnosis present

## 2017-06-15 DIAGNOSIS — F0280 Dementia in other diseases classified elsewhere without behavioral disturbance: Secondary | ICD-10-CM | POA: Diagnosis present

## 2017-06-15 DIAGNOSIS — E441 Mild protein-calorie malnutrition: Secondary | ICD-10-CM | POA: Diagnosis present

## 2017-06-15 DIAGNOSIS — E039 Hypothyroidism, unspecified: Secondary | ICD-10-CM | POA: Diagnosis present

## 2017-06-15 DIAGNOSIS — F05 Delirium due to known physiological condition: Secondary | ICD-10-CM | POA: Diagnosis not present

## 2017-06-15 DIAGNOSIS — E876 Hypokalemia: Secondary | ICD-10-CM | POA: Diagnosis present

## 2017-06-15 DIAGNOSIS — I952 Hypotension due to drugs: Secondary | ICD-10-CM | POA: Diagnosis not present

## 2017-06-15 DIAGNOSIS — F0390 Unspecified dementia without behavioral disturbance: Secondary | ICD-10-CM | POA: Diagnosis not present

## 2017-06-15 DIAGNOSIS — Z221 Carrier of other intestinal infectious diseases: Secondary | ICD-10-CM | POA: Diagnosis not present

## 2017-06-15 DIAGNOSIS — S3210XA Unspecified fracture of sacrum, initial encounter for closed fracture: Secondary | ICD-10-CM | POA: Diagnosis not present

## 2017-06-15 DIAGNOSIS — M25552 Pain in left hip: Secondary | ICD-10-CM | POA: Diagnosis not present

## 2017-06-15 DIAGNOSIS — F0391 Unspecified dementia with behavioral disturbance: Secondary | ICD-10-CM | POA: Diagnosis not present

## 2017-06-15 DIAGNOSIS — Z7901 Long term (current) use of anticoagulants: Secondary | ICD-10-CM | POA: Diagnosis not present

## 2017-06-15 DIAGNOSIS — S32591A Other specified fracture of right pubis, initial encounter for closed fracture: Secondary | ICD-10-CM | POA: Diagnosis present

## 2017-06-15 DIAGNOSIS — S0990XA Unspecified injury of head, initial encounter: Secondary | ICD-10-CM | POA: Diagnosis not present

## 2017-06-15 DIAGNOSIS — G20 Parkinson's disease: Secondary | ICD-10-CM | POA: Diagnosis present

## 2017-06-15 DIAGNOSIS — T1490XA Injury, unspecified, initial encounter: Secondary | ICD-10-CM | POA: Diagnosis not present

## 2017-06-15 DIAGNOSIS — S32592A Other specified fracture of left pubis, initial encounter for closed fracture: Secondary | ICD-10-CM | POA: Diagnosis not present

## 2017-06-15 DIAGNOSIS — J449 Chronic obstructive pulmonary disease, unspecified: Secondary | ICD-10-CM | POA: Diagnosis present

## 2017-06-15 DIAGNOSIS — T402X5A Adverse effect of other opioids, initial encounter: Secondary | ICD-10-CM | POA: Diagnosis not present

## 2017-06-15 DIAGNOSIS — R0602 Shortness of breath: Secondary | ICD-10-CM | POA: Diagnosis not present

## 2017-06-15 DIAGNOSIS — M6281 Muscle weakness (generalized): Secondary | ICD-10-CM | POA: Diagnosis not present

## 2017-06-15 DIAGNOSIS — B9689 Other specified bacterial agents as the cause of diseases classified elsewhere: Secondary | ICD-10-CM | POA: Diagnosis not present

## 2017-06-15 DIAGNOSIS — R531 Weakness: Secondary | ICD-10-CM | POA: Diagnosis not present

## 2017-06-15 DIAGNOSIS — F329 Major depressive disorder, single episode, unspecified: Secondary | ICD-10-CM | POA: Diagnosis not present

## 2017-06-15 DIAGNOSIS — Z743 Need for continuous supervision: Secondary | ICD-10-CM | POA: Diagnosis not present

## 2017-06-15 DIAGNOSIS — N390 Urinary tract infection, site not specified: Secondary | ICD-10-CM | POA: Diagnosis present

## 2017-06-15 DIAGNOSIS — Z6820 Body mass index (BMI) 20.0-20.9, adult: Secondary | ICD-10-CM | POA: Diagnosis not present

## 2017-06-15 DIAGNOSIS — R197 Diarrhea, unspecified: Secondary | ICD-10-CM | POA: Diagnosis not present

## 2017-06-15 DIAGNOSIS — S0003XA Contusion of scalp, initial encounter: Secondary | ICD-10-CM | POA: Diagnosis present

## 2017-06-15 DIAGNOSIS — Z86718 Personal history of other venous thrombosis and embolism: Secondary | ICD-10-CM | POA: Diagnosis not present

## 2017-06-15 DIAGNOSIS — K219 Gastro-esophageal reflux disease without esophagitis: Secondary | ICD-10-CM | POA: Diagnosis present

## 2017-06-15 DIAGNOSIS — S32119A Unspecified Zone I fracture of sacrum, initial encounter for closed fracture: Secondary | ICD-10-CM | POA: Diagnosis present

## 2017-06-15 DIAGNOSIS — W19XXXD Unspecified fall, subsequent encounter: Secondary | ICD-10-CM | POA: Diagnosis not present

## 2017-06-16 DIAGNOSIS — F0391 Unspecified dementia with behavioral disturbance: Secondary | ICD-10-CM | POA: Diagnosis not present

## 2017-06-16 DIAGNOSIS — M6281 Muscle weakness (generalized): Secondary | ICD-10-CM | POA: Diagnosis not present

## 2017-06-16 DIAGNOSIS — F05 Delirium due to known physiological condition: Secondary | ICD-10-CM | POA: Diagnosis not present

## 2017-06-16 DIAGNOSIS — A0472 Enterocolitis due to Clostridium difficile, not specified as recurrent: Secondary | ICD-10-CM | POA: Diagnosis not present

## 2017-06-23 DIAGNOSIS — F329 Major depressive disorder, single episode, unspecified: Secondary | ICD-10-CM | POA: Diagnosis not present

## 2017-06-23 DIAGNOSIS — R531 Weakness: Secondary | ICD-10-CM | POA: Diagnosis not present

## 2017-06-23 DIAGNOSIS — B9689 Other specified bacterial agents as the cause of diseases classified elsewhere: Secondary | ICD-10-CM | POA: Diagnosis not present

## 2017-06-23 DIAGNOSIS — F0390 Unspecified dementia without behavioral disturbance: Secondary | ICD-10-CM | POA: Diagnosis not present

## 2017-06-23 DIAGNOSIS — G20 Parkinson's disease: Secondary | ICD-10-CM | POA: Diagnosis not present

## 2017-06-29 DIAGNOSIS — Z221 Carrier of other intestinal infectious diseases: Secondary | ICD-10-CM | POA: Diagnosis not present

## 2017-06-29 DIAGNOSIS — F329 Major depressive disorder, single episode, unspecified: Secondary | ICD-10-CM | POA: Diagnosis not present

## 2017-06-29 DIAGNOSIS — G20 Parkinson's disease: Secondary | ICD-10-CM | POA: Diagnosis not present

## 2017-06-29 DIAGNOSIS — R531 Weakness: Secondary | ICD-10-CM | POA: Diagnosis not present

## 2017-07-03 DIAGNOSIS — W19XXXD Unspecified fall, subsequent encounter: Secondary | ICD-10-CM | POA: Diagnosis not present

## 2017-07-03 DIAGNOSIS — J449 Chronic obstructive pulmonary disease, unspecified: Secondary | ICD-10-CM | POA: Diagnosis not present

## 2017-07-03 DIAGNOSIS — Z86718 Personal history of other venous thrombosis and embolism: Secondary | ICD-10-CM | POA: Diagnosis not present

## 2017-07-03 DIAGNOSIS — E876 Hypokalemia: Secondary | ICD-10-CM | POA: Diagnosis present

## 2017-07-03 DIAGNOSIS — S32810A Multiple fractures of pelvis with stable disruption of pelvic ring, initial encounter for closed fracture: Secondary | ICD-10-CM | POA: Diagnosis not present

## 2017-07-03 DIAGNOSIS — I952 Hypotension due to drugs: Secondary | ICD-10-CM | POA: Diagnosis not present

## 2017-07-03 DIAGNOSIS — Z6820 Body mass index (BMI) 20.0-20.9, adult: Secondary | ICD-10-CM | POA: Diagnosis not present

## 2017-07-03 DIAGNOSIS — Z743 Need for continuous supervision: Secondary | ICD-10-CM | POA: Diagnosis not present

## 2017-07-03 DIAGNOSIS — S32591A Other specified fracture of right pubis, initial encounter for closed fracture: Secondary | ICD-10-CM | POA: Diagnosis not present

## 2017-07-03 DIAGNOSIS — F0280 Dementia in other diseases classified elsewhere without behavioral disturbance: Secondary | ICD-10-CM | POA: Diagnosis present

## 2017-07-03 DIAGNOSIS — Z7901 Long term (current) use of anticoagulants: Secondary | ICD-10-CM | POA: Diagnosis not present

## 2017-07-03 DIAGNOSIS — T1490XA Injury, unspecified, initial encounter: Secondary | ICD-10-CM | POA: Diagnosis not present

## 2017-07-03 DIAGNOSIS — M25552 Pain in left hip: Secondary | ICD-10-CM | POA: Diagnosis not present

## 2017-07-03 DIAGNOSIS — E441 Mild protein-calorie malnutrition: Secondary | ICD-10-CM | POA: Diagnosis not present

## 2017-07-03 DIAGNOSIS — R2689 Other abnormalities of gait and mobility: Secondary | ICD-10-CM | POA: Diagnosis not present

## 2017-07-03 DIAGNOSIS — E039 Hypothyroidism, unspecified: Secondary | ICD-10-CM | POA: Diagnosis not present

## 2017-07-03 DIAGNOSIS — N390 Urinary tract infection, site not specified: Secondary | ICD-10-CM | POA: Diagnosis not present

## 2017-07-03 DIAGNOSIS — S0003XA Contusion of scalp, initial encounter: Secondary | ICD-10-CM | POA: Diagnosis present

## 2017-07-03 DIAGNOSIS — T402X5A Adverse effect of other opioids, initial encounter: Secondary | ICD-10-CM | POA: Diagnosis not present

## 2017-07-03 DIAGNOSIS — S32119A Unspecified Zone I fracture of sacrum, initial encounter for closed fracture: Secondary | ICD-10-CM | POA: Diagnosis not present

## 2017-07-03 DIAGNOSIS — I82409 Acute embolism and thrombosis of unspecified deep veins of unspecified lower extremity: Secondary | ICD-10-CM | POA: Diagnosis not present

## 2017-07-03 DIAGNOSIS — A0472 Enterocolitis due to Clostridium difficile, not specified as recurrent: Secondary | ICD-10-CM | POA: Diagnosis not present

## 2017-07-03 DIAGNOSIS — W19XXXA Unspecified fall, initial encounter: Secondary | ICD-10-CM | POA: Diagnosis not present

## 2017-07-03 DIAGNOSIS — F0390 Unspecified dementia without behavioral disturbance: Secondary | ICD-10-CM | POA: Diagnosis not present

## 2017-07-03 DIAGNOSIS — F329 Major depressive disorder, single episode, unspecified: Secondary | ICD-10-CM | POA: Diagnosis not present

## 2017-07-03 DIAGNOSIS — G309 Alzheimer's disease, unspecified: Secondary | ICD-10-CM | POA: Diagnosis not present

## 2017-07-03 DIAGNOSIS — S32592A Other specified fracture of left pubis, initial encounter for closed fracture: Secondary | ICD-10-CM | POA: Diagnosis not present

## 2017-07-03 DIAGNOSIS — R0602 Shortness of breath: Secondary | ICD-10-CM | POA: Diagnosis not present

## 2017-07-03 DIAGNOSIS — S0990XA Unspecified injury of head, initial encounter: Secondary | ICD-10-CM | POA: Diagnosis not present

## 2017-07-03 DIAGNOSIS — G20 Parkinson's disease: Secondary | ICD-10-CM | POA: Diagnosis not present

## 2017-07-03 DIAGNOSIS — S3210XA Unspecified fracture of sacrum, initial encounter for closed fracture: Secondary | ICD-10-CM | POA: Diagnosis not present

## 2017-07-03 DIAGNOSIS — K219 Gastro-esophageal reflux disease without esophagitis: Secondary | ICD-10-CM | POA: Diagnosis present

## 2017-07-05 ENCOUNTER — Ambulatory Visit: Payer: Medicare Other | Admitting: Adult Health

## 2017-07-06 ENCOUNTER — Encounter: Payer: Self-pay | Admitting: Adult Health

## 2017-07-06 DIAGNOSIS — I82409 Acute embolism and thrombosis of unspecified deep veins of unspecified lower extremity: Secondary | ICD-10-CM | POA: Diagnosis not present

## 2017-07-06 DIAGNOSIS — S32810A Multiple fractures of pelvis with stable disruption of pelvic ring, initial encounter for closed fracture: Secondary | ICD-10-CM | POA: Diagnosis not present

## 2017-07-06 DIAGNOSIS — R0602 Shortness of breath: Secondary | ICD-10-CM | POA: Diagnosis not present

## 2017-07-06 DIAGNOSIS — R2689 Other abnormalities of gait and mobility: Secondary | ICD-10-CM | POA: Diagnosis not present

## 2017-07-06 DIAGNOSIS — K219 Gastro-esophageal reflux disease without esophagitis: Secondary | ICD-10-CM | POA: Diagnosis not present

## 2017-07-06 DIAGNOSIS — G20 Parkinson's disease: Secondary | ICD-10-CM | POA: Diagnosis not present

## 2017-07-06 DIAGNOSIS — W19XXXA Unspecified fall, initial encounter: Secondary | ICD-10-CM | POA: Diagnosis not present

## 2017-07-06 DIAGNOSIS — M25552 Pain in left hip: Secondary | ICD-10-CM | POA: Diagnosis not present

## 2017-07-06 DIAGNOSIS — S32592A Other specified fracture of left pubis, initial encounter for closed fracture: Secondary | ICD-10-CM | POA: Diagnosis not present

## 2017-07-06 DIAGNOSIS — G309 Alzheimer's disease, unspecified: Secondary | ICD-10-CM | POA: Diagnosis not present

## 2017-07-06 DIAGNOSIS — J449 Chronic obstructive pulmonary disease, unspecified: Secondary | ICD-10-CM | POA: Diagnosis not present

## 2017-07-06 DIAGNOSIS — F0390 Unspecified dementia without behavioral disturbance: Secondary | ICD-10-CM | POA: Diagnosis not present

## 2017-07-06 DIAGNOSIS — Z743 Need for continuous supervision: Secondary | ICD-10-CM | POA: Diagnosis not present

## 2017-07-06 DIAGNOSIS — S32591A Other specified fracture of right pubis, initial encounter for closed fracture: Secondary | ICD-10-CM | POA: Diagnosis not present

## 2017-07-06 DIAGNOSIS — A0472 Enterocolitis due to Clostridium difficile, not specified as recurrent: Secondary | ICD-10-CM | POA: Diagnosis not present

## 2017-07-06 DIAGNOSIS — E039 Hypothyroidism, unspecified: Secondary | ICD-10-CM | POA: Diagnosis not present

## 2017-07-06 DIAGNOSIS — S3210XA Unspecified fracture of sacrum, initial encounter for closed fracture: Secondary | ICD-10-CM | POA: Diagnosis not present

## 2017-07-06 DIAGNOSIS — W19XXXD Unspecified fall, subsequent encounter: Secondary | ICD-10-CM | POA: Diagnosis not present

## 2017-07-06 DIAGNOSIS — F329 Major depressive disorder, single episode, unspecified: Secondary | ICD-10-CM | POA: Diagnosis not present

## 2017-07-06 DIAGNOSIS — F332 Major depressive disorder, recurrent severe without psychotic features: Secondary | ICD-10-CM | POA: Diagnosis not present

## 2017-07-21 DIAGNOSIS — S32591A Other specified fracture of right pubis, initial encounter for closed fracture: Secondary | ICD-10-CM | POA: Diagnosis not present

## 2017-07-21 DIAGNOSIS — M25552 Pain in left hip: Secondary | ICD-10-CM | POA: Diagnosis not present

## 2017-07-21 DIAGNOSIS — S32592A Other specified fracture of left pubis, initial encounter for closed fracture: Secondary | ICD-10-CM | POA: Diagnosis not present

## 2017-07-26 ENCOUNTER — Telehealth: Payer: Self-pay | Admitting: *Deleted

## 2017-07-26 NOTE — Telephone Encounter (Signed)
LVM requesting call back, re: she has not picked up Nuplazid Rx form Jan. [facility] needs her to call.

## 2017-07-28 ENCOUNTER — Encounter: Payer: Self-pay | Admitting: *Deleted

## 2017-07-28 NOTE — Telephone Encounter (Signed)
Mailed letter explaining ot patient that she needs to call [facility] re: Nuplazid prescription. Also included copy of letter this RN received from [facility].

## 2017-08-03 DIAGNOSIS — F0390 Unspecified dementia without behavioral disturbance: Secondary | ICD-10-CM | POA: Diagnosis not present

## 2017-08-03 DIAGNOSIS — R531 Weakness: Secondary | ICD-10-CM | POA: Diagnosis not present

## 2017-08-03 DIAGNOSIS — F329 Major depressive disorder, single episode, unspecified: Secondary | ICD-10-CM | POA: Diagnosis not present

## 2017-08-03 DIAGNOSIS — G20 Parkinson's disease: Secondary | ICD-10-CM | POA: Diagnosis not present

## 2017-08-10 DIAGNOSIS — S066X0A Traumatic subarachnoid hemorrhage without loss of consciousness, initial encounter: Secondary | ICD-10-CM | POA: Diagnosis not present

## 2017-08-10 DIAGNOSIS — S065X9A Traumatic subdural hemorrhage with loss of consciousness of unspecified duration, initial encounter: Secondary | ICD-10-CM | POA: Diagnosis not present

## 2017-08-10 DIAGNOSIS — G20 Parkinson's disease: Secondary | ICD-10-CM | POA: Diagnosis not present

## 2017-08-10 DIAGNOSIS — S32810A Multiple fractures of pelvis with stable disruption of pelvic ring, initial encounter for closed fracture: Secondary | ICD-10-CM | POA: Diagnosis not present

## 2017-08-10 DIAGNOSIS — S32511D Fracture of superior rim of right pubis, subsequent encounter for fracture with routine healing: Secondary | ICD-10-CM | POA: Diagnosis not present

## 2017-08-10 DIAGNOSIS — W19XXXD Unspecified fall, subsequent encounter: Secondary | ICD-10-CM | POA: Diagnosis not present

## 2017-08-10 DIAGNOSIS — R402413 Glasgow coma scale score 13-15, at hospital admission: Secondary | ICD-10-CM | POA: Diagnosis not present

## 2017-08-10 DIAGNOSIS — E039 Hypothyroidism, unspecified: Secondary | ICD-10-CM | POA: Diagnosis not present

## 2017-08-10 DIAGNOSIS — R51 Headache: Secondary | ICD-10-CM | POA: Diagnosis present

## 2017-08-10 DIAGNOSIS — F0280 Dementia in other diseases classified elsewhere without behavioral disturbance: Secondary | ICD-10-CM | POA: Diagnosis not present

## 2017-08-10 DIAGNOSIS — A0472 Enterocolitis due to Clostridium difficile, not specified as recurrent: Secondary | ICD-10-CM | POA: Diagnosis not present

## 2017-08-10 DIAGNOSIS — R451 Restlessness and agitation: Secondary | ICD-10-CM | POA: Diagnosis present

## 2017-08-10 DIAGNOSIS — T1490XA Injury, unspecified, initial encounter: Secondary | ICD-10-CM | POA: Diagnosis not present

## 2017-08-10 DIAGNOSIS — K219 Gastro-esophageal reflux disease without esophagitis: Secondary | ICD-10-CM | POA: Diagnosis not present

## 2017-08-10 DIAGNOSIS — E440 Moderate protein-calorie malnutrition: Secondary | ICD-10-CM | POA: Diagnosis not present

## 2017-08-10 DIAGNOSIS — M545 Low back pain: Secondary | ICD-10-CM | POA: Diagnosis not present

## 2017-08-10 DIAGNOSIS — Z681 Body mass index (BMI) 19 or less, adult: Secondary | ICD-10-CM | POA: Diagnosis not present

## 2017-08-10 DIAGNOSIS — Z743 Need for continuous supervision: Secondary | ICD-10-CM | POA: Diagnosis not present

## 2017-08-10 DIAGNOSIS — F0390 Unspecified dementia without behavioral disturbance: Secondary | ICD-10-CM | POA: Diagnosis not present

## 2017-08-10 DIAGNOSIS — F329 Major depressive disorder, single episode, unspecified: Secondary | ICD-10-CM | POA: Diagnosis not present

## 2017-08-10 DIAGNOSIS — R2689 Other abnormalities of gait and mobility: Secondary | ICD-10-CM | POA: Diagnosis not present

## 2017-08-10 DIAGNOSIS — W19XXXS Unspecified fall, sequela: Secondary | ICD-10-CM | POA: Diagnosis not present

## 2017-08-10 DIAGNOSIS — I609 Nontraumatic subarachnoid hemorrhage, unspecified: Secondary | ICD-10-CM | POA: Diagnosis not present

## 2017-08-10 DIAGNOSIS — Z7951 Long term (current) use of inhaled steroids: Secondary | ICD-10-CM | POA: Diagnosis not present

## 2017-08-10 DIAGNOSIS — W19XXXA Unspecified fall, initial encounter: Secondary | ICD-10-CM | POA: Diagnosis not present

## 2017-08-10 DIAGNOSIS — G309 Alzheimer's disease, unspecified: Secondary | ICD-10-CM | POA: Diagnosis not present

## 2017-08-10 DIAGNOSIS — S80921A Unspecified superficial injury of right lower leg, initial encounter: Secondary | ICD-10-CM | POA: Diagnosis not present

## 2017-08-10 DIAGNOSIS — J449 Chronic obstructive pulmonary disease, unspecified: Secondary | ICD-10-CM | POA: Diagnosis not present

## 2017-08-10 DIAGNOSIS — Z86718 Personal history of other venous thrombosis and embolism: Secondary | ICD-10-CM | POA: Diagnosis not present

## 2017-08-10 DIAGNOSIS — S0101XA Laceration without foreign body of scalp, initial encounter: Secondary | ICD-10-CM | POA: Diagnosis not present

## 2017-08-10 DIAGNOSIS — S0990XA Unspecified injury of head, initial encounter: Secondary | ICD-10-CM | POA: Diagnosis not present

## 2017-08-10 DIAGNOSIS — S06360A Traumatic hemorrhage of cerebrum, unspecified, without loss of consciousness, initial encounter: Secondary | ICD-10-CM | POA: Diagnosis not present

## 2017-08-10 DIAGNOSIS — S0993XA Unspecified injury of face, initial encounter: Secondary | ICD-10-CM | POA: Diagnosis not present

## 2017-08-10 DIAGNOSIS — R0602 Shortness of breath: Secondary | ICD-10-CM | POA: Diagnosis not present

## 2017-08-12 DIAGNOSIS — E440 Moderate protein-calorie malnutrition: Secondary | ICD-10-CM | POA: Diagnosis not present

## 2017-08-12 DIAGNOSIS — R2689 Other abnormalities of gait and mobility: Secondary | ICD-10-CM | POA: Diagnosis not present

## 2017-08-12 DIAGNOSIS — S0990XA Unspecified injury of head, initial encounter: Secondary | ICD-10-CM | POA: Diagnosis not present

## 2017-08-12 DIAGNOSIS — R402413 Glasgow coma scale score 13-15, at hospital admission: Secondary | ICD-10-CM | POA: Diagnosis not present

## 2017-08-12 DIAGNOSIS — E039 Hypothyroidism, unspecified: Secondary | ICD-10-CM | POA: Diagnosis not present

## 2017-08-12 DIAGNOSIS — J449 Chronic obstructive pulmonary disease, unspecified: Secondary | ICD-10-CM | POA: Diagnosis not present

## 2017-08-12 DIAGNOSIS — W19XXXA Unspecified fall, initial encounter: Secondary | ICD-10-CM | POA: Diagnosis not present

## 2017-08-12 DIAGNOSIS — S065X9A Traumatic subdural hemorrhage with loss of consciousness of unspecified duration, initial encounter: Secondary | ICD-10-CM | POA: Diagnosis not present

## 2017-08-12 DIAGNOSIS — A0472 Enterocolitis due to Clostridium difficile, not specified as recurrent: Secondary | ICD-10-CM | POA: Diagnosis not present

## 2017-08-12 DIAGNOSIS — W19XXXS Unspecified fall, sequela: Secondary | ICD-10-CM | POA: Diagnosis not present

## 2017-08-12 DIAGNOSIS — S066X0A Traumatic subarachnoid hemorrhage without loss of consciousness, initial encounter: Secondary | ICD-10-CM | POA: Diagnosis not present

## 2017-08-12 DIAGNOSIS — W19XXXD Unspecified fall, subsequent encounter: Secondary | ICD-10-CM | POA: Diagnosis not present

## 2017-08-12 DIAGNOSIS — K219 Gastro-esophageal reflux disease without esophagitis: Secondary | ICD-10-CM | POA: Diagnosis not present

## 2017-08-12 DIAGNOSIS — F329 Major depressive disorder, single episode, unspecified: Secondary | ICD-10-CM | POA: Diagnosis not present

## 2017-08-12 DIAGNOSIS — S06360A Traumatic hemorrhage of cerebrum, unspecified, without loss of consciousness, initial encounter: Secondary | ICD-10-CM | POA: Diagnosis not present

## 2017-08-12 DIAGNOSIS — S32810A Multiple fractures of pelvis with stable disruption of pelvic ring, initial encounter for closed fracture: Secondary | ICD-10-CM | POA: Diagnosis not present

## 2017-08-12 DIAGNOSIS — F0390 Unspecified dementia without behavioral disturbance: Secondary | ICD-10-CM | POA: Diagnosis not present

## 2017-08-12 DIAGNOSIS — E038 Other specified hypothyroidism: Secondary | ICD-10-CM | POA: Diagnosis not present

## 2017-08-12 DIAGNOSIS — Z681 Body mass index (BMI) 19 or less, adult: Secondary | ICD-10-CM | POA: Diagnosis not present

## 2017-08-12 DIAGNOSIS — Z7951 Long term (current) use of inhaled steroids: Secondary | ICD-10-CM | POA: Diagnosis not present

## 2017-08-12 DIAGNOSIS — R0602 Shortness of breath: Secondary | ICD-10-CM | POA: Diagnosis not present

## 2017-08-12 DIAGNOSIS — F0280 Dementia in other diseases classified elsewhere without behavioral disturbance: Secondary | ICD-10-CM | POA: Diagnosis not present

## 2017-08-12 DIAGNOSIS — Z86718 Personal history of other venous thrombosis and embolism: Secondary | ICD-10-CM | POA: Diagnosis not present

## 2017-08-12 DIAGNOSIS — G20 Parkinson's disease: Secondary | ICD-10-CM | POA: Diagnosis not present

## 2017-08-12 DIAGNOSIS — F332 Major depressive disorder, recurrent severe without psychotic features: Secondary | ICD-10-CM | POA: Diagnosis not present

## 2017-08-12 DIAGNOSIS — G309 Alzheimer's disease, unspecified: Secondary | ICD-10-CM | POA: Diagnosis not present

## 2017-08-12 DIAGNOSIS — F3342 Major depressive disorder, recurrent, in full remission: Secondary | ICD-10-CM | POA: Diagnosis not present

## 2017-08-27 DIAGNOSIS — F332 Major depressive disorder, recurrent severe without psychotic features: Secondary | ICD-10-CM | POA: Diagnosis not present

## 2017-09-05 DIAGNOSIS — F3342 Major depressive disorder, recurrent, in full remission: Secondary | ICD-10-CM | POA: Diagnosis not present

## 2017-09-05 DIAGNOSIS — F0390 Unspecified dementia without behavioral disturbance: Secondary | ICD-10-CM | POA: Diagnosis not present

## 2017-09-05 DIAGNOSIS — E038 Other specified hypothyroidism: Secondary | ICD-10-CM | POA: Diagnosis not present

## 2017-09-05 DIAGNOSIS — G20 Parkinson's disease: Secondary | ICD-10-CM | POA: Diagnosis not present

## 2017-09-15 DIAGNOSIS — F0390 Unspecified dementia without behavioral disturbance: Secondary | ICD-10-CM | POA: Diagnosis not present

## 2017-09-15 DIAGNOSIS — M6281 Muscle weakness (generalized): Secondary | ICD-10-CM | POA: Diagnosis not present

## 2017-09-15 DIAGNOSIS — E039 Hypothyroidism, unspecified: Secondary | ICD-10-CM | POA: Diagnosis not present

## 2017-09-23 DIAGNOSIS — M25571 Pain in right ankle and joints of right foot: Secondary | ICD-10-CM | POA: Diagnosis not present

## 2017-09-23 DIAGNOSIS — M79671 Pain in right foot: Secondary | ICD-10-CM | POA: Diagnosis not present

## 2017-09-27 DIAGNOSIS — S32591D Other specified fracture of right pubis, subsequent encounter for fracture with routine healing: Secondary | ICD-10-CM | POA: Diagnosis not present

## 2017-09-27 DIAGNOSIS — R4182 Altered mental status, unspecified: Secondary | ICD-10-CM | POA: Diagnosis not present

## 2017-09-27 DIAGNOSIS — K219 Gastro-esophageal reflux disease without esophagitis: Secondary | ICD-10-CM | POA: Diagnosis not present

## 2017-09-27 DIAGNOSIS — Z86718 Personal history of other venous thrombosis and embolism: Secondary | ICD-10-CM | POA: Diagnosis not present

## 2017-09-27 DIAGNOSIS — J449 Chronic obstructive pulmonary disease, unspecified: Secondary | ICD-10-CM | POA: Diagnosis not present

## 2017-09-27 DIAGNOSIS — W19XXXS Unspecified fall, sequela: Secondary | ICD-10-CM | POA: Diagnosis not present

## 2017-09-27 DIAGNOSIS — W19XXXA Unspecified fall, initial encounter: Secondary | ICD-10-CM | POA: Diagnosis not present

## 2017-09-27 DIAGNOSIS — R32 Unspecified urinary incontinence: Secondary | ICD-10-CM | POA: Diagnosis not present

## 2017-09-27 DIAGNOSIS — M6281 Muscle weakness (generalized): Secondary | ICD-10-CM | POA: Diagnosis not present

## 2017-09-27 DIAGNOSIS — S32592S Other specified fracture of left pubis, sequela: Secondary | ICD-10-CM | POA: Diagnosis not present

## 2017-09-27 DIAGNOSIS — E038 Other specified hypothyroidism: Secondary | ICD-10-CM | POA: Diagnosis not present

## 2017-09-27 DIAGNOSIS — W19XXXD Unspecified fall, subsequent encounter: Secondary | ICD-10-CM | POA: Diagnosis not present

## 2017-09-27 DIAGNOSIS — E039 Hypothyroidism, unspecified: Secondary | ICD-10-CM | POA: Diagnosis not present

## 2017-09-27 DIAGNOSIS — S32591S Other specified fracture of right pubis, sequela: Secondary | ICD-10-CM | POA: Diagnosis not present

## 2017-09-27 DIAGNOSIS — F332 Major depressive disorder, recurrent severe without psychotic features: Secondary | ICD-10-CM | POA: Diagnosis not present

## 2017-09-27 DIAGNOSIS — S06360A Traumatic hemorrhage of cerebrum, unspecified, without loss of consciousness, initial encounter: Secondary | ICD-10-CM | POA: Diagnosis not present

## 2017-09-27 DIAGNOSIS — Z7901 Long term (current) use of anticoagulants: Secondary | ICD-10-CM | POA: Diagnosis not present

## 2017-09-27 DIAGNOSIS — G20 Parkinson's disease: Secondary | ICD-10-CM | POA: Diagnosis not present

## 2017-09-27 DIAGNOSIS — R2689 Other abnormalities of gait and mobility: Secondary | ICD-10-CM | POA: Diagnosis not present

## 2017-09-27 DIAGNOSIS — S32810A Multiple fractures of pelvis with stable disruption of pelvic ring, initial encounter for closed fracture: Secondary | ICD-10-CM | POA: Diagnosis not present

## 2017-09-27 DIAGNOSIS — R0602 Shortness of breath: Secondary | ICD-10-CM | POA: Diagnosis not present

## 2017-09-27 DIAGNOSIS — Z743 Need for continuous supervision: Secondary | ICD-10-CM | POA: Diagnosis not present

## 2017-09-27 DIAGNOSIS — F3342 Major depressive disorder, recurrent, in full remission: Secondary | ICD-10-CM | POA: Diagnosis not present

## 2017-09-27 DIAGNOSIS — S32592D Other specified fracture of left pubis, subsequent encounter for fracture with routine healing: Secondary | ICD-10-CM | POA: Diagnosis not present

## 2017-09-27 DIAGNOSIS — F329 Major depressive disorder, single episode, unspecified: Secondary | ICD-10-CM | POA: Diagnosis not present

## 2017-09-27 DIAGNOSIS — G309 Alzheimer's disease, unspecified: Secondary | ICD-10-CM | POA: Diagnosis not present

## 2017-09-27 DIAGNOSIS — A0472 Enterocolitis due to Clostridium difficile, not specified as recurrent: Secondary | ICD-10-CM | POA: Diagnosis not present

## 2017-09-27 DIAGNOSIS — F0390 Unspecified dementia without behavioral disturbance: Secondary | ICD-10-CM | POA: Diagnosis not present

## 2017-09-27 DIAGNOSIS — F0281 Dementia in other diseases classified elsewhere with behavioral disturbance: Secondary | ICD-10-CM | POA: Diagnosis not present

## 2017-09-28 DIAGNOSIS — F3342 Major depressive disorder, recurrent, in full remission: Secondary | ICD-10-CM | POA: Diagnosis not present

## 2017-09-28 DIAGNOSIS — M6281 Muscle weakness (generalized): Secondary | ICD-10-CM | POA: Diagnosis not present

## 2017-09-28 DIAGNOSIS — E038 Other specified hypothyroidism: Secondary | ICD-10-CM | POA: Diagnosis not present

## 2017-09-28 DIAGNOSIS — F0390 Unspecified dementia without behavioral disturbance: Secondary | ICD-10-CM | POA: Diagnosis not present

## 2017-09-30 DIAGNOSIS — S32592D Other specified fracture of left pubis, subsequent encounter for fracture with routine healing: Secondary | ICD-10-CM | POA: Diagnosis not present

## 2017-09-30 DIAGNOSIS — S32591S Other specified fracture of right pubis, sequela: Secondary | ICD-10-CM | POA: Diagnosis not present

## 2017-09-30 DIAGNOSIS — S32591D Other specified fracture of right pubis, subsequent encounter for fracture with routine healing: Secondary | ICD-10-CM | POA: Diagnosis not present

## 2017-09-30 DIAGNOSIS — S32592S Other specified fracture of left pubis, sequela: Secondary | ICD-10-CM | POA: Diagnosis not present

## 2017-10-09 DIAGNOSIS — F332 Major depressive disorder, recurrent severe without psychotic features: Secondary | ICD-10-CM | POA: Diagnosis not present

## 2017-10-13 DIAGNOSIS — F0281 Dementia in other diseases classified elsewhere with behavioral disturbance: Secondary | ICD-10-CM | POA: Diagnosis not present

## 2017-10-13 DIAGNOSIS — G309 Alzheimer's disease, unspecified: Secondary | ICD-10-CM | POA: Diagnosis not present

## 2017-10-13 DIAGNOSIS — G20 Parkinson's disease: Secondary | ICD-10-CM | POA: Diagnosis not present

## 2017-10-13 DIAGNOSIS — J449 Chronic obstructive pulmonary disease, unspecified: Secondary | ICD-10-CM | POA: Diagnosis not present

## 2017-10-13 DIAGNOSIS — R32 Unspecified urinary incontinence: Secondary | ICD-10-CM | POA: Diagnosis not present

## 2017-10-13 DIAGNOSIS — E039 Hypothyroidism, unspecified: Secondary | ICD-10-CM | POA: Diagnosis not present

## 2017-10-31 DIAGNOSIS — F3342 Major depressive disorder, recurrent, in full remission: Secondary | ICD-10-CM | POA: Diagnosis not present

## 2017-10-31 DIAGNOSIS — F0390 Unspecified dementia without behavioral disturbance: Secondary | ICD-10-CM | POA: Diagnosis not present

## 2017-10-31 DIAGNOSIS — E039 Hypothyroidism, unspecified: Secondary | ICD-10-CM | POA: Diagnosis not present

## 2017-10-31 DIAGNOSIS — M792 Neuralgia and neuritis, unspecified: Secondary | ICD-10-CM | POA: Diagnosis not present

## 2017-11-19 DIAGNOSIS — F332 Major depressive disorder, recurrent severe without psychotic features: Secondary | ICD-10-CM | POA: Diagnosis not present

## 2017-11-30 DIAGNOSIS — F329 Major depressive disorder, single episode, unspecified: Secondary | ICD-10-CM | POA: Diagnosis not present

## 2017-11-30 DIAGNOSIS — G20 Parkinson's disease: Secondary | ICD-10-CM | POA: Diagnosis not present

## 2017-11-30 DIAGNOSIS — E039 Hypothyroidism, unspecified: Secondary | ICD-10-CM | POA: Diagnosis not present

## 2017-12-01 DIAGNOSIS — F0390 Unspecified dementia without behavioral disturbance: Secondary | ICD-10-CM | POA: Diagnosis not present

## 2017-12-01 DIAGNOSIS — R41841 Cognitive communication deficit: Secondary | ICD-10-CM | POA: Diagnosis not present

## 2017-12-02 DIAGNOSIS — F0390 Unspecified dementia without behavioral disturbance: Secondary | ICD-10-CM | POA: Diagnosis not present

## 2017-12-02 DIAGNOSIS — R41841 Cognitive communication deficit: Secondary | ICD-10-CM | POA: Diagnosis not present

## 2017-12-05 DIAGNOSIS — R41841 Cognitive communication deficit: Secondary | ICD-10-CM | POA: Diagnosis not present

## 2017-12-05 DIAGNOSIS — F0390 Unspecified dementia without behavioral disturbance: Secondary | ICD-10-CM | POA: Diagnosis not present

## 2017-12-06 DIAGNOSIS — E039 Hypothyroidism, unspecified: Secondary | ICD-10-CM | POA: Diagnosis not present

## 2017-12-06 DIAGNOSIS — F0390 Unspecified dementia without behavioral disturbance: Secondary | ICD-10-CM | POA: Diagnosis not present

## 2017-12-06 DIAGNOSIS — F0391 Unspecified dementia with behavioral disturbance: Secondary | ICD-10-CM | POA: Diagnosis not present

## 2017-12-06 DIAGNOSIS — R41841 Cognitive communication deficit: Secondary | ICD-10-CM | POA: Diagnosis not present

## 2017-12-06 DIAGNOSIS — F329 Major depressive disorder, single episode, unspecified: Secondary | ICD-10-CM | POA: Diagnosis not present

## 2017-12-06 DIAGNOSIS — G20 Parkinson's disease: Secondary | ICD-10-CM | POA: Diagnosis not present

## 2017-12-07 DIAGNOSIS — R41841 Cognitive communication deficit: Secondary | ICD-10-CM | POA: Diagnosis not present

## 2017-12-07 DIAGNOSIS — F0390 Unspecified dementia without behavioral disturbance: Secondary | ICD-10-CM | POA: Diagnosis not present

## 2017-12-08 DIAGNOSIS — R41841 Cognitive communication deficit: Secondary | ICD-10-CM | POA: Diagnosis not present

## 2017-12-08 DIAGNOSIS — F0390 Unspecified dementia without behavioral disturbance: Secondary | ICD-10-CM | POA: Diagnosis not present

## 2017-12-09 DIAGNOSIS — R41841 Cognitive communication deficit: Secondary | ICD-10-CM | POA: Diagnosis not present

## 2017-12-09 DIAGNOSIS — F0390 Unspecified dementia without behavioral disturbance: Secondary | ICD-10-CM | POA: Diagnosis not present

## 2017-12-12 DIAGNOSIS — F0390 Unspecified dementia without behavioral disturbance: Secondary | ICD-10-CM | POA: Diagnosis not present

## 2017-12-12 DIAGNOSIS — R41841 Cognitive communication deficit: Secondary | ICD-10-CM | POA: Diagnosis not present

## 2017-12-13 DIAGNOSIS — F0390 Unspecified dementia without behavioral disturbance: Secondary | ICD-10-CM | POA: Diagnosis not present

## 2017-12-13 DIAGNOSIS — R41841 Cognitive communication deficit: Secondary | ICD-10-CM | POA: Diagnosis not present

## 2017-12-14 DIAGNOSIS — F0390 Unspecified dementia without behavioral disturbance: Secondary | ICD-10-CM | POA: Diagnosis not present

## 2017-12-14 DIAGNOSIS — R41841 Cognitive communication deficit: Secondary | ICD-10-CM | POA: Diagnosis not present

## 2017-12-15 DIAGNOSIS — F0390 Unspecified dementia without behavioral disturbance: Secondary | ICD-10-CM | POA: Diagnosis not present

## 2017-12-15 DIAGNOSIS — R41841 Cognitive communication deficit: Secondary | ICD-10-CM | POA: Diagnosis not present

## 2017-12-16 DIAGNOSIS — F0390 Unspecified dementia without behavioral disturbance: Secondary | ICD-10-CM | POA: Diagnosis not present

## 2017-12-16 DIAGNOSIS — R41841 Cognitive communication deficit: Secondary | ICD-10-CM | POA: Diagnosis not present

## 2017-12-17 DIAGNOSIS — B351 Tinea unguium: Secondary | ICD-10-CM | POA: Diagnosis not present

## 2017-12-17 DIAGNOSIS — I7389 Other specified peripheral vascular diseases: Secondary | ICD-10-CM | POA: Diagnosis not present

## 2017-12-19 DIAGNOSIS — F0390 Unspecified dementia without behavioral disturbance: Secondary | ICD-10-CM | POA: Diagnosis not present

## 2017-12-19 DIAGNOSIS — R41841 Cognitive communication deficit: Secondary | ICD-10-CM | POA: Diagnosis not present

## 2017-12-20 DIAGNOSIS — F0390 Unspecified dementia without behavioral disturbance: Secondary | ICD-10-CM | POA: Diagnosis not present

## 2017-12-20 DIAGNOSIS — R41841 Cognitive communication deficit: Secondary | ICD-10-CM | POA: Diagnosis not present

## 2017-12-21 DIAGNOSIS — R41841 Cognitive communication deficit: Secondary | ICD-10-CM | POA: Diagnosis not present

## 2017-12-21 DIAGNOSIS — F0390 Unspecified dementia without behavioral disturbance: Secondary | ICD-10-CM | POA: Diagnosis not present

## 2017-12-22 DIAGNOSIS — R41841 Cognitive communication deficit: Secondary | ICD-10-CM | POA: Diagnosis not present

## 2017-12-22 DIAGNOSIS — F0390 Unspecified dementia without behavioral disturbance: Secondary | ICD-10-CM | POA: Diagnosis not present

## 2017-12-23 DIAGNOSIS — F0390 Unspecified dementia without behavioral disturbance: Secondary | ICD-10-CM | POA: Diagnosis not present

## 2017-12-23 DIAGNOSIS — R41841 Cognitive communication deficit: Secondary | ICD-10-CM | POA: Diagnosis not present

## 2017-12-26 DIAGNOSIS — F0390 Unspecified dementia without behavioral disturbance: Secondary | ICD-10-CM | POA: Diagnosis not present

## 2017-12-26 DIAGNOSIS — R41841 Cognitive communication deficit: Secondary | ICD-10-CM | POA: Diagnosis not present

## 2018-01-01 DIAGNOSIS — R05 Cough: Secondary | ICD-10-CM | POA: Diagnosis not present

## 2018-01-02 DIAGNOSIS — Z8619 Personal history of other infectious and parasitic diseases: Secondary | ICD-10-CM | POA: Diagnosis not present

## 2018-01-02 DIAGNOSIS — Z86718 Personal history of other venous thrombosis and embolism: Secondary | ICD-10-CM | POA: Diagnosis not present

## 2018-01-02 DIAGNOSIS — R05 Cough: Secondary | ICD-10-CM | POA: Diagnosis not present

## 2018-01-02 DIAGNOSIS — F329 Major depressive disorder, single episode, unspecified: Secondary | ICD-10-CM | POA: Diagnosis present

## 2018-01-02 DIAGNOSIS — N10 Acute tubulo-interstitial nephritis: Secondary | ICD-10-CM | POA: Diagnosis not present

## 2018-01-02 DIAGNOSIS — R918 Other nonspecific abnormal finding of lung field: Secondary | ICD-10-CM | POA: Diagnosis not present

## 2018-01-02 DIAGNOSIS — K219 Gastro-esophageal reflux disease without esophagitis: Secondary | ICD-10-CM | POA: Diagnosis not present

## 2018-01-02 DIAGNOSIS — R404 Transient alteration of awareness: Secondary | ICD-10-CM | POA: Diagnosis not present

## 2018-01-02 DIAGNOSIS — Z743 Need for continuous supervision: Secondary | ICD-10-CM | POA: Diagnosis not present

## 2018-01-02 DIAGNOSIS — G309 Alzheimer's disease, unspecified: Secondary | ICD-10-CM | POA: Diagnosis not present

## 2018-01-02 DIAGNOSIS — W19XXXA Unspecified fall, initial encounter: Secondary | ICD-10-CM | POA: Diagnosis not present

## 2018-01-02 DIAGNOSIS — G20 Parkinson's disease: Secondary | ICD-10-CM | POA: Diagnosis not present

## 2018-01-02 DIAGNOSIS — R531 Weakness: Secondary | ICD-10-CM | POA: Diagnosis not present

## 2018-01-02 DIAGNOSIS — F0280 Dementia in other diseases classified elsewhere without behavioral disturbance: Secondary | ICD-10-CM | POA: Diagnosis not present

## 2018-01-02 DIAGNOSIS — J9601 Acute respiratory failure with hypoxia: Secondary | ICD-10-CM | POA: Diagnosis not present

## 2018-01-02 DIAGNOSIS — Z7951 Long term (current) use of inhaled steroids: Secondary | ICD-10-CM | POA: Diagnosis not present

## 2018-01-02 DIAGNOSIS — F0390 Unspecified dementia without behavioral disturbance: Secondary | ICD-10-CM | POA: Diagnosis not present

## 2018-01-02 DIAGNOSIS — R410 Disorientation, unspecified: Secondary | ICD-10-CM | POA: Diagnosis not present

## 2018-01-02 DIAGNOSIS — R2689 Other abnormalities of gait and mobility: Secondary | ICD-10-CM | POA: Diagnosis not present

## 2018-01-02 DIAGNOSIS — I959 Hypotension, unspecified: Secondary | ICD-10-CM | POA: Diagnosis not present

## 2018-01-02 DIAGNOSIS — J90 Pleural effusion, not elsewhere classified: Secondary | ICD-10-CM | POA: Diagnosis not present

## 2018-01-02 DIAGNOSIS — S06360A Traumatic hemorrhage of cerebrum, unspecified, without loss of consciousness, initial encounter: Secondary | ICD-10-CM | POA: Diagnosis not present

## 2018-01-02 DIAGNOSIS — R0989 Other specified symptoms and signs involving the circulatory and respiratory systems: Secondary | ICD-10-CM | POA: Diagnosis not present

## 2018-01-02 DIAGNOSIS — W19XXXS Unspecified fall, sequela: Secondary | ICD-10-CM | POA: Diagnosis not present

## 2018-01-02 DIAGNOSIS — R400 Somnolence: Secondary | ICD-10-CM | POA: Diagnosis not present

## 2018-01-02 DIAGNOSIS — G9341 Metabolic encephalopathy: Secondary | ICD-10-CM | POA: Diagnosis not present

## 2018-01-02 DIAGNOSIS — J181 Lobar pneumonia, unspecified organism: Secondary | ICD-10-CM | POA: Diagnosis not present

## 2018-01-02 DIAGNOSIS — R5381 Other malaise: Secondary | ICD-10-CM | POA: Diagnosis not present

## 2018-01-02 DIAGNOSIS — A0472 Enterocolitis due to Clostridium difficile, not specified as recurrent: Secondary | ICD-10-CM | POA: Diagnosis not present

## 2018-01-02 DIAGNOSIS — G9340 Encephalopathy, unspecified: Secondary | ICD-10-CM | POA: Diagnosis not present

## 2018-01-02 DIAGNOSIS — R0602 Shortness of breath: Secondary | ICD-10-CM | POA: Diagnosis not present

## 2018-01-02 DIAGNOSIS — R509 Fever, unspecified: Secondary | ICD-10-CM | POA: Diagnosis not present

## 2018-01-02 DIAGNOSIS — E876 Hypokalemia: Secondary | ICD-10-CM | POA: Diagnosis not present

## 2018-01-02 DIAGNOSIS — W19XXXD Unspecified fall, subsequent encounter: Secondary | ICD-10-CM | POA: Diagnosis not present

## 2018-01-02 DIAGNOSIS — Z993 Dependence on wheelchair: Secondary | ICD-10-CM | POA: Diagnosis not present

## 2018-01-02 DIAGNOSIS — R4182 Altered mental status, unspecified: Secondary | ICD-10-CM | POA: Diagnosis not present

## 2018-01-02 DIAGNOSIS — K5180 Other ulcerative colitis without complications: Secondary | ICD-10-CM | POA: Diagnosis not present

## 2018-01-02 DIAGNOSIS — J440 Chronic obstructive pulmonary disease with acute lower respiratory infection: Secondary | ICD-10-CM | POA: Diagnosis not present

## 2018-01-02 DIAGNOSIS — N390 Urinary tract infection, site not specified: Secondary | ICD-10-CM | POA: Diagnosis not present

## 2018-01-02 DIAGNOSIS — E860 Dehydration: Secondary | ICD-10-CM | POA: Diagnosis not present

## 2018-01-02 DIAGNOSIS — J189 Pneumonia, unspecified organism: Secondary | ICD-10-CM | POA: Diagnosis not present

## 2018-01-02 DIAGNOSIS — S32810A Multiple fractures of pelvis with stable disruption of pelvic ring, initial encounter for closed fracture: Secondary | ICD-10-CM | POA: Diagnosis not present

## 2018-01-02 DIAGNOSIS — K5641 Fecal impaction: Secondary | ICD-10-CM | POA: Diagnosis not present

## 2018-01-02 DIAGNOSIS — E039 Hypothyroidism, unspecified: Secondary | ICD-10-CM | POA: Diagnosis not present

## 2018-01-09 DIAGNOSIS — W19XXXD Unspecified fall, subsequent encounter: Secondary | ICD-10-CM | POA: Diagnosis not present

## 2018-01-09 DIAGNOSIS — G309 Alzheimer's disease, unspecified: Secondary | ICD-10-CM | POA: Diagnosis not present

## 2018-01-09 DIAGNOSIS — Z743 Need for continuous supervision: Secondary | ICD-10-CM | POA: Diagnosis not present

## 2018-01-09 DIAGNOSIS — J9601 Acute respiratory failure with hypoxia: Secondary | ICD-10-CM | POA: Diagnosis not present

## 2018-01-09 DIAGNOSIS — G20 Parkinson's disease: Secondary | ICD-10-CM | POA: Diagnosis not present

## 2018-01-09 DIAGNOSIS — W19XXXA Unspecified fall, initial encounter: Secondary | ICD-10-CM | POA: Diagnosis not present

## 2018-01-09 DIAGNOSIS — J440 Chronic obstructive pulmonary disease with acute lower respiratory infection: Secondary | ICD-10-CM | POA: Diagnosis not present

## 2018-01-09 DIAGNOSIS — N10 Acute tubulo-interstitial nephritis: Secondary | ICD-10-CM | POA: Diagnosis not present

## 2018-01-09 DIAGNOSIS — J181 Lobar pneumonia, unspecified organism: Secondary | ICD-10-CM | POA: Diagnosis not present

## 2018-01-09 DIAGNOSIS — A0472 Enterocolitis due to Clostridium difficile, not specified as recurrent: Secondary | ICD-10-CM | POA: Diagnosis not present

## 2018-01-09 DIAGNOSIS — E039 Hypothyroidism, unspecified: Secondary | ICD-10-CM | POA: Diagnosis not present

## 2018-01-09 DIAGNOSIS — K219 Gastro-esophageal reflux disease without esophagitis: Secondary | ICD-10-CM | POA: Diagnosis not present

## 2018-01-09 DIAGNOSIS — F329 Major depressive disorder, single episode, unspecified: Secondary | ICD-10-CM | POA: Diagnosis not present

## 2018-01-09 DIAGNOSIS — R2689 Other abnormalities of gait and mobility: Secondary | ICD-10-CM | POA: Diagnosis not present

## 2018-01-09 DIAGNOSIS — F0280 Dementia in other diseases classified elsewhere without behavioral disturbance: Secondary | ICD-10-CM | POA: Diagnosis not present

## 2018-01-09 DIAGNOSIS — G9340 Encephalopathy, unspecified: Secondary | ICD-10-CM | POA: Diagnosis not present

## 2018-01-09 DIAGNOSIS — W19XXXS Unspecified fall, sequela: Secondary | ICD-10-CM | POA: Diagnosis not present

## 2018-01-09 DIAGNOSIS — F0390 Unspecified dementia without behavioral disturbance: Secondary | ICD-10-CM | POA: Diagnosis not present

## 2018-01-09 DIAGNOSIS — K5641 Fecal impaction: Secondary | ICD-10-CM | POA: Diagnosis not present

## 2018-01-09 DIAGNOSIS — S32810A Multiple fractures of pelvis with stable disruption of pelvic ring, initial encounter for closed fracture: Secondary | ICD-10-CM | POA: Diagnosis not present

## 2018-01-09 DIAGNOSIS — N390 Urinary tract infection, site not specified: Secondary | ICD-10-CM | POA: Diagnosis not present

## 2018-01-09 DIAGNOSIS — S06360A Traumatic hemorrhage of cerebrum, unspecified, without loss of consciousness, initial encounter: Secondary | ICD-10-CM | POA: Diagnosis not present

## 2018-01-09 DIAGNOSIS — R0602 Shortness of breath: Secondary | ICD-10-CM | POA: Diagnosis not present

## 2018-01-09 DIAGNOSIS — E876 Hypokalemia: Secondary | ICD-10-CM | POA: Diagnosis not present

## 2018-02-13 DIAGNOSIS — F329 Major depressive disorder, single episode, unspecified: Secondary | ICD-10-CM | POA: Diagnosis not present

## 2018-02-13 DIAGNOSIS — F0390 Unspecified dementia without behavioral disturbance: Secondary | ICD-10-CM | POA: Diagnosis not present

## 2018-02-13 DIAGNOSIS — K219 Gastro-esophageal reflux disease without esophagitis: Secondary | ICD-10-CM | POA: Diagnosis not present

## 2018-02-13 DIAGNOSIS — G20 Parkinson's disease: Secondary | ICD-10-CM | POA: Diagnosis not present

## 2018-02-13 DIAGNOSIS — R5381 Other malaise: Secondary | ICD-10-CM | POA: Diagnosis not present

## 2018-02-14 DIAGNOSIS — J168 Pneumonia due to other specified infectious organisms: Secondary | ICD-10-CM | POA: Diagnosis not present

## 2018-02-18 DIAGNOSIS — B351 Tinea unguium: Secondary | ICD-10-CM | POA: Diagnosis not present

## 2018-02-18 DIAGNOSIS — I7389 Other specified peripheral vascular diseases: Secondary | ICD-10-CM | POA: Diagnosis not present

## 2018-02-27 DIAGNOSIS — R5381 Other malaise: Secondary | ICD-10-CM | POA: Diagnosis not present

## 2018-02-28 DIAGNOSIS — F0390 Unspecified dementia without behavioral disturbance: Secondary | ICD-10-CM | POA: Diagnosis not present

## 2018-03-01 DIAGNOSIS — F0390 Unspecified dementia without behavioral disturbance: Secondary | ICD-10-CM | POA: Diagnosis not present

## 2018-03-02 DIAGNOSIS — F0390 Unspecified dementia without behavioral disturbance: Secondary | ICD-10-CM | POA: Diagnosis not present

## 2018-03-03 DIAGNOSIS — F0390 Unspecified dementia without behavioral disturbance: Secondary | ICD-10-CM | POA: Diagnosis not present

## 2018-03-06 DIAGNOSIS — F0390 Unspecified dementia without behavioral disturbance: Secondary | ICD-10-CM | POA: Diagnosis not present

## 2018-03-06 DIAGNOSIS — K210 Gastro-esophageal reflux disease with esophagitis: Secondary | ICD-10-CM | POA: Diagnosis not present

## 2018-03-06 DIAGNOSIS — F0391 Unspecified dementia with behavioral disturbance: Secondary | ICD-10-CM | POA: Diagnosis not present

## 2018-03-06 DIAGNOSIS — E039 Hypothyroidism, unspecified: Secondary | ICD-10-CM | POA: Diagnosis not present

## 2018-03-06 DIAGNOSIS — G20 Parkinson's disease: Secondary | ICD-10-CM | POA: Diagnosis not present

## 2018-03-07 DIAGNOSIS — F0391 Unspecified dementia with behavioral disturbance: Secondary | ICD-10-CM | POA: Diagnosis not present

## 2018-03-07 DIAGNOSIS — F0390 Unspecified dementia without behavioral disturbance: Secondary | ICD-10-CM | POA: Diagnosis not present

## 2018-03-07 DIAGNOSIS — M6281 Muscle weakness (generalized): Secondary | ICD-10-CM | POA: Diagnosis not present

## 2018-03-08 DIAGNOSIS — F0390 Unspecified dementia without behavioral disturbance: Secondary | ICD-10-CM | POA: Diagnosis not present

## 2018-03-09 DIAGNOSIS — F0390 Unspecified dementia without behavioral disturbance: Secondary | ICD-10-CM | POA: Diagnosis not present

## 2018-03-10 DIAGNOSIS — F0390 Unspecified dementia without behavioral disturbance: Secondary | ICD-10-CM | POA: Diagnosis not present

## 2018-03-13 DIAGNOSIS — F0390 Unspecified dementia without behavioral disturbance: Secondary | ICD-10-CM | POA: Diagnosis not present

## 2018-03-19 DIAGNOSIS — R443 Hallucinations, unspecified: Secondary | ICD-10-CM | POA: Diagnosis not present

## 2018-03-19 DIAGNOSIS — R451 Restlessness and agitation: Secondary | ICD-10-CM | POA: Diagnosis not present

## 2018-03-19 DIAGNOSIS — R531 Weakness: Secondary | ICD-10-CM | POA: Diagnosis not present

## 2018-03-19 DIAGNOSIS — Z79899 Other long term (current) drug therapy: Secondary | ICD-10-CM | POA: Diagnosis not present

## 2018-03-19 DIAGNOSIS — Z743 Need for continuous supervision: Secondary | ICD-10-CM | POA: Diagnosis not present

## 2018-03-19 DIAGNOSIS — R410 Disorientation, unspecified: Secondary | ICD-10-CM | POA: Diagnosis not present

## 2018-03-19 DIAGNOSIS — G20 Parkinson's disease: Secondary | ICD-10-CM | POA: Diagnosis not present

## 2018-03-19 DIAGNOSIS — F0281 Dementia in other diseases classified elsewhere with behavioral disturbance: Secondary | ICD-10-CM | POA: Diagnosis not present

## 2018-03-28 IMAGING — CT CT CHEST LUNG CANCER SCREENING LOW DOSE W/O CM
2 of 4 series · 15 of 40 positions shown, 18 images · non-contrast
Comparison: Low-dose lung cancer screening chest CT 01/15/2016.

CLINICAL DATA: 76-year-old female former smoker (quit in 1750) with
30 pack-year history of smoking. Lung cancer screening examination.

EXAM:
CT CHEST WITHOUT CONTRAST LOW-DOSE FOR LUNG CANCER SCREENING
TECHNIQUE: Multidetector CT imaging of the chest was performed following the
standard protocol without IV contrast.

[Series 2: thorax 5.0 i31f 3 · axial · 0.67mm/px · z∈[-302,-47]mm · 12 of 61 slices shown, 15 images]
[im 5/61  mediastinal]
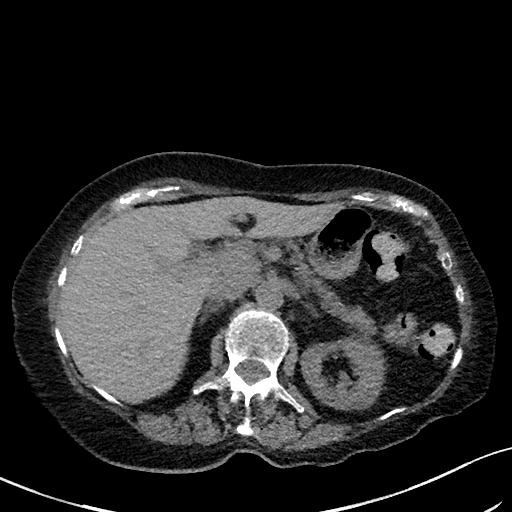
[im 5/61  lung]
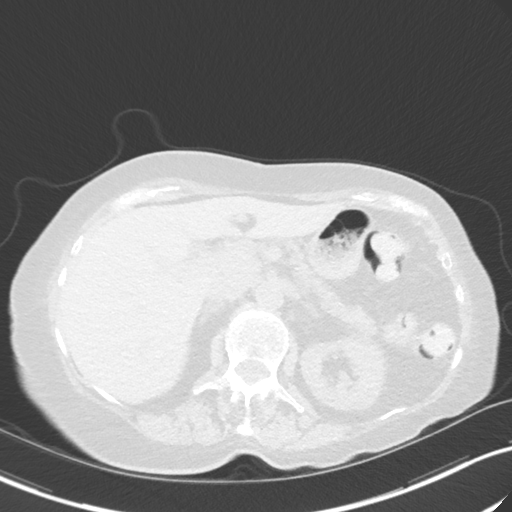
[im 10/61  lung]
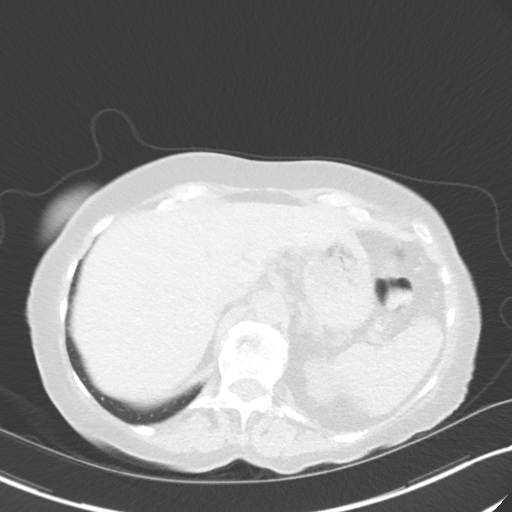
[im 14/61  lung]
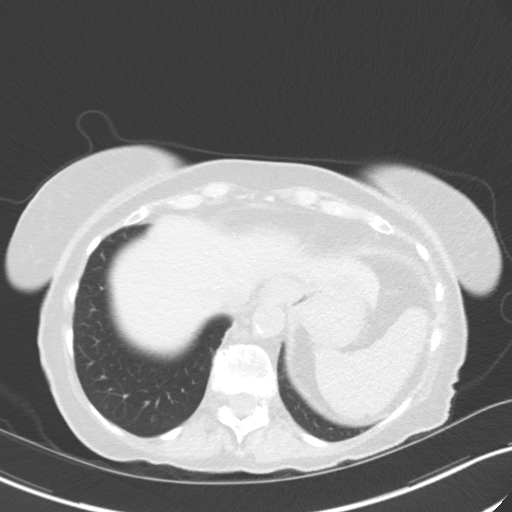
[im 19/61  lung]
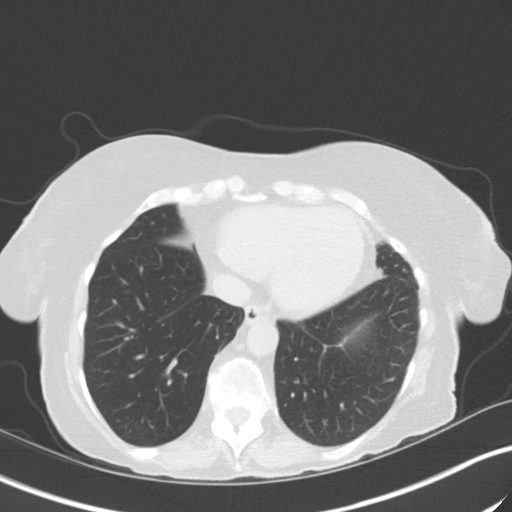
[im 24/61  mediastinal]
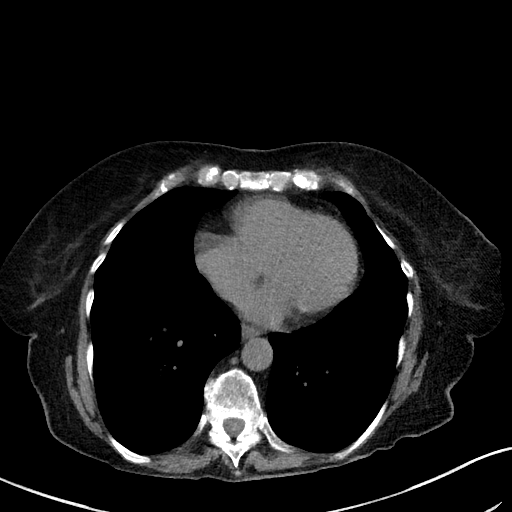
[im 24/61  lung]
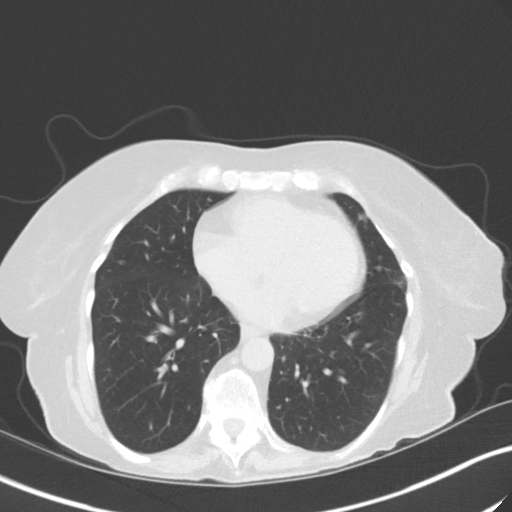
[im 28/61  lung]
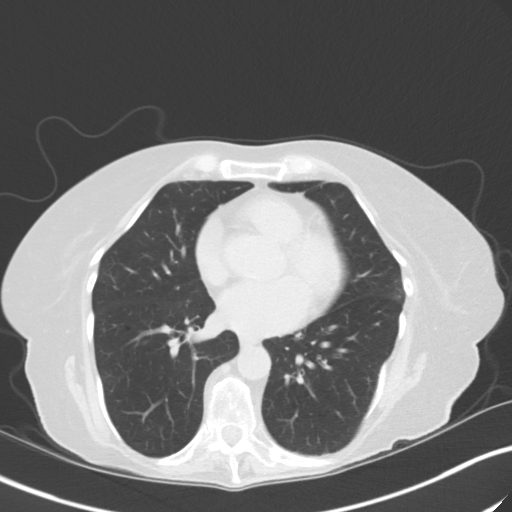
[im 33/61  lung]
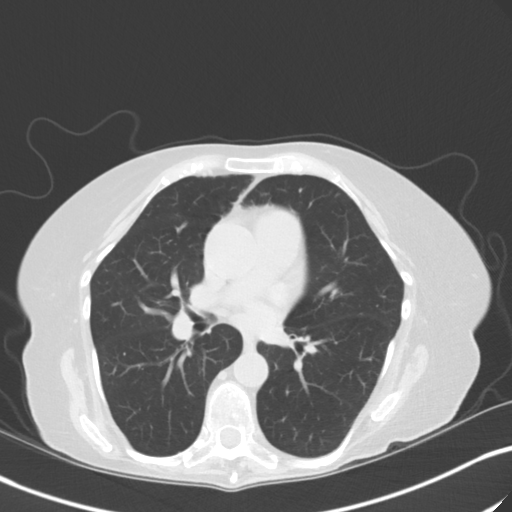
[im 37/61  lung]
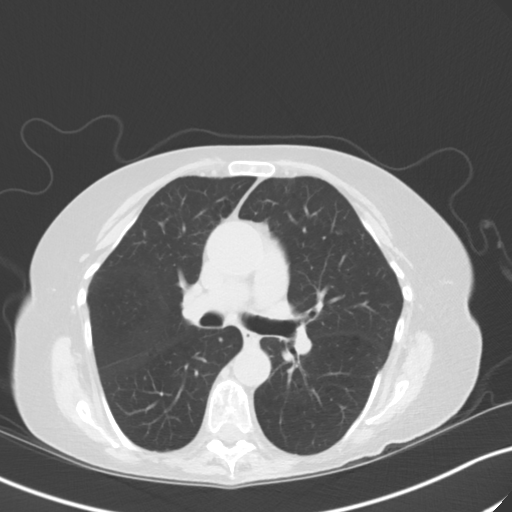
[im 42/61  mediastinal]
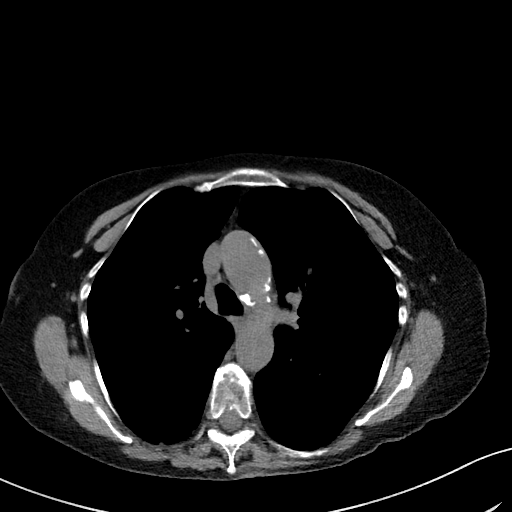
[im 42/61  lung]
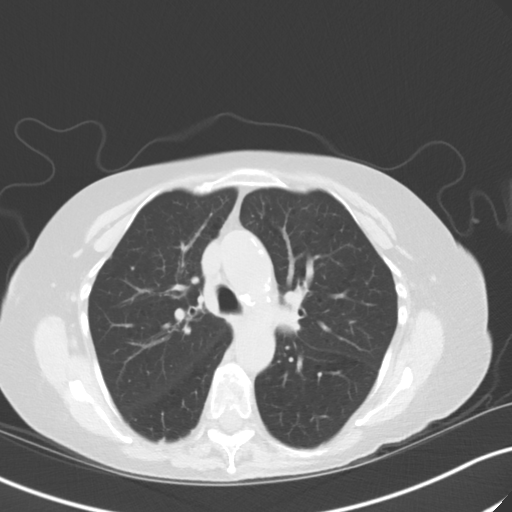
[im 47/61  lung]
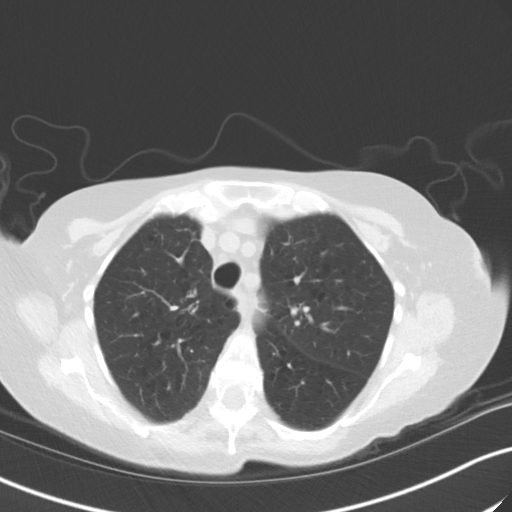
[im 51/61  lung]
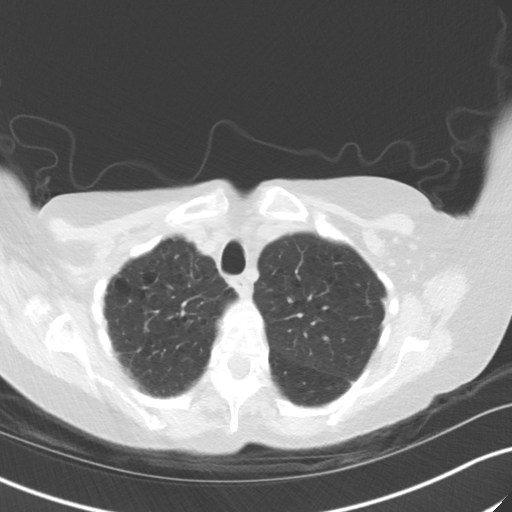
[im 56/61  lung]
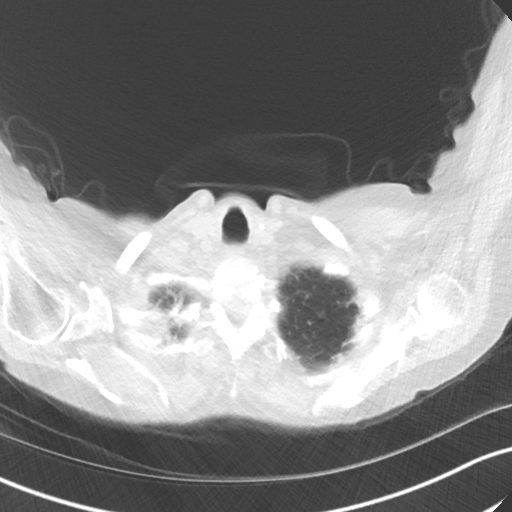

[Series 5: coronal · coronal · 0.59mm/px · 3 of 117 slices shown]
[im 24/117  lung]
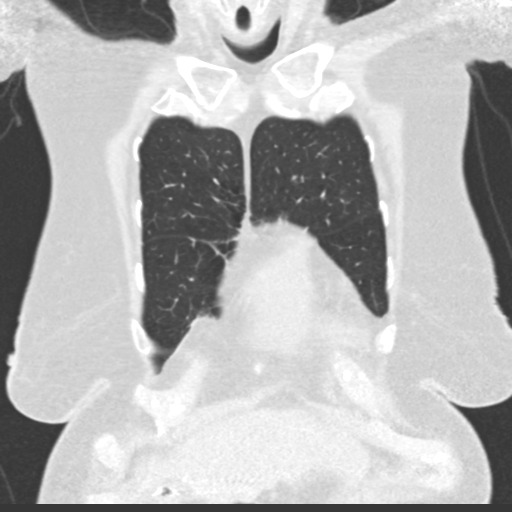
[im 47/117  lung]
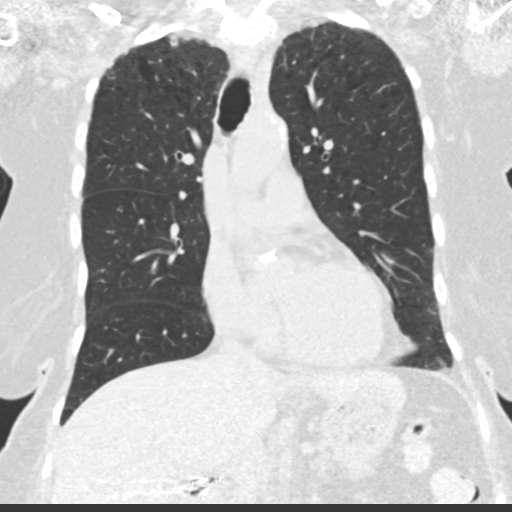
[im 70/117  lung]
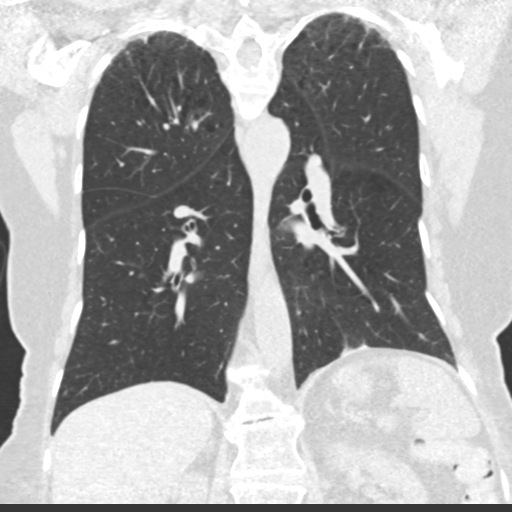

[15 of 40 positions shown; findings below may reference images not displayed]

FINDINGS: Cardiovascular: Heart size is normal. There is no significant
pericardial fluid, thickening or pericardial calcification. Aortic
atherosclerosis. No coronary artery calcifications.

Mediastinum/Nodes: No pathologically enlarged mediastinal or hilar
lymph nodes. Please note that accurate exclusion of hilar adenopathy
is limited on noncontrast CT scans. Esophagus is unremarkable in
appearance. No axillary lymphadenopathy.

Lungs/Pleura: Multiple tiny pulmonary nodules scattered throughout
the lungs bilaterally, similar to prior examinations, with the
largest of these in the periphery of the right upper lobe (axial
image 55 of series 3) with a volume derived mean diameter of 3.9 mm.
No larger more suspicious appearing pulmonary nodules or masses are
noted. No acute consolidative airspace disease. No pleural
effusions. Diffuse bronchial wall thickening with mild centrilobular
and paraseptal emphysema. Extensive bilateral apical nodular
pleuroparenchymal thickening, most compatible with chronic scarring,
similar to prior studies.

Upper Abdomen: Aortic atherosclerosis.  Status post cholecystectomy.

Musculoskeletal: There are no aggressive appearing lytic or blastic
lesions noted in the visualized portions of the skeleton.
IMPRESSION: 1. Lung-RADS 2, benign appearance or behavior. Continue annual
screening with low-dose chest CT without contrast in 12 months.
2. Mild diffuse bronchial wall thickening with mild centrilobular
and paraseptal emphysema; imaging findings suggestive of underlying
COPD.
3. Aortic atherosclerosis.

Aortic Atherosclerosis (5RY4G-2BG.G) and Emphysema (5RY4G-9TR.E).

## 2018-03-30 DIAGNOSIS — E039 Hypothyroidism, unspecified: Secondary | ICD-10-CM | POA: Diagnosis not present

## 2018-03-30 DIAGNOSIS — F329 Major depressive disorder, single episode, unspecified: Secondary | ICD-10-CM | POA: Diagnosis not present

## 2018-03-30 DIAGNOSIS — F0390 Unspecified dementia without behavioral disturbance: Secondary | ICD-10-CM | POA: Diagnosis not present

## 2018-03-30 DIAGNOSIS — G20 Parkinson's disease: Secondary | ICD-10-CM | POA: Diagnosis not present

## 2018-04-12 DIAGNOSIS — R5381 Other malaise: Secondary | ICD-10-CM | POA: Diagnosis not present

## 2018-04-29 DIAGNOSIS — I7389 Other specified peripheral vascular diseases: Secondary | ICD-10-CM | POA: Diagnosis not present

## 2018-04-29 DIAGNOSIS — B351 Tinea unguium: Secondary | ICD-10-CM | POA: Diagnosis not present

## 2018-05-03 DIAGNOSIS — E039 Hypothyroidism, unspecified: Secondary | ICD-10-CM | POA: Diagnosis not present

## 2018-05-03 DIAGNOSIS — F3340 Major depressive disorder, recurrent, in remission, unspecified: Secondary | ICD-10-CM | POA: Diagnosis not present

## 2018-05-03 DIAGNOSIS — F0390 Unspecified dementia without behavioral disturbance: Secondary | ICD-10-CM | POA: Diagnosis not present

## 2018-05-03 DIAGNOSIS — G20 Parkinson's disease: Secondary | ICD-10-CM | POA: Diagnosis not present

## 2018-06-02 DIAGNOSIS — R41841 Cognitive communication deficit: Secondary | ICD-10-CM | POA: Diagnosis not present

## 2018-06-02 DIAGNOSIS — M6281 Muscle weakness (generalized): Secondary | ICD-10-CM | POA: Diagnosis not present

## 2018-06-05 DIAGNOSIS — R41841 Cognitive communication deficit: Secondary | ICD-10-CM | POA: Diagnosis not present

## 2018-06-05 DIAGNOSIS — M6281 Muscle weakness (generalized): Secondary | ICD-10-CM | POA: Diagnosis not present

## 2018-06-06 DIAGNOSIS — R41841 Cognitive communication deficit: Secondary | ICD-10-CM | POA: Diagnosis not present

## 2018-06-06 DIAGNOSIS — M6281 Muscle weakness (generalized): Secondary | ICD-10-CM | POA: Diagnosis not present

## 2018-06-07 DIAGNOSIS — R41841 Cognitive communication deficit: Secondary | ICD-10-CM | POA: Diagnosis not present

## 2018-06-07 DIAGNOSIS — M6281 Muscle weakness (generalized): Secondary | ICD-10-CM | POA: Diagnosis not present

## 2018-06-08 DIAGNOSIS — R41841 Cognitive communication deficit: Secondary | ICD-10-CM | POA: Diagnosis not present

## 2018-06-08 DIAGNOSIS — M6281 Muscle weakness (generalized): Secondary | ICD-10-CM | POA: Diagnosis not present

## 2018-06-09 DIAGNOSIS — M6281 Muscle weakness (generalized): Secondary | ICD-10-CM | POA: Diagnosis not present

## 2018-06-09 DIAGNOSIS — R41841 Cognitive communication deficit: Secondary | ICD-10-CM | POA: Diagnosis not present

## 2018-06-12 DIAGNOSIS — G20 Parkinson's disease: Secondary | ICD-10-CM | POA: Diagnosis not present

## 2018-06-12 DIAGNOSIS — F3342 Major depressive disorder, recurrent, in full remission: Secondary | ICD-10-CM | POA: Diagnosis not present

## 2018-06-12 DIAGNOSIS — F419 Anxiety disorder, unspecified: Secondary | ICD-10-CM | POA: Diagnosis not present

## 2018-06-12 DIAGNOSIS — F0390 Unspecified dementia without behavioral disturbance: Secondary | ICD-10-CM | POA: Diagnosis not present

## 2018-06-13 DIAGNOSIS — R41841 Cognitive communication deficit: Secondary | ICD-10-CM | POA: Diagnosis not present

## 2018-06-13 DIAGNOSIS — M6281 Muscle weakness (generalized): Secondary | ICD-10-CM | POA: Diagnosis not present

## 2018-07-05 DIAGNOSIS — K219 Gastro-esophageal reflux disease without esophagitis: Secondary | ICD-10-CM | POA: Diagnosis not present

## 2018-07-05 DIAGNOSIS — G20 Parkinson's disease: Secondary | ICD-10-CM | POA: Diagnosis not present

## 2018-07-05 DIAGNOSIS — E039 Hypothyroidism, unspecified: Secondary | ICD-10-CM | POA: Diagnosis not present

## 2018-07-05 DIAGNOSIS — F419 Anxiety disorder, unspecified: Secondary | ICD-10-CM | POA: Diagnosis not present

## 2018-07-05 DIAGNOSIS — G301 Alzheimer's disease with late onset: Secondary | ICD-10-CM | POA: Diagnosis not present

## 2018-07-05 DIAGNOSIS — F0281 Dementia in other diseases classified elsewhere with behavioral disturbance: Secondary | ICD-10-CM | POA: Diagnosis not present

## 2018-07-05 DIAGNOSIS — F3342 Major depressive disorder, recurrent, in full remission: Secondary | ICD-10-CM | POA: Diagnosis not present

## 2018-07-09 DIAGNOSIS — I7389 Other specified peripheral vascular diseases: Secondary | ICD-10-CM | POA: Diagnosis not present

## 2018-07-09 DIAGNOSIS — B351 Tinea unguium: Secondary | ICD-10-CM | POA: Diagnosis not present

## 2018-07-09 DIAGNOSIS — L84 Corns and callosities: Secondary | ICD-10-CM | POA: Diagnosis not present

## 2018-07-13 DIAGNOSIS — R790 Abnormal level of blood mineral: Secondary | ICD-10-CM | POA: Diagnosis not present

## 2018-07-27 DIAGNOSIS — N390 Urinary tract infection, site not specified: Secondary | ICD-10-CM | POA: Diagnosis not present

## 2018-07-31 DIAGNOSIS — F3342 Major depressive disorder, recurrent, in full remission: Secondary | ICD-10-CM | POA: Diagnosis not present

## 2018-07-31 DIAGNOSIS — E039 Hypothyroidism, unspecified: Secondary | ICD-10-CM | POA: Diagnosis not present

## 2018-07-31 DIAGNOSIS — F419 Anxiety disorder, unspecified: Secondary | ICD-10-CM | POA: Diagnosis not present

## 2018-07-31 DIAGNOSIS — G20 Parkinson's disease: Secondary | ICD-10-CM | POA: Diagnosis not present

## 2018-07-31 DIAGNOSIS — F0281 Dementia in other diseases classified elsewhere with behavioral disturbance: Secondary | ICD-10-CM | POA: Diagnosis not present

## 2018-07-31 DIAGNOSIS — G301 Alzheimer's disease with late onset: Secondary | ICD-10-CM | POA: Diagnosis not present

## 2018-07-31 DIAGNOSIS — L89152 Pressure ulcer of sacral region, stage 2: Secondary | ICD-10-CM | POA: Diagnosis not present

## 2018-07-31 DIAGNOSIS — K219 Gastro-esophageal reflux disease without esophagitis: Secondary | ICD-10-CM | POA: Diagnosis not present

## 2018-08-23 DIAGNOSIS — F0281 Dementia in other diseases classified elsewhere with behavioral disturbance: Secondary | ICD-10-CM | POA: Diagnosis not present

## 2018-08-23 DIAGNOSIS — G301 Alzheimer's disease with late onset: Secondary | ICD-10-CM | POA: Diagnosis not present

## 2018-08-23 DIAGNOSIS — L89152 Pressure ulcer of sacral region, stage 2: Secondary | ICD-10-CM | POA: Diagnosis not present

## 2018-08-24 DIAGNOSIS — F0281 Dementia in other diseases classified elsewhere with behavioral disturbance: Secondary | ICD-10-CM | POA: Diagnosis not present

## 2018-08-24 DIAGNOSIS — G301 Alzheimer's disease with late onset: Secondary | ICD-10-CM | POA: Diagnosis not present

## 2018-08-24 DIAGNOSIS — R8290 Unspecified abnormal findings in urine: Secondary | ICD-10-CM | POA: Diagnosis not present

## 2018-08-24 DIAGNOSIS — G20 Parkinson's disease: Secondary | ICD-10-CM | POA: Diagnosis not present

## 2018-08-24 DIAGNOSIS — R5381 Other malaise: Secondary | ICD-10-CM | POA: Diagnosis not present

## 2018-08-24 DIAGNOSIS — L89152 Pressure ulcer of sacral region, stage 2: Secondary | ICD-10-CM | POA: Diagnosis not present

## 2018-08-28 DIAGNOSIS — F3342 Major depressive disorder, recurrent, in full remission: Secondary | ICD-10-CM | POA: Diagnosis not present

## 2018-08-28 DIAGNOSIS — G301 Alzheimer's disease with late onset: Secondary | ICD-10-CM | POA: Diagnosis not present

## 2018-08-28 DIAGNOSIS — L89152 Pressure ulcer of sacral region, stage 2: Secondary | ICD-10-CM | POA: Diagnosis not present

## 2018-08-28 DIAGNOSIS — F419 Anxiety disorder, unspecified: Secondary | ICD-10-CM | POA: Diagnosis not present

## 2018-08-28 DIAGNOSIS — G20 Parkinson's disease: Secondary | ICD-10-CM | POA: Diagnosis not present

## 2018-08-28 DIAGNOSIS — H1033 Unspecified acute conjunctivitis, bilateral: Secondary | ICD-10-CM | POA: Diagnosis not present

## 2018-08-28 DIAGNOSIS — E039 Hypothyroidism, unspecified: Secondary | ICD-10-CM | POA: Diagnosis not present

## 2018-08-28 DIAGNOSIS — F0281 Dementia in other diseases classified elsewhere with behavioral disturbance: Secondary | ICD-10-CM | POA: Diagnosis not present

## 2018-08-28 DIAGNOSIS — K219 Gastro-esophageal reflux disease without esophagitis: Secondary | ICD-10-CM | POA: Diagnosis not present

## 2019-04-19 IMAGING — US US EXTREM LOW VENOUS*L*
1 series · 13 of 24 positions shown · non-contrast
Comparison: None.

CLINICAL DATA: Left leg swelling.



[Series 1: us extrem low venous*left* · 0.07mm/px · 13 of 55 slices shown]
[im 1/55]
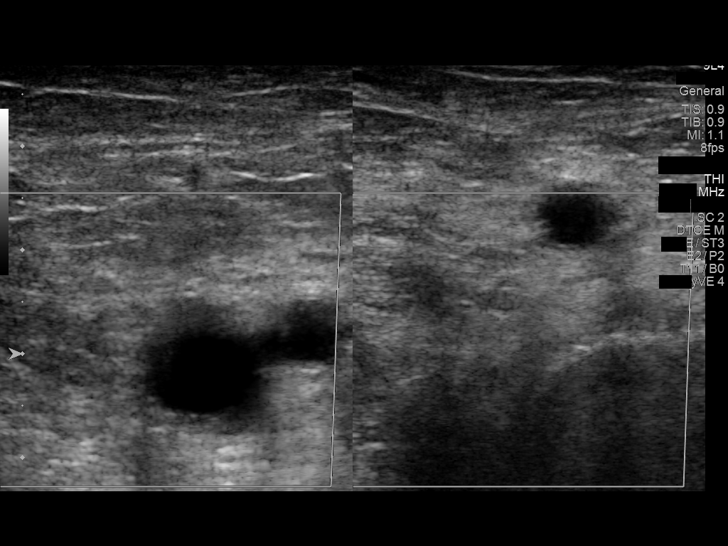
[im 5/55]
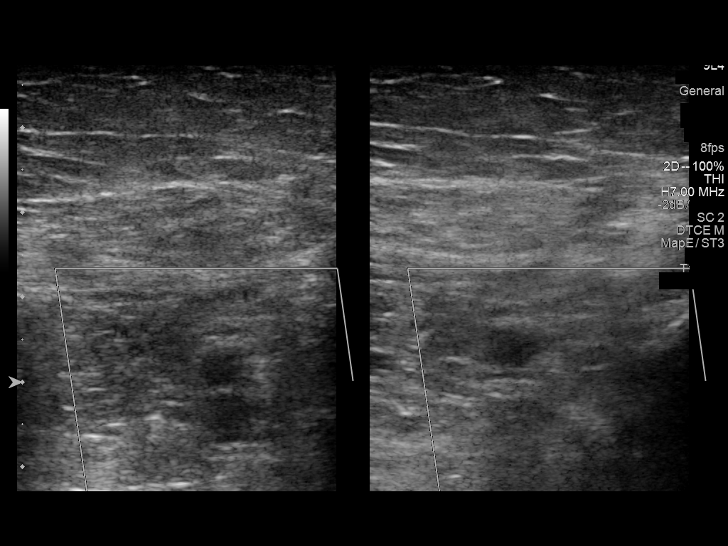
[im 10/55]
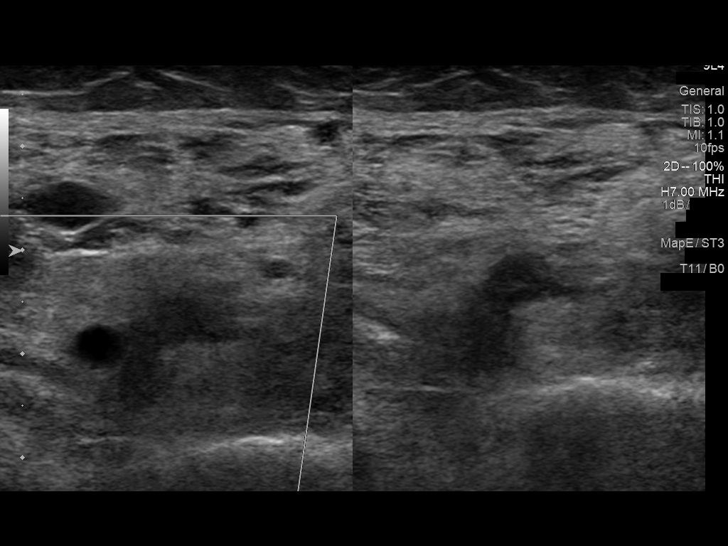
[im 15/55]
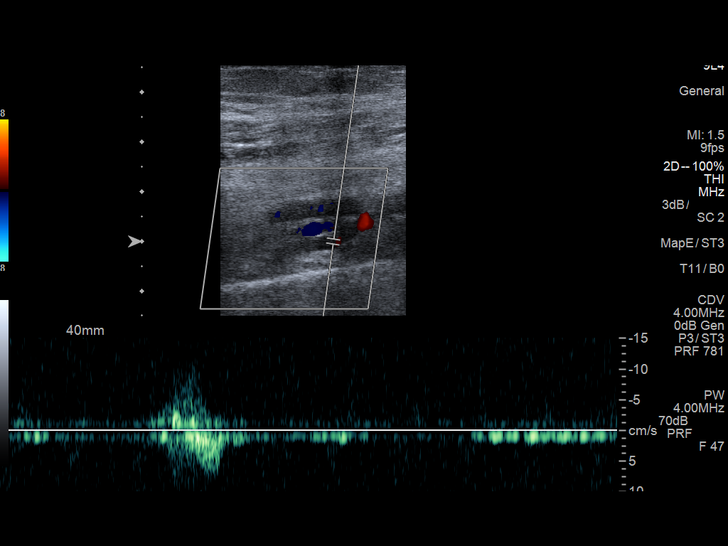
[im 19/55]
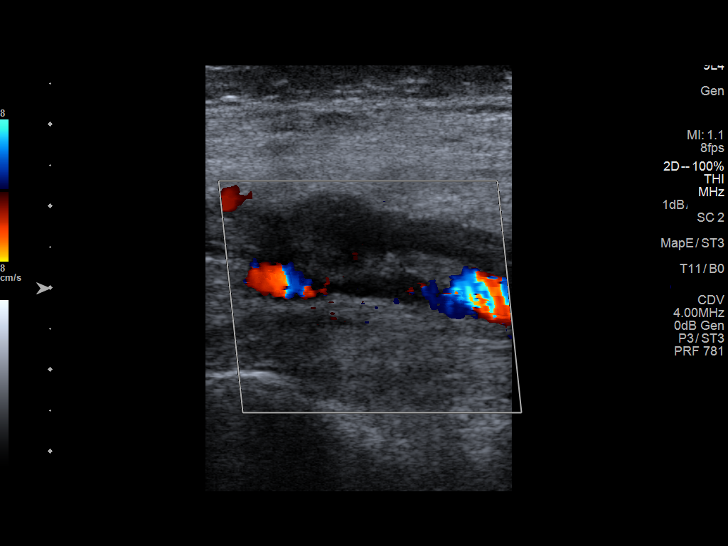
[im 24/55]
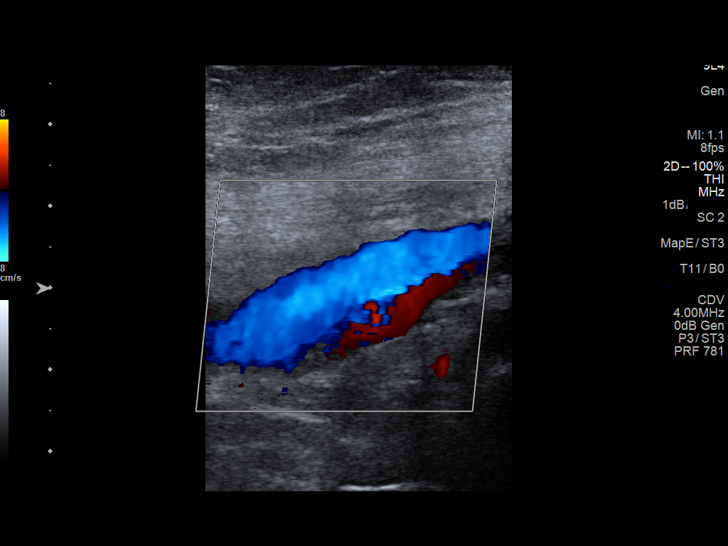
[im 29/55]
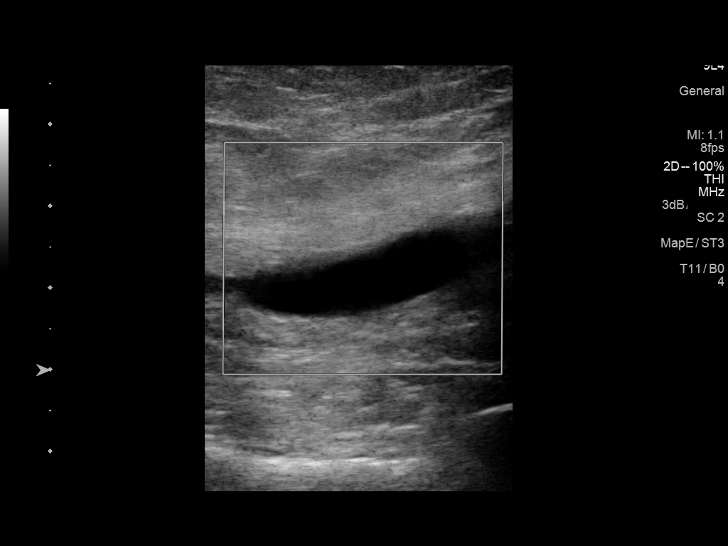
[im 31/55]
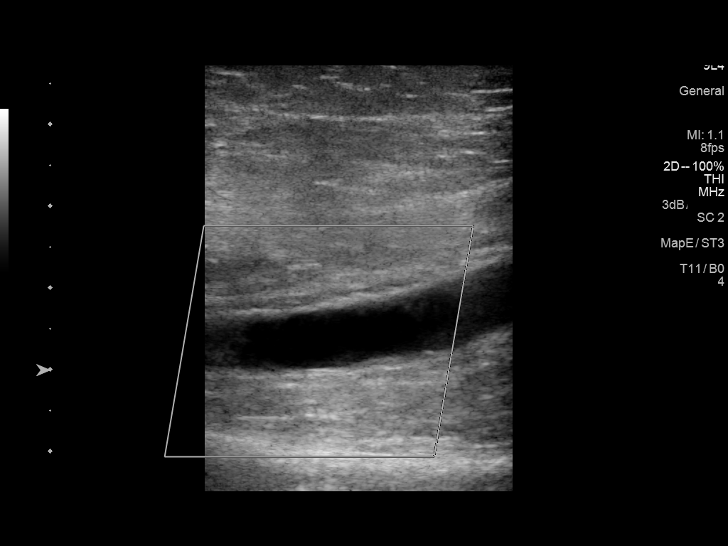
[im 36/55]
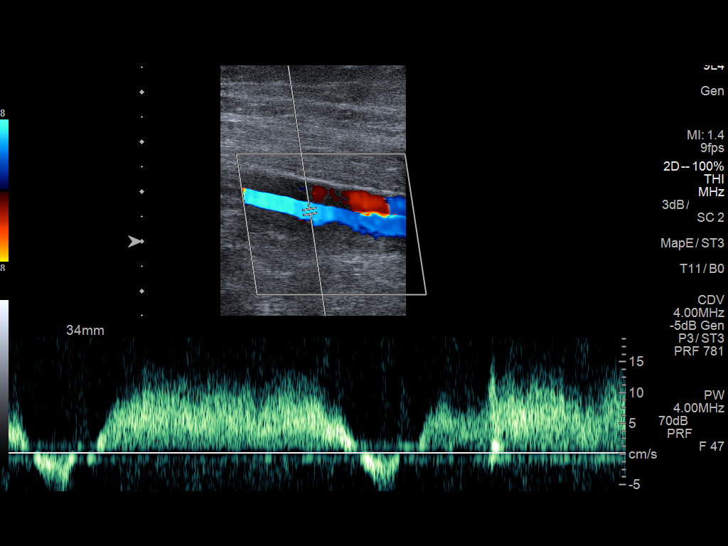
[im 40/55]
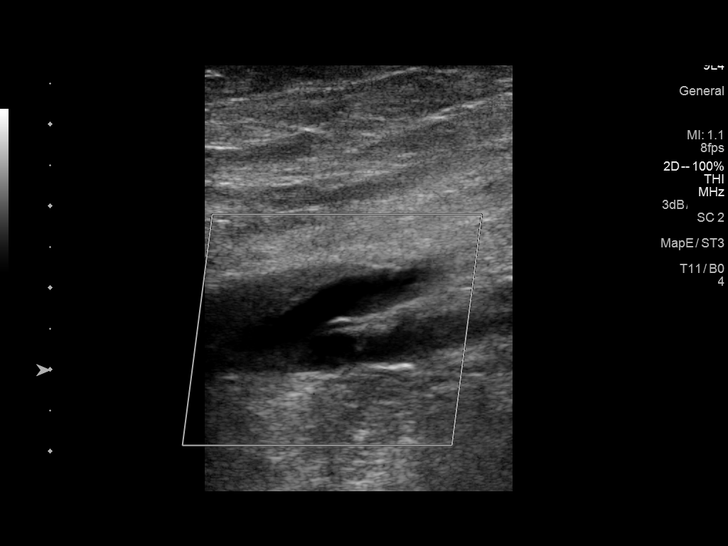
[im 45/55]
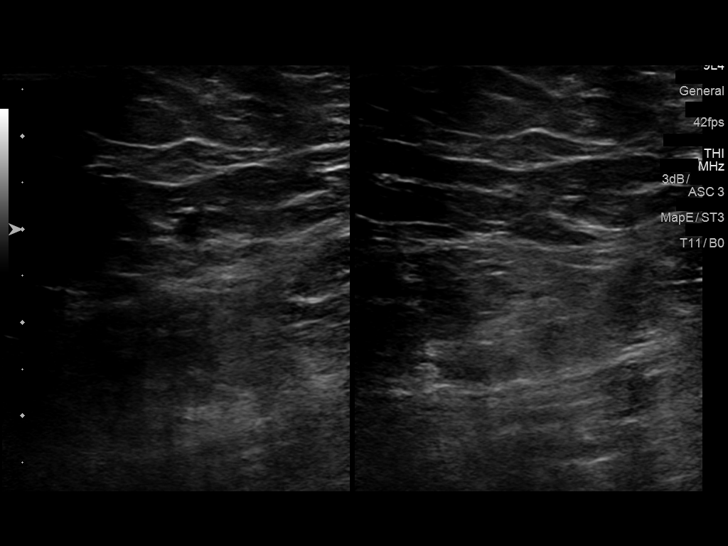
[im 50/55]
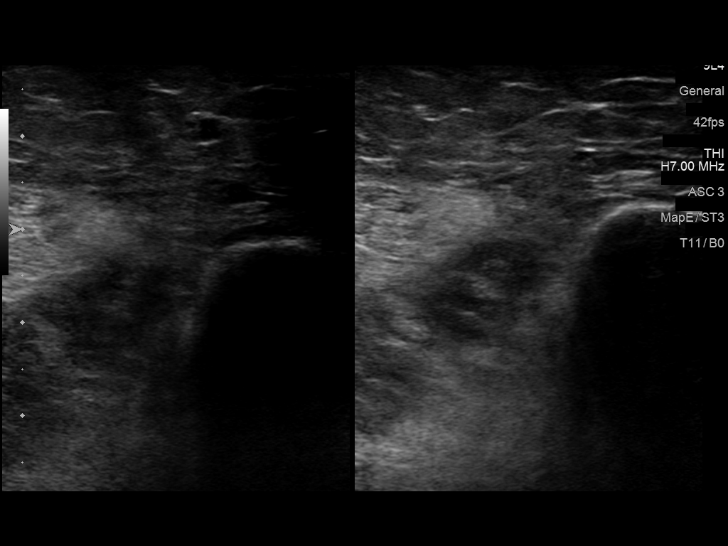
[im 55/55]
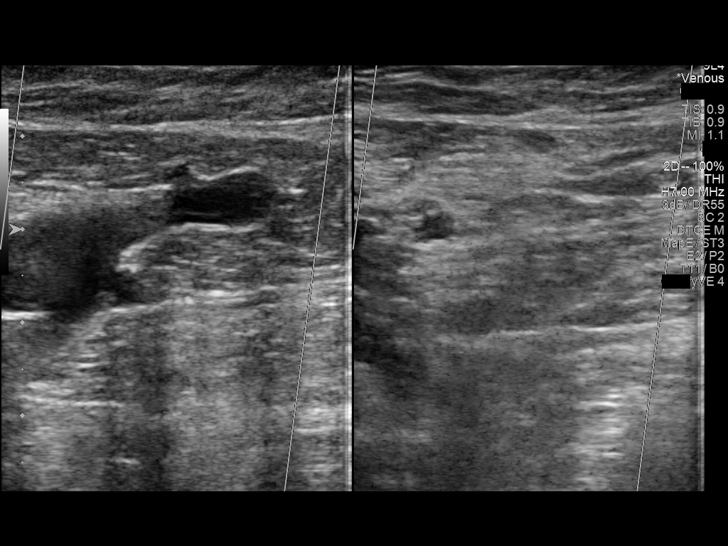

[13 of 24 positions shown; findings below may reference images not displayed]

FINDINGS: Contralateral Common Femoral Vein: Respiratory phasicity is normal
and symmetric with the symptomatic side. No evidence of thrombus.
Normal compressibility.

Common Femoral Vein: No evidence of thrombus. Normal
compressibility, respiratory phasicity and response to augmentation.

Saphenofemoral Junction: No evidence of thrombus. Normal
compressibility and flow on color Doppler imaging.

Profunda Femoral Vein: No evidence of thrombus. Normal
compressibility and flow on color Doppler imaging.

Femoral Vein: No evidence of thrombus. Normal compressibility,
respiratory phasicity and response to augmentation.

Popliteal Vein: No evidence of thrombus. Normal compressibility,
respiratory phasicity and response to augmentation.

Calf Veins: There is occlusive thrombus within the proximal
posterior tibial vein.

Superficial Great Saphenous Vein: No evidence of thrombus. Normal
compressibility and flow on color Doppler imaging.

Venous Reflux:  None.

Other Findings:  None.
IMPRESSION: Occlusive deep venous thrombosis of the left proximal posterior
tibial vein.

These results will be called to the ordering clinician or
representative by the Radiologist Assistant, and communication
documented in the PACS or zVision Dashboard.
